# Patient Record
Sex: Female | Born: 1940 | Race: Black or African American | Hispanic: No | Smoking: Former smoker
Health system: Southern US, Community
[De-identification: ages and names within clinical notes are randomized; demographics above are authoritative.]

## PROBLEM LIST (undated history)

## (undated) ENCOUNTER — Emergency Department (HOSPITAL_BASED_OUTPATIENT_CLINIC_OR_DEPARTMENT_OTHER): Admission: EM | Payer: Medicare Other | Source: Home / Self Care

## (undated) DIAGNOSIS — E041 Nontoxic single thyroid nodule: Secondary | ICD-10-CM

## (undated) DIAGNOSIS — M51369 Other intervertebral disc degeneration, lumbar region without mention of lumbar back pain or lower extremity pain: Secondary | ICD-10-CM

## (undated) DIAGNOSIS — E785 Hyperlipidemia, unspecified: Secondary | ICD-10-CM

## (undated) DIAGNOSIS — D169 Benign neoplasm of bone and articular cartilage, unspecified: Secondary | ICD-10-CM

## (undated) DIAGNOSIS — Z780 Asymptomatic menopausal state: Secondary | ICD-10-CM

## (undated) DIAGNOSIS — K2970 Gastritis, unspecified, without bleeding: Secondary | ICD-10-CM

## (undated) DIAGNOSIS — H269 Unspecified cataract: Secondary | ICD-10-CM

## (undated) DIAGNOSIS — K219 Gastro-esophageal reflux disease without esophagitis: Secondary | ICD-10-CM

## (undated) DIAGNOSIS — D329 Benign neoplasm of meninges, unspecified: Secondary | ICD-10-CM

## (undated) DIAGNOSIS — M5136 Other intervertebral disc degeneration, lumbar region: Secondary | ICD-10-CM

## (undated) DIAGNOSIS — T7840XA Allergy, unspecified, initial encounter: Secondary | ICD-10-CM

## (undated) DIAGNOSIS — I10 Essential (primary) hypertension: Secondary | ICD-10-CM

## (undated) HISTORY — PX: BREAST EXCISIONAL BIOPSY: SUR124

## (undated) HISTORY — DX: Benign neoplasm of bone and articular cartilage, unspecified: D16.9

## (undated) HISTORY — DX: Hyperlipidemia, unspecified: E78.5

## (undated) HISTORY — DX: Other intervertebral disc degeneration, lumbar region: M51.36

## (undated) HISTORY — DX: Essential (primary) hypertension: I10

## (undated) HISTORY — DX: Other intervertebral disc degeneration, lumbar region without mention of lumbar back pain or lower extremity pain: M51.369

## (undated) HISTORY — DX: Benign neoplasm of meninges, unspecified: D32.9

## (undated) HISTORY — DX: Asymptomatic menopausal state: Z78.0

## (undated) HISTORY — DX: Nontoxic single thyroid nodule: E04.1

## (undated) HISTORY — DX: Gastro-esophageal reflux disease without esophagitis: K21.9

## (undated) HISTORY — DX: Unspecified cataract: H26.9

## (undated) HISTORY — DX: Allergy, unspecified, initial encounter: T78.40XA

## (undated) HISTORY — PX: TUBAL LIGATION: SHX77

## (undated) HISTORY — PX: BREAST BIOPSY: SHX20

## (undated) HISTORY — DX: Gastritis, unspecified, without bleeding: K29.70

---

## 1967-12-06 HISTORY — PX: BREAST SURGERY: SHX581

## 2000-12-05 DIAGNOSIS — M51369 Other intervertebral disc degeneration, lumbar region without mention of lumbar back pain or lower extremity pain: Secondary | ICD-10-CM

## 2000-12-05 DIAGNOSIS — M5136 Other intervertebral disc degeneration, lumbar region: Secondary | ICD-10-CM

## 2000-12-05 HISTORY — DX: Other intervertebral disc degeneration, lumbar region: M51.36

## 2000-12-05 HISTORY — DX: Other intervertebral disc degeneration, lumbar region without mention of lumbar back pain or lower extremity pain: M51.369

## 2001-11-20 ENCOUNTER — Encounter: Payer: Self-pay | Admitting: Family Medicine

## 2008-02-11 ENCOUNTER — Ambulatory Visit: Payer: Self-pay | Admitting: Family Medicine

## 2008-02-11 DIAGNOSIS — I10 Essential (primary) hypertension: Secondary | ICD-10-CM | POA: Insufficient documentation

## 2008-02-12 ENCOUNTER — Encounter: Payer: Self-pay | Admitting: Family Medicine

## 2008-02-14 ENCOUNTER — Encounter: Payer: Self-pay | Admitting: Family Medicine

## 2008-02-14 LAB — CONVERTED CEMR LAB
AST: 19 units/L (ref 0–37)
CO2: 27 meq/L (ref 19–32)
Chloride: 106 meq/L (ref 96–112)
LDL Cholesterol: 114 mg/dL — ABNORMAL HIGH (ref 0–99)
Potassium: 4.2 meq/L (ref 3.5–5.3)
Sodium: 143 meq/L (ref 135–145)
VLDL: 19 mg/dL (ref 0–40)

## 2008-02-15 ENCOUNTER — Encounter: Payer: Self-pay | Admitting: Family Medicine

## 2008-02-26 ENCOUNTER — Ambulatory Visit: Payer: Self-pay | Admitting: Family Medicine

## 2008-02-26 ENCOUNTER — Encounter: Payer: Self-pay | Admitting: Family Medicine

## 2008-02-26 ENCOUNTER — Other Ambulatory Visit: Admission: RE | Admit: 2008-02-26 | Discharge: 2008-02-26 | Payer: Self-pay | Admitting: Family Medicine

## 2008-02-26 DIAGNOSIS — N951 Menopausal and female climacteric states: Secondary | ICD-10-CM

## 2008-03-07 ENCOUNTER — Encounter: Payer: Self-pay | Admitting: Family Medicine

## 2008-03-07 ENCOUNTER — Encounter: Admission: RE | Admit: 2008-03-07 | Discharge: 2008-03-07 | Payer: Self-pay | Admitting: Family Medicine

## 2008-03-13 ENCOUNTER — Encounter: Payer: Self-pay | Admitting: Family Medicine

## 2008-03-13 DIAGNOSIS — M48061 Spinal stenosis, lumbar region without neurogenic claudication: Secondary | ICD-10-CM | POA: Insufficient documentation

## 2008-03-13 DIAGNOSIS — L2089 Other atopic dermatitis: Secondary | ICD-10-CM

## 2008-03-18 ENCOUNTER — Ambulatory Visit: Payer: Self-pay | Admitting: Family Medicine

## 2008-03-18 DIAGNOSIS — M542 Cervicalgia: Secondary | ICD-10-CM

## 2008-03-19 ENCOUNTER — Telehealth: Payer: Self-pay | Admitting: Family Medicine

## 2008-03-20 DIAGNOSIS — K219 Gastro-esophageal reflux disease without esophagitis: Secondary | ICD-10-CM

## 2008-03-27 ENCOUNTER — Telehealth (INDEPENDENT_AMBULATORY_CARE_PROVIDER_SITE_OTHER): Payer: Self-pay | Admitting: *Deleted

## 2008-03-27 ENCOUNTER — Encounter: Payer: Self-pay | Admitting: Family Medicine

## 2008-03-28 ENCOUNTER — Ambulatory Visit: Payer: Self-pay | Admitting: Family Medicine

## 2008-03-28 DIAGNOSIS — H8110 Benign paroxysmal vertigo, unspecified ear: Secondary | ICD-10-CM | POA: Insufficient documentation

## 2008-04-03 ENCOUNTER — Telehealth (INDEPENDENT_AMBULATORY_CARE_PROVIDER_SITE_OTHER): Payer: Self-pay | Admitting: *Deleted

## 2008-05-16 ENCOUNTER — Ambulatory Visit: Payer: Self-pay | Admitting: Family Medicine

## 2008-06-02 ENCOUNTER — Encounter: Admission: RE | Admit: 2008-06-02 | Discharge: 2008-06-02 | Payer: Self-pay | Admitting: Orthopedic Surgery

## 2008-06-13 ENCOUNTER — Encounter: Payer: Self-pay | Admitting: Family Medicine

## 2008-06-16 LAB — CONVERTED CEMR LAB
BUN: 14 mg/dL (ref 6–23)
Calcium: 9.6 mg/dL (ref 8.4–10.5)
Chloride: 107 meq/L (ref 96–112)
Creatinine, Ser: 0.79 mg/dL (ref 0.40–1.20)
Potassium: 4.3 meq/L (ref 3.5–5.3)
Sodium: 143 meq/L (ref 135–145)

## 2008-09-17 ENCOUNTER — Ambulatory Visit: Payer: Self-pay | Admitting: Family Medicine

## 2008-09-26 ENCOUNTER — Telehealth: Payer: Self-pay | Admitting: Family Medicine

## 2008-10-09 ENCOUNTER — Telehealth: Payer: Self-pay | Admitting: Family Medicine

## 2008-12-05 DIAGNOSIS — D329 Benign neoplasm of meninges, unspecified: Secondary | ICD-10-CM

## 2008-12-05 HISTORY — DX: Benign neoplasm of meninges, unspecified: D32.9

## 2009-02-10 ENCOUNTER — Telehealth: Payer: Self-pay | Admitting: Family Medicine

## 2009-02-26 ENCOUNTER — Ambulatory Visit: Payer: Self-pay | Admitting: Family Medicine

## 2009-03-03 ENCOUNTER — Telehealth: Payer: Self-pay | Admitting: Family Medicine

## 2009-03-03 ENCOUNTER — Encounter: Payer: Self-pay | Admitting: Family Medicine

## 2009-03-03 DIAGNOSIS — D320 Benign neoplasm of cerebral meninges: Secondary | ICD-10-CM

## 2009-03-03 LAB — CONVERTED CEMR LAB
ALT: 13 units/L (ref 0–35)
Creatinine, Ser: 0.86 mg/dL (ref 0.40–1.20)
HCT: 37.4 % (ref 36.0–46.0)
Hemoglobin: 12.8 g/dL (ref 12.0–15.0)
LDL Cholesterol: 125 mg/dL — ABNORMAL HIGH (ref 0–99)
Platelets: 267 10*3/uL (ref 150–400)
Potassium: 4.6 meq/L (ref 3.5–5.3)
RDW: 13.1 % (ref 11.5–15.5)
Sodium: 142 meq/L (ref 135–145)
Total CHOL/HDL Ratio: 4
WBC: 6.6 10*3/uL (ref 4.0–10.5)

## 2009-03-09 ENCOUNTER — Encounter: Payer: Self-pay | Admitting: Family Medicine

## 2009-03-09 ENCOUNTER — Encounter: Admission: RE | Admit: 2009-03-09 | Discharge: 2009-03-09 | Payer: Self-pay | Admitting: Family Medicine

## 2009-03-17 ENCOUNTER — Telehealth: Payer: Self-pay | Admitting: Family Medicine

## 2009-03-21 ENCOUNTER — Encounter: Admission: RE | Admit: 2009-03-21 | Discharge: 2009-03-21 | Payer: Self-pay | Admitting: Family Medicine

## 2009-03-24 ENCOUNTER — Telehealth: Payer: Self-pay | Admitting: Family Medicine

## 2009-03-25 ENCOUNTER — Encounter: Payer: Self-pay | Admitting: Family Medicine

## 2009-04-03 ENCOUNTER — Ambulatory Visit: Payer: Self-pay | Admitting: Diagnostic Radiology

## 2009-04-03 ENCOUNTER — Ambulatory Visit: Payer: Self-pay | Admitting: Family Medicine

## 2009-04-03 ENCOUNTER — Observation Stay (HOSPITAL_COMMUNITY): Admission: EM | Admit: 2009-04-03 | Discharge: 2009-04-04 | Payer: Self-pay | Admitting: Internal Medicine

## 2009-04-03 ENCOUNTER — Encounter: Payer: Self-pay | Admitting: Emergency Medicine

## 2009-04-03 ENCOUNTER — Ambulatory Visit: Payer: Self-pay | Admitting: Internal Medicine

## 2009-05-12 ENCOUNTER — Telehealth (INDEPENDENT_AMBULATORY_CARE_PROVIDER_SITE_OTHER): Payer: Self-pay | Admitting: Radiology

## 2009-05-13 ENCOUNTER — Encounter: Payer: Self-pay | Admitting: Internal Medicine

## 2009-05-13 ENCOUNTER — Ambulatory Visit: Payer: Self-pay

## 2009-05-22 ENCOUNTER — Encounter (INDEPENDENT_AMBULATORY_CARE_PROVIDER_SITE_OTHER): Payer: Self-pay | Admitting: *Deleted

## 2009-07-10 ENCOUNTER — Telehealth (INDEPENDENT_AMBULATORY_CARE_PROVIDER_SITE_OTHER): Payer: Self-pay | Admitting: *Deleted

## 2009-07-15 ENCOUNTER — Encounter: Admission: RE | Admit: 2009-07-15 | Discharge: 2009-07-15 | Payer: Self-pay | Admitting: Family Medicine

## 2009-08-14 ENCOUNTER — Ambulatory Visit: Payer: Self-pay | Admitting: Family Medicine

## 2009-10-16 ENCOUNTER — Ambulatory Visit: Payer: Self-pay | Admitting: Family Medicine

## 2009-11-20 ENCOUNTER — Ambulatory Visit: Payer: Self-pay | Admitting: Family Medicine

## 2009-12-28 ENCOUNTER — Telehealth (INDEPENDENT_AMBULATORY_CARE_PROVIDER_SITE_OTHER): Payer: Self-pay | Admitting: *Deleted

## 2010-02-19 ENCOUNTER — Ambulatory Visit: Payer: Self-pay | Admitting: Family Medicine

## 2010-02-22 ENCOUNTER — Encounter: Payer: Self-pay | Admitting: Family Medicine

## 2010-02-23 LAB — CONVERTED CEMR LAB
ALT: 13 units/L (ref 0–35)
AST: 19 units/L (ref 0–37)
Calcium: 9.4 mg/dL (ref 8.4–10.5)
Chloride: 106 meq/L (ref 96–112)
Cholesterol: 210 mg/dL — ABNORMAL HIGH (ref 0–200)
Creatinine, Ser: 0.87 mg/dL (ref 0.40–1.20)
Glucose, Bld: 87 mg/dL (ref 70–99)
Hemoglobin: 13.2 g/dL (ref 12.0–15.0)
LDL Cholesterol: 143 mg/dL — ABNORMAL HIGH (ref 0–99)
Platelets: 290 10*3/uL (ref 150–400)
RDW: 12.7 % (ref 11.5–15.5)
Sodium: 141 meq/L (ref 135–145)
Total Protein: 6.9 g/dL (ref 6.0–8.3)
WBC: 6.8 10*3/uL (ref 4.0–10.5)

## 2010-03-04 ENCOUNTER — Encounter: Payer: Self-pay | Admitting: Family Medicine

## 2010-03-05 ENCOUNTER — Encounter: Payer: Self-pay | Admitting: Family Medicine

## 2010-03-05 ENCOUNTER — Ambulatory Visit: Payer: Self-pay

## 2010-03-08 ENCOUNTER — Telehealth: Payer: Self-pay | Admitting: Family Medicine

## 2010-03-15 ENCOUNTER — Encounter: Admission: RE | Admit: 2010-03-15 | Discharge: 2010-03-15 | Payer: Self-pay | Admitting: Family Medicine

## 2010-05-10 ENCOUNTER — Ambulatory Visit: Payer: Self-pay | Admitting: Family Medicine

## 2010-05-10 DIAGNOSIS — E785 Hyperlipidemia, unspecified: Secondary | ICD-10-CM

## 2010-07-09 ENCOUNTER — Ambulatory Visit: Payer: Self-pay | Admitting: Family Medicine

## 2010-07-12 LAB — CONVERTED CEMR LAB
HDL: 38 mg/dL — ABNORMAL LOW (ref >39)
LDL Cholesterol: 145 mg/dL — ABNORMAL HIGH (ref 0–99)
Total CHOL/HDL Ratio: 5.3
Triglycerides: 87 mg/dL (ref <150)
VLDL: 17 mg/dL (ref 0–40)

## 2010-07-16 ENCOUNTER — Ambulatory Visit: Payer: Self-pay | Admitting: Family Medicine

## 2010-07-16 DIAGNOSIS — E041 Nontoxic single thyroid nodule: Secondary | ICD-10-CM

## 2010-07-22 ENCOUNTER — Encounter: Admission: RE | Admit: 2010-07-22 | Discharge: 2010-07-22 | Payer: Self-pay | Admitting: Family Medicine

## 2010-07-23 ENCOUNTER — Encounter (INDEPENDENT_AMBULATORY_CARE_PROVIDER_SITE_OTHER): Payer: Self-pay | Admitting: *Deleted

## 2010-07-23 DIAGNOSIS — E042 Nontoxic multinodular goiter: Secondary | ICD-10-CM | POA: Insufficient documentation

## 2010-08-02 ENCOUNTER — Encounter: Payer: Self-pay | Admitting: Family Medicine

## 2010-09-01 ENCOUNTER — Telehealth: Payer: Self-pay | Admitting: Family Medicine

## 2010-09-03 ENCOUNTER — Ambulatory Visit: Payer: Self-pay | Admitting: Family Medicine

## 2010-09-03 DIAGNOSIS — J309 Allergic rhinitis, unspecified: Secondary | ICD-10-CM | POA: Insufficient documentation

## 2010-09-03 DIAGNOSIS — J0100 Acute maxillary sinusitis, unspecified: Secondary | ICD-10-CM

## 2010-09-24 ENCOUNTER — Ambulatory Visit: Payer: Self-pay | Admitting: Family Medicine

## 2010-10-25 ENCOUNTER — Ambulatory Visit: Payer: Self-pay | Admitting: Family Medicine

## 2010-12-27 ENCOUNTER — Encounter: Payer: Self-pay | Admitting: Family Medicine

## 2011-01-04 NOTE — Progress Notes (Signed)
Summary: Simvastatin SEs  Phone Note Call from Patient   Caller: Patient Summary of Call: Pt states she is unable to continue Simvastatin bc it is causing HA, nausea, constipation. Pt would like to know if she can try natural treatment before trying another Rx. Please advise. Initial call taken by: Payton Spark CMA,  March 08, 2010 9:59 AM  Follow-up for Phone Call        She can try 4 grams of Omega 3 Fish Oil daily. Let's recheck LDL in 3 mos.  If not <130, she will need to try a different Rx. Follow-up by: Seymour Bars DO,  March 08, 2010 10:11 AM     Appended Document: Simvastatin SEs pt aware

## 2011-01-04 NOTE — Miscellaneous (Signed)
Summary: Orders Update  Clinical Lists Changes  Orders: Added new Test order of Carotid Duplex (Carotid Duplex) - Signed 

## 2011-01-04 NOTE — Consult Note (Signed)
Summary: [facility]  [facility]   Imported By: Lanelle Bal 08/11/2010 09:43:00  _____________________________________________________________________  External Attachment:    Type:   Image     Comment:   External Document

## 2011-01-04 NOTE — Assessment & Plan Note (Signed)
Summary: f/u HTN   Vital Signs:  Patient profile:   70 year old female Height:      64 inches Weight:      153 pounds BMI:     26.36 O2 Sat:      100 % on Room air Pulse rate:   60 / minute BP sitting:   150 / 77  (left arm) Cuff size:   regular  Vitals Entered By: Payton Spark CMA (February 19, 2010 10:47 AM)  O2 Flow:  Room air CC: F/U HTN   Primary Care Provider:  Seymour Bars DO  CC:  F/U HTN.  History of Present Illness: Tina Medina is a 70 year old woman here for f/u of back pain. She had an achey pain in her right neck for about 1 month that went away last week. It was continuous all day and worse with movement. She has also had a sharp, stabbing pain in her right side that comes and goes over the past year. It happens about once a month for a few hours at a time. Couldn't use the bathroom for a few days about a week ago, but now is having regular bowel movements once a day. She has wrist pain that is worse in the morning and gets better throughout the day. It bothers her with pronation and supination. About a week ago she has sharp pain in her left temple that lasted for just a second. Her eyes started tearing up. She did not have any change in vision. No trouble chewing.  Her back pain has been stable. She does exercises every morning and evening. She takes her diclofenac about once a month when she is very active which does control her pain well. No leg weakness or bowel/bladder incontinence.   Her BP today is 150/77. She is taking atenolol every day and Hyzaar about every other day because it makes her have to go to the bathroom. She did not take it today. No chest pain, but she did have an episode last week when she was going through a stressful situation and had a hard time breathing.   Current Medications (verified): 1)  Hyzaar 100-12.5 Mg Tabs (Losartan Potassium-Hctz) .[name].. 1 Tab By Mouth Daily  Allergic To Ace Inhbitors 2)  Baby Aspirin 81 Mg  Chew (Aspirin) .... Take 1  Tablet By Mouth Once A Day 3)  Multivitamins   Tabs (Multiple Vitamin) .... Take 1 Tablet By Mouth Once A Day 4)  Caltrate 600+d 600-400 Mg-Unit  Tabs (Calcium Carbonate-Vitamin D) .... Take 1 Tablet By Mouth Once A Day 5)  Diclofenac Sodium 50 Mg  Tbec (Diclofenac Sodium) .[name].. 1 Tab By Mouth Two Times A Day With Food As Needed For Back Pain 6)  Atenolol 50 Mg Tabs (Atenolol) .[name].. 1 Tab By Mouth Daily  Allergies (verified): 1)  ! Pcn 2)  Ace Inhibitors  Physical Exam  General:  Pleasant well-appearing female in no acute distress.  Head:  Normocephalic and atraumatic.  Eyes:  Sclera clear with no corneal or conjunctival inflammation noted. Wears glasses.  Mouth:  Oropharynx clear with moist mucus membranes. Neck:  Limited lateral twisting, full flexion and extension. Reproducible pain with R lateral bending.  Lungs:  Normal respiratory effort, chest expands symmetrically. Lungs are clear to auscultation, no crackles or wheezes. Heart:  Regular rate and rhythm with normal S1 and S2. No appreciable murmurs. L carotid bruit. No temporal bruits.  Abdomen:  Soft, nontender, and nondistended. No rebound or guarding. Normoactive bowel  sounds.  Msk:  L wrist: full range of motion and 5/5 strength, no swelling visible. R wrist normal.  Pulses:  2+ radial pulses bilaterally. Skin:  Mild dry skin on back.  Psych:  Interacting appropriately, not anxious or depressed-appearing.    Impression & Recommendations:  Problem # 1:  ESSENTIAL HYPERTENSION, BENIGN (ICD-401.1) BP today 150/77. Will split Hyzaar into losartan and HCTZ to improve compliance. Pt agrees to take losartan every day as long as she does not have to take HCTZ when she does not have easy access to a bathroom. Continue atenolol. Will order fasting labs.   Her updated medication list for this problem includes:    Losartan Potassium 100 Mg Tabs (Losartan potassium) .[name]... 1 tab by mouth once daily    Atenolol 50 Mg Tabs (Atenolol) .[name]...  1 tab by mouth daily    Hydrochlorothiazide 25 Mg Tabs (Hydrochlorothiazide) .[name]... 1 tab by mouth daily  Orders: T-Comprehensive Metabolic Panel ([id number])  Problem # 2:  CAROTID BRUIT (ICD-785.9) L carotid bruit appreciated on exam. Will schedule carotid dopplers.   Orders: T-Vascular Study-Carotids Complete [id number])  Problem # 3:  SPINAL STENOSIS, LUMBAR (ICD-724.02) Stable. Continue diclofenac as needed for pain and begin nortriptyline for nerve pain as well as stress management. Call immediately if experiencing worsening pain, leg weakness, bowel/bladder incontinence, or other worsening symptoms.   Her updated medication list for this problem includes:    Diclofenac Sodium 50 Mg Tbec (Diclofenac sodium) .[name]... 1 tab by mouth two times a day with food as needed for back pain    Nortriptyline Hcl 25 Mg Caps (Nortriptyline hcl) .[name].. 1 capsule by mouth at bedtime    Problem # 4:  ARTHRALGIA (ICD-719.40) Neck pain and wrist pain likely 2/2 arthritis. Neck pain could be related to spinal stenosis extending to cervical spine. Will check rheumatoid factor given morning wrist pain and swelling that improves throughout the day.   Her updated medication list for this problem includes:    Diclofenac Sodium 50 Mg Tbec (Diclofenac sodium) .[name]... 1 tab by mouth two times a day with food as needed for back pain  Complete Medication List: 1)  Losartan Potassium 100 Mg Tabs (Losartan potassium) .[name].. 1 tab by mouth once daily 2)  Baby Aspirin 81 Mg Chew (Aspirin) .... Take 1 tablet by mouth once a day 3)  Multivitamins Tabs (Multiple vitamin) .... Take 1 tablet by mouth once a day 4)  Caltrate 600+d 600-400 Mg-unit Tabs (Calcium carbonate-vitamin d) .... Take 1 tablet by mouth once a day 5)  Diclofenac Sodium 50 Mg Tbec (Diclofenac sodium) .[name].. 1 tab by mouth two times a day with food as needed for back pain 6)  Atenolol 50 Mg Tabs (Atenolol) .[name].. 1 tab by mouth daily 7)  Hydrochlorothiazide 25 Mg  Tabs (Hydrochlorothiazide) .[name].. 1 tab by mouth daily 8)  Nortriptyline Hcl 25 Mg Caps (Nortriptyline hcl) .[name].. 1 capsule by mouth qhs  Other Orders: T-CBC No Diff ([id number]) T-Lipid Profile ([id number]) T-Rheumatoid Factor ([id number])  Patient Instructions: 1)  Split up HYZAAR to Losartan + HCTZ. 2)  Have fasting labs drawn. 3)  Will call you w/ results. 4)  Will get carotid dopplers set up. 5)  Restart Nortriptyline at night. 6)  Return for f/u BP/ mood/ spinal stenosis in 3 mos. Prescriptions: HYDROCHLOROTHIAZIDE 25 MG TABS (HYDROCHLOROTHIAZIDE) 1 tab by mouth daily  #90 x 1   Entered and Authorized by:   Seymour Bars DO   Signed by:   Seymour Bars  DO on 02/19/2010   Method used:   Electronically to        [facility]* (mail-order)             ,          Ph: [phone]       Fax: [phone]   RxID:   [id number] LOSARTAN POTASSIUM 100 MG TABS (LOSARTAN POTASSIUM) 1 tab by mouth once daily  #90 x 1   Entered and Authorized by:   Seymour Bars DO   Signed by:   Seymour Bars DO on 02/19/2010   Method used:   Electronically to        [facility]* (mail-order)             ,          Ph: [phone]       Fax: [phone]   RxID:   [id number] HYDROCHLOROTHIAZIDE 25 MG TABS (HYDROCHLOROTHIAZIDE) 1 tab by mouth daily  #30 x 6   Entered and Authorized by:   Seymour Bars DO   Signed by:   Seymour Bars DO on 02/19/2010   Method used:   Electronically to        [facility] [phone]* (retail)       [address] [place], [place]  [address]       Ph: [phone]       Fax: [phone]   RxID:   [id number] LOSARTAN POTASSIUM 100 MG TABS (LOSARTAN POTASSIUM) 1 tab by mouth once daily  #30 x 5   Entered and Authorized by:   Seymour Bars DO   Signed by:   Seymour Bars DO on 02/19/2010   Method used:   Electronically to        [facility] [phone]* (retail)       [address] [place], [place]  [address]       Ph: [phone]       Fax:  [phone]   RxID:   [id number]

## 2011-01-04 NOTE — Letter (Signed)
Summary: Primary Care Consult Scheduled Letter  [facility]  [address], [address]   [place], [place] [address]   Phone: [phone]  Fax: [phone]      07/23/2010 MRN: [id number]  Tina Medina [address] [name], [place]  [address]    Dear [name],    We have scheduled an appointment for you.  At the recommendation of Dr.Bowen, we have scheduled you a consult with [facility] -Dr.Lambeth on Tuesday September 27th at 9:00AM.  Their address is [address] [place], [address]. The office phone number is [phone].  If this appointment day and time is not convenient for you, please feel free to call the office of the doctor you are being referred to at the number listed above and reschedule the appointment.     It is important for you to keep your scheduled appointments. We are here to make sure you are given good patient care.    Thank you, Michaelle Copas, [phone] Patient Care Coordinator [facility] [place]

## 2011-01-04 NOTE — Progress Notes (Signed)
Summary: Sinuses  Phone Note Call from Patient Call back at Home Phone [phone]   Caller: Patient Call For: Tina Bars DO Summary of Call: Pt calls and states seen her dentist yesterday and when they done xrays said looked like something wrong with her sinuses. She wants to know if you can refer her to ENT for this or do you need to see her first Initial call taken by: Kathlene November LPN,  September 01, 2010 9:00 AM  Follow-up for Phone Call        since i have not recently seen her for sinus issues, she needs to see me first. Follow-up by: Tina Bars DO,  September 01, 2010 9:06 AM

## 2011-01-04 NOTE — Progress Notes (Signed)
Summary: Pt requested call  Phone Note Call from Patient   Caller: Patient Summary of Call: Pt requested a call to find out if she had an OV on 04/10/09. I called Pt back to inform her that she had NOT been seen here on that date but I didn't get an answer and no machine to LM. Will try back later.  Initial call taken by: Payton Spark CMA,  December 28, 2009 11:11 AM     Appended Document: Pt requested call Pt aware

## 2011-01-04 NOTE — Assessment & Plan Note (Signed)
Summary: 4 week f/u on BP-jr  Nurse Visit   Vital Signs:  Patient profile:   70 year old female Pulse rate:   56 / minute BP sitting:   150 / 82  (left arm) Cuff size:   regular  Vitals Entered By: Kathlene November LPN (October 25, 2010 10:49 AM) CC: Follow-up HTN HPI: Taking Meds?yes Side Effects? when takes the Micardis with the Atenolol- pt states has chest pain Chest Pain, SOB, Dizziness? chest pain when takes  2 meds together A/P: HTN(401.1) At Goal? If no, MD will be notified. Follow-up in--  5 minutes was spent with the pt.  Allergies: 1)  ! Pcn 2)  Ace Inhibitors  Orders Added: 1)  Est. Patient Level I [[address]]    Impression & Recommendations:  Problem # 1:  ESSENTIAL HYPERTENSION, BENIGN (ICD-401.1) BP still high.  I'd like her to increase to a full tab of Micardis 80 mg daily -- take this in the morning with HCTZ 25 mg.  Take ATenolol at bedtime.  Return for an office visit in 2 wks.  Her updated medication list for this problem includes:    Hydrochlorothiazide 25 Mg Tabs (Hydrochlorothiazide) .[name]... 1 tab by mouth daily    Atenolol 100 Mg Tabs (Atenolol) .[name]... 1 tab by mouth daily    Micardis 80 Mg Tabs (Telmisartan) .[name]... 1/2 tab by mouth daily  BP today: 150/82 Prior BP: 153/75 (09/24/2010)  Prior 10 Yr Risk Heart Disease: 17 % (05/16/2008)  Labs Reviewed: K+: 4.2 (02/22/2010) Creat: : 0.87 (02/22/2010)   Chol: 200 (07/09/2010)   HDL: 38 (07/09/2010)   LDL: 145 (07/09/2010)   TG: 87 (07/09/2010)  Complete Medication List: 1)  Baby Aspirin 81 Mg Chew (Aspirin) .... Take 1 tablet by mouth once a day 2)  Multivitamins Tabs (Multiple vitamin) .... Take 1 tablet by mouth once a day 3)  Hydrochlorothiazide 25 Mg Tabs (Hydrochlorothiazide) .[name].. 1 tab by mouth daily 4)  Nortriptyline Hcl 25 Mg Caps (Nortriptyline hcl) .[name].. 1 capsule by mouth qhs 5)  Atenolol 100 Mg Tabs (Atenolol) .[name].. 1 tab by mouth daily 6)  Crestor 10 Mg Tabs (Rosuvastatin calcium) ....  1/2 tab by mouth every other night 7)  Zyrtec Allergy 10 Mg Caps (Cetirizine hcl) .[name].. 1 tab by mouth qpm 8)  Micardis 80 Mg Tabs (Telmisartan) .... 1/2 tab by mouth daily

## 2011-01-04 NOTE — Assessment & Plan Note (Signed)
Summary: thyroid nodule   Vital Signs:  Patient profile:   70 year old female Height:      64 inches Weight:      151 pounds BMI:     26.01 O2 Sat:      99 % on Room air Temp:     98.6 degrees F oral Pulse rate:   51 / minute BP sitting:   135 / 73  (left arm) Cuff size:   regular  Vitals Entered By: Payton Spark CMA (July 16, 2010 10:49 AM)  O2 Flow:  Room air CC: Discomfort under chin x 2 days   Primary Care Provider:  Seymour Bars DO  CC:  Discomfort under chin x 2 days.  History of Present Illness: 70 yo AAF presents for a 'lump' on the front of her neck that she noticed about 3 days ago.  She has not a sore throat.  She has not had a fevers or chills.  She denies any pain to this region.  Denies dysphagia or regurgitation of food.  Denies any voice hoarseness.  Quit smoking years ago.  Food and pills are not getting stuck.    Allergies: 1)  ! Pcn 2)  Ace Inhibitors  Past History:  Past Medical History: Reviewed history from 11/20/2009 and no changes required. 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 [id number] (1 daughter died of AIDS) DDD L spine, spinal stenosis hx of migraine recurrent H pylori gastritis ACEi cough stress test 05-2009  Social History: Reviewed history from 03/28/2008 and no changes required. 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 Moved from [facility] 2009. 2 daugthers, 13 grandkids in [place]. Quit smoking 40 yrs ago. Denies ETOH. Little exercise. Good diet. Mom is now living in [place].  Review of Systems      See HPI  Physical Exam  General:  alert, well-developed, well-nourished, and well-hydrated.   Head:  normocephalic and atraumatic.   Eyes:  no proptosis Mouth:  pharynx pink and moist.  able to swallow on command.  no hoarsenss Neck:  supple.  superior pole of the  thyroid gland has a slightly enlarged nodule that is very tender.  No heat, bruit or redness noted. Lungs:  Normal respiratory effort, chest expands symmetrically. Lungs are clear to auscultation, no crackles or wheezes. Heart:  Regular rate and rhythm with normal S1 and S2. No appreciable murmurs. L carotid bruit. No temporal bruits.  Extremities:  no LE edema Skin:  color normal.   Cervical Nodes:  No lymphadenopathy noted Psych:  flat affect.     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  Add TSH, Free T3 and Free T4 in the next wk.   Orders: T-Thyroid imaging w/ flow ([id number])  Problem # 2:  ESSENTIAL HYPERTENSION, BENIGN (ICD-401.1) BP at goal!  Continue current meds. Her updated medication list for this problem includes:    Losartan Potassium 100 Mg Tabs (Losartan potassium) .[name]... 1/2 tab by mouth bid    Hydrochlorothiazide 25 Mg Tabs (Hydrochlorothiazide) .[name]... 1 tab by mouth daily    Atenolol 100 Mg Tabs (Atenolol) .[name]... 1 tab by mouth daily  BP today: 135/73 Prior BP: 137/70 (07/09/2010)  Prior 10 Yr Risk Heart Disease: 17 % (05/16/2008)  Labs Reviewed: K+: 4.2 (02/22/2010) Creat: : 0.87 (02/22/2010)   Chol: 200 (07/09/2010)   HDL: 38 (07/09/2010)   LDL: 145 (07/09/2010)   TG: 87 (07/09/2010)  Complete Medication List: 1)  Losartan Potassium 100 Mg Tabs (Losartan potassium) .... 1/2 tab by mouth bid 2)  Baby Aspirin 81 Mg Chew (Aspirin) .... Take 1 tablet by mouth once a day 3)  Multivitamins Tabs (Multiple vitamin) .... Take 1 tablet by mouth once a day 4)  Caltrate 600+d 600-400 Mg-unit Tabs (Calcium carbonate-vitamin d) .... Take 1 tablet by mouth once a day 5)  Diclofenac Sodium 50 Mg Tbec (Diclofenac sodium) .[name].. 1 tab by mouth two times a day with food as needed for back pain 6)  Hydrochlorothiazide 25 Mg Tabs  (Hydrochlorothiazide) .[name].. 1 tab by mouth daily 7)  Nortriptyline Hcl 25 Mg Caps (Nortriptyline hcl) .[name].. 1 capsule by mouth qhs 8)  Atenolol 100 Mg Tabs (Atenolol) .[name].. 1 tab by mouth daily 9)  Crestor 10 Mg Tabs (Rosuvastatin calcium) .... 1/2 tab by mouth every other night  Patient Instructions: 1)  Will set up thyroid ultrasound for next wk. 2)  Will call you w/ results. 3)  I will see if the labs can add on Thyroid labs to your 8/5 labs. 4)  Will let you know if you need any more blood drawn. 5)  Eat a high fiber diet - lots of fruits/ veggies, water. 6)  Use Miralax once a day for 3 days for constipation.

## 2011-01-04 NOTE — Assessment & Plan Note (Signed)
Summary: lumbar stenosis   Vital Signs:  Patient profile:   70 year old female Height:      64 inches Weight:      154 pounds Pulse rate:   55 / minute BP sitting:   153 / 75  (left arm) Cuff size:   regular  Vitals Entered By: Kathlene November LPN (September 24, 2010 11:05 AM) CC: lower left side back pain x 3 months- gets worse in the evenings   Primary Care Kyo Cocuzza:  Seymour Bars DO  CC:  lower left side back pain x 3 months- gets worse in the evenings.  History of Present Illness: 70 yo AAF with a long hx of lumbar stenosis presents for continued LBP with radiation into the L leg, sometimes with paresthesias but no numbness, weakness or change in bowel or bladder habits.   Her last MRI was in 2002, in [place].  She has declined PT due to cost and has refused to take Nortriptyline every night (instead taking it as needed and says that it does help).  She is not taking the NSAIDs that I have given her nor OTC meds.  She has declined looking into LESIs for pain relief.    She has mild pain most days that gets worse in the late afternoon/ evenings and is worse after she pushes her mom in the wheelchair.  She is not exercising and is gaining wt.    Current Medications (verified): 1)  Baby Aspirin 81 Mg  Chew (Aspirin) .... Take 1 Tablet By Mouth Once A Day 2)  Multivitamins   Tabs (Multiple Vitamin) .... Take 1 Tablet By Mouth Once A Day 3)  Diclofenac Sodium 50 Mg  Tbec (Diclofenac Sodium) .[name].. 1 Tab By Mouth Two Times A Day With Food As Needed For Back Pain 4)  Hydrochlorothiazide 25 Mg Tabs (Hydrochlorothiazide) .[name].. 1 Tab By Mouth Daily 5)  Nortriptyline Hcl 25 Mg Caps (Nortriptyline Hcl) .[name].. 1 Capsule By Mouth Qhs 6)  Atenolol 100 Mg Tabs (Atenolol) .[name].. 1 Tab By Mouth Daily 7)  Crestor 10 Mg Tabs (Rosuvastatin Calcium) .... 1/2 Tab By Mouth Every Other Night 8)  Zyrtec Allergy 10 Mg Caps (Cetirizine Hcl) .[name].. 1 Tab By Mouth Qpm  Allergies (verified): 1)  ! Pcn 2)  Ace  Inhibitors  Comments:  Nurse/Medical Assistant: The patient's medications and allergies were reviewed with the patient and were updated in the Medication and Allergy Lists. Kathlene November LPN (September 24, 2010 11:06 AM)  Past History:  Past Medical History: Reviewed history from 11/20/2009 and no changes required. 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 [id number] (1 daughter died of AIDS) DDD L spine, spinal stenosis hx of migraine recurrent H pylori gastritis ACEi cough stress test 05-2009  Social History: Reviewed history from 03/28/2008 and no changes required. 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 Moved from [facility] 2009. 2 daugthers, 13 grandkids in [place]. Quit smoking 40 yrs ago. Denies ETOH. Little exercise. Good diet. Mom is now living in [place].  Review of Systems      See HPI  Physical Exam  General:  alert, well-developed, well-nourished, and well-hydrated.   Head:  normocephalic and atraumatic.   Msk:  pain with lumbar extension, full flexion.  No sciatic notch tenderness or vertebral body tenderness.  + L sided  seated leg raise. Pulses:  2+ pedal pulses Extremities:  trace LE edema bilat Neurologic:  strength normal in all extremities, sensation intact to light touch, gait normal, and DTRs symmetrical and normal.     Impression & Recommendations:  Problem # 1:  SPINAL STENOSIS, LUMBAR (ICD-724.02) Assessment Unchanged One reason her LBP/ radicular L leg pain is not improving is because she has been non adherent to meds/ therapy/ exercise.  She has no red flags to warrant re- imaging, last MRI in 02.  She has also declined looking into LESIs.   She agrees to taking her Nortriptyline every night for nerve root pain and will use either diclofenac or tylenol arthritis for rescue. Since PT was too  costly, [facility]  She has a free membership thru [employer].  Explained that regular exercise and wt loss would be helpful as part of her managment of chronic pain.  She agrees to this plan.  Problem # 2:  ESSENTIAL HYPERTENSION, BENIGN (ICD-401.1) BP high b/c she was having abd cramps from Losartan.  Will have her stop this and replace with sample of Micardis. RTC for nurse BP check in 4 wks. The following medications were removed from the medication list:    Losartan Potassium 100 Mg Tabs (Losartan potassium) .[name]... 1/2 tab by mouth bid Her updated medication list for this problem includes:    Hydrochlorothiazide 25 Mg Tabs (Hydrochlorothiazide) .[name]... 1 tab by mouth daily    Atenolol 100 Mg Tabs (Atenolol) .[name]... 1 tab by mouth daily    Micardis 80 Mg Tabs (Telmisartan) .[name]... 1/2 tab by mouth daily  BP today: 153/75 Prior BP: 150/77 (09/03/2010)  Prior 10 Yr Risk Heart Disease: 17 % (05/16/2008)  Labs Reviewed: K+: 4.2 (02/22/2010) Creat: : 0.87 (02/22/2010)   Chol: 200 (07/09/2010)   HDL: 38 (07/09/2010)   LDL: 145 (07/09/2010)   TG: 87 (07/09/2010)  Complete Medication List: 1)  Baby Aspirin 81 Mg Chew (Aspirin) .... Take 1 tablet by mouth once a day 2)  Multivitamins Tabs (Multiple vitamin) .... Take 1 tablet by mouth once a day 3)  Hydrochlorothiazide 25 Mg Tabs (Hydrochlorothiazide) .[name].. 1 tab by mouth daily 4)  Nortriptyline Hcl 25 Mg Caps (Nortriptyline hcl) .[name].. 1 capsule by mouth qhs 5)  Atenolol 100 Mg Tabs (Atenolol) .[name].. 1 tab by mouth daily 6)  Crestor 10 Mg Tabs (Rosuvastatin calcium) .... 1/2 tab by mouth every other night 7)  Zyrtec Allergy 10 Mg Caps (Cetirizine hcl) .[name].. 1 tab by mouth qpm 8)  Micardis 80 Mg Tabs (Telmisartan) .... 1/2 tab by mouth daily  Patient Instructions: 1)  For lumbar stenosis: 2)  Take Nortriptyline every evening. 3)  Use Diclofenac or Tylenol arthrits for rescue pain. 4)  Let's try to get you in  with water aerobics 3 x a wk. 5)  Change Losartan to Micardis, 1/2 tab daily. 6)  REturn for a nurse BP check in 4 wks. Prescriptions: NORTRIPTYLINE HCL 25 MG CAPS (NORTRIPTYLINE HCL) 1 capsule by mouth qhs  #30 x 3   Entered and Authorized by:   Seymour Bars DO   Signed by:   Seymour Bars DO on 09/24/2010   Method used:   Electronically to        [facility] [phone]* (retail)       [address] [place], [place]  [address]       Ph: [phone]  Fax: [phone]   RxID:   [id number]    Orders Added: 1)  Est. Patient Level III [56433]

## 2011-01-04 NOTE — Assessment & Plan Note (Signed)
Summary: f/u HTN/ high chol   Vital Signs:  Patient profile:   70 year old female Height:      64 inches Weight:      152 pounds BMI:     26.19 O2 Sat:      100 % on Room air Pulse rate:   68 / minute BP sitting:   153 / 85  (left arm) Cuff size:   regular  Vitals Entered By: Payton Spark CMA (May 10, 2010 11:13 AM)  O2 Flow:  Room air CC: F/U back pain and HTN.    Primary Care Provider:  Seymour Bars DO  CC:  F/U back pain and HTN. [name]  History of Present Illness: 70 yo AAF presents for f/u.  1.  For her HTN, she has been non adherent to her regimen of Losartan + Atenolol + HCTZ.  She c/o frequent urination from her HCTZ, she simply is not tkaing her ATenolol and her Losartan she thinks makes her 'feel bad'.  She has not had CP or DOE.  Luckily, her carotid doppler u/s came back normal.  Denies leg swelling or blurry vision.  2.  For her high chol, she stopped the Simvastatin due to SEs.  She changed to Omega 3 Fish Oil about 6 wks ago.  She is taking about 3 grams/ day.  Admits to eating junk food.  Her LDL has increased over the past 3 yrs 114-->125-->142 with a <130 goal.    3.   She has a hx of L spine stenosis, MRI done 02.  She has declined any further eval or w/u for years b/c she does not want surgery.  She has never had injections done and is quite non adherent with oral meds including the last RX for TCA at night.  She is having more pain but is still functional w/o bowel or bladder changes and no leg weakness.  Current Medications (verified): 1)  Losartan Potassium 100 Mg Tabs (Losartan Potassium) .[name].. 1 Tab By Mouth Once Daily 2)  Baby Aspirin 81 Mg  Chew (Aspirin) .... Take 1 Tablet By Mouth Once A Day 3)  Multivitamins   Tabs (Multiple Vitamin) .... Take 1 Tablet By Mouth Once A Day 4)  Caltrate 600+d 600-400 Mg-Unit  Tabs (Calcium Carbonate-Vitamin D) .... Take 1 Tablet By Mouth Once A Day 5)  Diclofenac Sodium 50 Mg  Tbec (Diclofenac Sodium) .[name].. 1 Tab By Mouth  Two Times A Day With Food As Needed For Back Pain 6)  Hydrochlorothiazide 25 Mg Tabs (Hydrochlorothiazide) .[name].. 1 Tab By Mouth Daily 7)  Nortriptyline Hcl 25 Mg Caps (Nortriptyline Hcl) .[name].. 1 Capsule By Mouth Qhs  Allergies (verified): 1)  ! Pcn 2)  Ace Inhibitors  Past History:  Past Medical History: Reviewed history from 11/20/2009 and no changes required. 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 [id number] (1 daughter died of AIDS) DDD L spine, spinal stenosis hx of migraine recurrent H pylori gastritis ACEi cough stress test 05-2009  Social History: Reviewed history from 03/28/2008 and no changes required. 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 2 daugthers, 13 grandkids in [place]. Quit smoking 40 yrs ago. Denies ETOH. Little exercise. Good diet. Mom is now living in [place].  Review of Systems  See HPI  Physical Exam  General:  alert, well-developed, well-nourished, and well-hydrated.   Head:  normocephalic and atraumatic.   Eyes:  pupils equal, pupils round, and pupils reactive to light.   Mouth:  pharynx pink and moist.   Neck:  no masses.   Lungs:  Normal respiratory effort, chest expands symmetrically. Lungs are clear to auscultation, no crackles or wheezes. Heart:  Regular rate and rhythm with normal S1 and S2. No appreciable murmurs. L carotid bruit. No temporal bruits.  Extremities:  no LE edema Neurologic:  sensation intact to light touch, gait normal, and DTRs symmetrical and normal.   Skin:  color normal.   Psych:  good eye contact, not anxious appearing, and not depressed appearing.     Impression & Recommendations:  Problem # 1:  ESSENTIAL HYPERTENSION, BENIGN (ICD-401.1) Assessment Deteriorated Poorly controlled due to med non adherence.  Pt understands the  consuquences of not taking her meds.  I adjusted her schedule today to see if we can improve her symptoms.  RTC for a nurse BP check in 8 wks. The following medications were removed from the medication list:    Atenolol 50 Mg Tabs (Atenolol) .[name]... 1 tab by mouth daily Her updated medication list for this problem includes:    Losartan Potassium 100 Mg Tabs (Losartan potassium) .[name]... 1/2 tab by mouth bid    Hydrochlorothiazide 25 Mg Tabs (Hydrochlorothiazide) .[name]... 1 tab by mouth daily    Atenolol 100 Mg Tabs (Atenolol) .[name]... 1 tab by mouth daily  BP today: 153/85 Prior BP: 150/77 (02/19/2010)  Prior 10 Yr Risk Heart Disease: 17 % (05/16/2008)  Labs Reviewed: K+: 4.2 (02/22/2010) Creat: : 0.87 (02/22/2010)   Chol: 210 (02/22/2010)   HDL: 45 (02/22/2010)   LDL: 143 (02/22/2010)   TG: 112 (02/22/2010)  Problem # 2:  HYPERLIPIDEMIA (ICD-272.4) She stopped her statin b/c she didn't tolerate it well.  Her LDL has crept up the past 3 yrs due to poor diet.  She wants to try to improve her diet + fish oil and will recheck her in 2 more mos. The following medications were removed from the medication list:    Simvastatin 20 Mg Tabs (Simvastatin) .[name]... 1 tab by mouth qhs  Labs Reviewed: SGOT: 19 (02/22/2010)   SGPT: 13 (02/22/2010)  Prior 10 Yr Risk Heart Disease: 17 % (05/16/2008)   HDL:45 (02/22/2010), 48 (02/26/2009)  LDL:143 (02/22/2010), 125 (02/26/2009)  Chol:210 (02/22/2010), 191 (02/26/2009)  Trig:112 (02/22/2010), 89 (02/26/2009)  Problem # 3:  SPINAL STENOSIS, LUMBAR (ICD-724.02) Gradually worsening over the years.  I have tried to get her to take TCA at night, RX NSAIDS and PT but she is non adherent.  Since she has a normal neuro exam w/o red flags, will hold off on repeating her L spine MRI or adding more meds.  Complete Medication List: 1)  Losartan Potassium 100 Mg Tabs (Losartan potassium) .... 1/2 tab by mouth bid 2)  Baby Aspirin 81 Mg Chew (Aspirin) .... Take 1 tablet by mouth  once a day 3)  Multivitamins Tabs (Multiple vitamin) .... Take 1 tablet by mouth once a day 4)  Caltrate 600+d 600-400 Mg-unit Tabs (Calcium carbonate-vitamin d) .... Take 1 tablet by mouth once a day 5)  Diclofenac Sodium 50 Mg Tbec (Diclofenac sodium) .[name].. 1 tab by mouth two times a day with food as needed for back pain 6)  Hydrochlorothiazide 25 Mg Tabs (Hydrochlorothiazide) .[name].. 1 tab by mouth daily 7)  Nortriptyline Hcl 25 Mg Caps (  Nortriptyline hcl) .[name].. 1 capsule by mouth qhs 8)  Atenolol 100 Mg Tabs (Atenolol) .[name].. 1 tab by mouth daily  Patient Instructions: 1)  Take meds as listed below. 2)  Call if back pain worsens. 3)  Return for a nurse visit BP check with repeat cholesterol testing in 2 mos.

## 2011-01-04 NOTE — Assessment & Plan Note (Signed)
Summary: BP CHECK//VGJ  Nurse Visit   Vital Signs:  Patient profile:   70 year old female O2 Sat:      98 % on Room air Pulse rate:   56 / minute BP sitting:   137 / 70  (right arm) Cuff size:   large  Vitals Entered By: Payton Spark CMA (July 09, 2010 8:18 AM)  O2 Flow:  Room air CC: BP check and lipid profile   Current Medications (verified): 1)  Losartan Potassium 100 Mg Tabs (Losartan Potassium) .... 1/2 Tab By Mouth Bid 2)  Baby Aspirin 81 Mg  Chew (Aspirin) .... Take 1 Tablet By Mouth Once A Day 3)  Multivitamins   Tabs (Multiple Vitamin) .... Take 1 Tablet By Mouth Once A Day 4)  Caltrate 600+d 600-400 Mg-Unit  Tabs (Calcium Carbonate-Vitamin D) .... Take 1 Tablet By Mouth Once A Day 5)  Diclofenac Sodium 50 Mg  Tbec (Diclofenac Sodium) .[name].. 1 Tab By Mouth Two Times A Day With Food As Needed For Back Pain 6)  Hydrochlorothiazide 25 Mg Tabs (Hydrochlorothiazide) .[name].. 1 Tab By Mouth Daily 7)  Nortriptyline Hcl 25 Mg Caps (Nortriptyline Hcl) .[name].. 1 Capsule By Mouth Qhs 8)  Atenolol 100 Mg Tabs (Atenolol) .[name].. 1 Tab By Mouth Daily  Allergies (verified): 1)  ! Pcn 2)  Ace Inhibitors  Orders Added: 1)  T-Lipid Profile [80061-22930] 2)  Est. Patient Level I [[address]]    Impression & Recommendations:  Problem # 1:  ESSENTIAL HYPERTENSION, BENIGN (ICD-401.1) Assessment Improved  Her updated medication list for this problem includes:    Losartan Potassium 100 Mg Tabs (Losartan potassium) .[name]... 1/2 tab by mouth bid    Hydrochlorothiazide 25 Mg Tabs (Hydrochlorothiazide) .[name]... 1 tab by mouth daily    Atenolol 100 Mg Tabs (Atenolol) .[name]... 1 tab by mouth daily  BP today: 137/70 Prior BP: 153/85 (05/10/2010)  Prior 10 Yr Risk Heart Disease: 17 % (05/16/2008)  Labs Reviewed: K+: 4.2 (02/22/2010) Creat: : 0.87 (02/22/2010)   Chol: 210 (02/22/2010)   HDL: 45 (02/22/2010)   LDL: 143 (02/22/2010)   TG: 112 (02/22/2010)  Problem # 2:  HYPERLIPIDEMIA  (ICD-272.4)  Orders: T-Lipid Profile ([id number])  Complete Medication List: 1)  Losartan Potassium 100 Mg Tabs (Losartan potassium) .... 1/2 tab by mouth bid 2)  Baby Aspirin 81 Mg Chew (Aspirin) .... Take 1 tablet by mouth once a day 3)  Multivitamins Tabs (Multiple vitamin) .... Take 1 tablet by mouth once a day 4)  Caltrate 600+d 600-400 Mg-unit Tabs (Calcium carbonate-vitamin d) .... Take 1 tablet by mouth once a day 5)  Diclofenac Sodium 50 Mg Tbec (Diclofenac sodium) .[name].. 1 tab by mouth two times a day with food as needed for back pain 6)  Hydrochlorothiazide 25 Mg Tabs (Hydrochlorothiazide) .[name].. 1 tab by mouth daily 7)  Nortriptyline Hcl 25 Mg Caps (Nortriptyline hcl) .[name].. 1 capsule by mouth qhs 8)  Atenolol 100 Mg Tabs (Atenolol) .[name].. 1 tab by mouth daily

## 2011-01-04 NOTE — Assessment & Plan Note (Signed)
Summary: allergies/ sinuses   Vital Signs:  Patient profile:   70 year old female Height:      64 inches Weight:      152 pounds BMI:     26.19 O2 Sat:      99 % on Room air Temp:     98.7 degrees F oral Pulse rate:   53 / minute BP sitting:   150 / 77  (left arm) Cuff size:   regular  Vitals Entered By: Payton Spark CMA (September 03, 2010 10:14 AM)  O2 Flow:  Room air CC: Sinus issues. Xray from dentist was questioned.    Primary Care Provider:  Seymour Bars DO  CC:  Sinus issues. Xray from dentist was questioned. [name]  History of Present Illness: 70 yo AAF presents for an abnormal dental xray in the L maxillary sinus.   She has had a runny nose x 6-8 wks.  Moved here from up [place] 2-3 yrs ago and never had allergies.  She is having itchy eyes with L eye pain and clear rhinorrhea.  She is getting postnasal.  She is not having F/C or HAs.  She has L upper dental pain but is having more dental work done.    She has not taking any antihistamines, decongestants or nasal sprays.    Allergies: 1)  ! Pcn 2)  Ace Inhibitors  Past History:  Past Medical History: Reviewed history from 11/20/2009 and no changes required. 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 [id number] (1 daughter died of AIDS) DDD L spine, spinal stenosis hx of migraine recurrent H pylori gastritis ACEi cough stress test 05-2009  Social History: Reviewed history from 03/28/2008 and no changes required. 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 Moved from [facility] [place]. 2 daugthers, 13 grandkids in [place]. Quit smoking 40 yrs ago. Denies ETOH. Little exercise. Good diet. Mom is now living in [place].  Review of Systems      See HPI  Physical Exam  General:  alert, well-developed, well-nourished, and well-hydrated.   Head:  normocephalic and  atraumatic.  sinuses NTTP Eyes:  bilat clouding of the anterior chambers without injection.  bilat allergic shiners Ears:  wax covering the L TM, normal on the R Nose:  boggy, almost touching bilat nasal turbinates Mouth:  o/p pink and moist with mild injection and post nasal drainage. Neck:  no masses.   Lungs:  Normal respiratory effort, chest expands symmetrically. Lungs are clear to auscultation, no crackles or wheezes. Heart:  Regular rate and rhythm with normal S1 and S2. No appreciable murmurs. L carotid bruit. No temporal bruits.  Skin:  color normal and no rashes.   Cervical Nodes:  No lymphadenopathy noted   Impression & Recommendations:  Problem # 1:  ACUTE MAXILLARY SINUSITIS (ICD-461.0) She had some opacification of the L maxillary sinus on a dental xray which she brought in today which is secondary to the allergic rhinitis symptoms that she has had for the past 2 mos and has not treated.    She is already on Clindamycin for a tooth abscess.  This should be adequate coverage for sinusitis as long as she has 10 days worth.   Her updated medication list for this problem includes:    Fluticasone Propionate 50 Mcg/act Susp (Fluticasone propionate) .[name]... 2 sprays/  nostril daily  Problem # 2:  ALLERGIC RHINITIS (ICD-477.9) Untreated allergies.  Will treat with Flonase + Zyrtec thru Fall for allergies.    Her updated medication list for this problem includes:    Fluticasone Propionate 50 Mcg/act Susp (Fluticasone propionate) .[name]... 2 sprays/ nostril daily    Zyrtec Allergy 10 Mg Caps (Cetirizine hcl) .[name]... 1 tab by mouth qpm  Problem # 3:  ESSENTIAL HYPERTENSION, BENIGN (ICD-401.1) BP high today but previously at goal.  Reports fighting w/ husband just prior to her appt this AM. Her updated medication list for this problem includes:    Losartan Potassium 100 Mg Tabs (Losartan potassium) .[name]... 1/2 tab by mouth bid    Hydrochlorothiazide 25 Mg Tabs (Hydrochlorothiazide) .[name]... 1 tab  by mouth daily    Atenolol 100 Mg Tabs (Atenolol) .[name]... 1 tab by mouth daily  BP today: 150/77 Prior BP: 135/73 (07/16/2010)  Prior 10 Yr Risk Heart Disease: 17 % (05/16/2008)  Labs Reviewed: K+: 4.2 (02/22/2010) Creat: : 0.87 (02/22/2010)   Chol: 200 (07/09/2010)   HDL: 38 (07/09/2010)   LDL: 145 (07/09/2010)   TG: 87 (07/09/2010)  Complete Medication List: 1)  Losartan Potassium 100 Mg Tabs (Losartan potassium) .... 1/2 tab by mouth bid 2)  Baby Aspirin 81 Mg Chew (Aspirin) .... Take 1 tablet by mouth once a day 3)  Multivitamins Tabs (Multiple vitamin) .... Take 1 tablet by mouth once a day 4)  Caltrate 600+d 600-400 Mg-unit Tabs (Calcium carbonate-vitamin d) .... Take 1 tablet by mouth once a day 5)  Diclofenac Sodium 50 Mg Tbec (Diclofenac sodium) .[name].. 1 tab by mouth two times a day with food as needed for back pain 6)  Hydrochlorothiazide 25 Mg Tabs (Hydrochlorothiazide) .[name].. 1 tab by mouth daily 7)  Nortriptyline Hcl 25 Mg Caps (Nortriptyline hcl) .[name].. 1 capsule by mouth qhs 8)  Atenolol 100 Mg Tabs (Atenolol) .[name].. 1 tab by mouth daily 9)  Crestor 10 Mg Tabs (Rosuvastatin calcium) .... 1/2 tab by mouth every other night 10)  Fluticasone Propionate 50 Mcg/act Susp (Fluticasone propionate) .... 2 sprays/ nostril daily 11)  Zyrtec Allergy 10 Mg Caps (Cetirizine hcl) .[name].. 1 tab by mouth qpm  Patient Instructions: 1)  For sinusitis: make sure you have 10 days of antibiotics at home (given by dentist).  If not, please call me back. 2)  Add Flonase - 2 sprays/ nostril daily + Zyrtec (OTC) in the evenings for allergic rhinitis. 3)  Stay on Allergy meds for the next 6 wks. 4)  Return for follow up if allergy symptoms have not improved in the next 3 wks. Prescriptions: FLUTICASONE PROPIONATE 50 MCG/ACT SUSP (FLUTICASONE PROPIONATE) 2 sprays/ nostril daily  #1 bottle x 3   Entered and Authorized by:   Seymour Bars DO   Signed by:   Seymour Bars DO on 09/03/2010   Method used:    Electronically to        [facility] [phone]* (retail)       [address] [place], [place]  [address]       Ph: [phone]       Fax: [phone]   RxID:   [id number]

## 2011-01-17 ENCOUNTER — Encounter: Payer: Self-pay | Admitting: Family Medicine

## 2011-01-17 ENCOUNTER — Encounter (INDEPENDENT_AMBULATORY_CARE_PROVIDER_SITE_OTHER): Payer: BC Managed Care – PPO | Admitting: Family Medicine

## 2011-01-17 ENCOUNTER — Encounter (INDEPENDENT_AMBULATORY_CARE_PROVIDER_SITE_OTHER): Payer: Self-pay | Admitting: *Deleted

## 2011-01-17 DIAGNOSIS — R0989 Other specified symptoms and signs involving the circulatory and respiratory systems: Secondary | ICD-10-CM | POA: Insufficient documentation

## 2011-01-17 DIAGNOSIS — Z860100 Personal history of colon polyps, unspecified: Secondary | ICD-10-CM | POA: Insufficient documentation

## 2011-01-17 DIAGNOSIS — R61 Generalized hyperhidrosis: Secondary | ICD-10-CM | POA: Insufficient documentation

## 2011-01-17 DIAGNOSIS — Z0000 Encounter for general adult medical examination without abnormal findings: Secondary | ICD-10-CM

## 2011-01-18 ENCOUNTER — Other Ambulatory Visit: Payer: Self-pay | Admitting: Family Medicine

## 2011-01-18 ENCOUNTER — Encounter: Payer: Self-pay | Admitting: Family Medicine

## 2011-01-18 DIAGNOSIS — Z780 Asymptomatic menopausal state: Secondary | ICD-10-CM

## 2011-01-19 LAB — CONVERTED CEMR LAB
ALT: 13 units/L (ref 0–35)
AST: 19 units/L (ref 0–37)
Albumin: 4.3 g/dL (ref 3.5–5.2)
Alkaline Phosphatase: 72 units/L (ref 39–117)
Bilirubin, Direct: 0.1 mg/dL (ref 0.0–0.3)
Cholesterol: 146 mg/dL (ref 0–200)
Eosinophils Absolute: 0.2 10*3/uL (ref 0.0–0.7)
LDL Cholesterol: 88 mg/dL (ref 0–99)
Monocytes Absolute: 0.6 10*3/uL (ref 0.1–1.0)
Neutro Abs: 4.7 10*3/uL (ref 1.7–7.7)
Neutrophils Relative %: 60 % (ref 43–77)
RDW: 12.7 % (ref 11.5–15.5)
Total CHOL/HDL Ratio: 3.7
VLDL: 19 mg/dL (ref 0–40)
WBC: 7.8 10*3/uL (ref 4.0–10.5)

## 2011-01-24 ENCOUNTER — Telehealth: Payer: Self-pay | Admitting: Family Medicine

## 2011-01-25 ENCOUNTER — Telehealth: Payer: Self-pay | Admitting: Family Medicine

## 2011-01-25 ENCOUNTER — Ambulatory Visit
Admission: RE | Admit: 2011-01-25 | Discharge: 2011-01-25 | Disposition: A | Discharge status: Home / Self Care | Payer: BC Managed Care – PPO | Source: Ambulatory Visit | Attending: Family Medicine | Admitting: Family Medicine

## 2011-01-25 ENCOUNTER — Telehealth (INDEPENDENT_AMBULATORY_CARE_PROVIDER_SITE_OTHER): Payer: Self-pay | Admitting: *Deleted

## 2011-01-25 DIAGNOSIS — Z780 Asymptomatic menopausal state: Secondary | ICD-10-CM

## 2011-01-26 NOTE — Letter (Signed)
Summary: Primary Care Consult Scheduled Letter  [facility]  [address]   [place], [place] [address]   Phone: [phone]  Fax: [phone]      01/17/2011 MRN: [id number]  TIAN DAVISON [address] [name], [place]  [address]  [place]    Dear Ms. Borchard,     We have scheduled an appointment for you.  At the recommendation of Dr.Bowen, we have scheduled you a consult with [facility] pre-visit with a nurse on 02-01-11 at 1:30 and procedure with the doctor for 02-08-11 at 9:30 ( Dr. Rhetta Mura ) at 9:30.  Their address is [address] [place] [address]. The office phone number is [phone].  If this appointment day and time is not convenient for you, please feel free to call the office of the doctor you are being referred to at the number listed above and reschedule the appointment.     It is important for you to keep your scheduled appointments. We are here to make sure you are given good patient care.    Thank you, Michaelle Copas  Patient Care Coordinator [facility] [name]

## 2011-01-26 NOTE — Assessment & Plan Note (Signed)
Summary: CPE   Vital Signs:  Patient profile:   70 year old female Height:      64 inches Weight:      153 pounds BMI:     26.36 O2 Sat:      97 % on Room air Pulse rate:   62 / minute BP sitting:   137 / 77  (left arm) Cuff size:   regular  Vitals Entered By: Payton Spark CMA (January 17, 2011 10:39 AM)  O2 Flow:  Room air CC: CPE   Primary Care Provider:  Seymour Bars DO  CC:  CPE.  History of Present Illness: 70 yo postmenpausal G2P2 AAF presents for CPE.  She is doing well though c/w on and off neck pain and HAs.  She has a known dx of lumbar DDD with stenosis.  Doing well on current meds for HTN and high cholesterol though stopped Micardis b/c it made her feel funny.  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  She is due for her DEXA and updated colonoscopy. Immunizations are UTD. Fasting labs are due.   Her mammogram is UTD. She had a normal nuclear stress test 2 yrs ago and denies CP or DOE.     Allergies: 1)  ! Pcn 2)  Ace Inhibitors  Past History:  Past Medical History: Reviewed history from 11/20/2009 and no changes required. 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 [id number] (1 daughter died of AIDS) DDD L spine, spinal stenosis hx of migraine recurrent H pylori gastritis ACEi cough stress test 05-2009  Past Surgical History: Reviewed history from 02/11/2008 and no changes required. L breast cyst removed 1969  Social History: Reviewed history from 03/28/2008 and no changes required. 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 Moved from [facility] 2009. 2 daugthers, 13 grandkids in [place]. Quit smoking 40 yrs ago. Denies ETOH. Little exercise. Good diet. Mom is now living in  [place].  Review of Systems  The patient denies anorexia, fever, weight loss, weight gain, vision loss, decreased hearing, hoarseness, chest pain, syncope, dyspnea on exertion, peripheral edema, prolonged cough, headaches, hemoptysis, abdominal pain, melena, hematochezia, severe indigestion/heartburn, hematuria, incontinence, genital sores, muscle weakness, suspicious skin lesions, transient blindness, difficulty walking, depression, unusual weight change, abnormal bleeding, enlarged lymph nodes, angioedema, breast masses, and testicular masses.    Physical Exam  General:  alert, well-developed, well-nourished, and well-hydrated.   Head:  normocephalic and atraumatic.   Eyes:  pupils equal, pupils round, and pupils reactive to light.  wears glasses Ears:  EACs patent; TMs translucent and gray with good cone of light and bony landmarks.  Nose:  no nasal discharge.   very boggy turbinates with acute bleed from the L nostril, easily cauterized with 1 stick of silver nitrate. Mouth:  good dentition and pharynx pink and moist.   Neck:  no masses.  L carotid bruit present Lungs:  Normal respiratory effort, chest expands symmetrically. Lungs are clear to auscultation, no crackles or wheezes. Heart:  Normal rate and regular rhythm. S1 and S2 normal without gallop, murmur, click, rub or other extra sounds. Abdomen:  soft, non-tender, normal bowel sounds, no distention, no masses, no guarding,  no hepatomegaly, and no splenomegaly.  no AA bruits Pulses:  2+ radial and pedal pulses Extremities:  no LE edema Skin:  color normal.   Cervical Nodes:  No lymphadenopathy noted Psych:  good eye contact, not anxious appearing, and not depressed appearing.     Impression & Recommendations:  Problem # 1:  HEALTH MAINTENANCE EXAM (ICD-V70.0) Keeping healthy checklist for women reviewed. BP at goal on current meds, continue. BMI 26= overwt.  working on healthy diet and needs to add regular exercise. Will obtain  carotid dopplers for L sided bruit. Update fasting labs since Crestor was added in Aug. Mammo due in April Update DEXA and colonoscopy. 2010 normal nuclear stress test. RTC in 3 mos for f/u.  Complete Medication List: 1)  Baby Aspirin 81 Mg Chew (Aspirin) .... Take 1 tablet by mouth once a day 2)  Multivitamins Tabs (Multiple vitamin) .... Take 1 tablet by mouth once a day 3)  Hydrochlorothiazide 25 Mg Tabs (Hydrochlorothiazide) .[name].. 1 tab by mouth daily 4)  Nortriptyline Hcl 25 Mg Caps (Nortriptyline hcl) .[name].. 1 capsule by mouth qhs 5)  Atenolol 100 Mg Tabs (Atenolol) .[name].. 1 tab by mouth daily 6)  Crestor 10 Mg Tabs (Rosuvastatin calcium) .... 1/2 tab by mouth every other night 7)  Zyrtec Allergy 10 Mg Caps (Cetirizine hcl) .[name].. 1 tab by mouth qpm  Other Orders: T-Lipid Profile [phone]) T-Liver Profile [phone]) T-TSH [phone]) T-CBC w/Diff [id number]) T-DXA Bone Density/ Appendicular (28413) T-Dual DXA Bone Density/ Axial (24401) Gastroenterology Referral (GI) T-Vascular Study-Carotids Complete (02725)  Patient Instructions: 1)  Stay on current meds. 2)  Update fasitng labs one morning. 3)  Will set up : 4)  colonoscopy 5)  carotid doppler study. 6)  Update your bone density downstairs. 7)  Use Afrin 2 x a day x 4 days for nasal bleeding/ swelling. 8)  REturn for f/u in 3 mos.   Orders Added: 1)  T-Lipid Profile [[id number]] 2)  T-Liver Profile [[id number]] 3)  T-TSH [[id number]] 4)  T-CBC w/Diff [[id number]] 5)  T-DXA Bone Density/ Appendicular [77081] 6)  T-Dual DXA Bone Density/ Axial [77080] 7)  Gastroenterology Referral [GI] 8)  T-Vascular Study-Carotids Complete [93880] 9)  Est. Patient age 83&> [43329]

## 2011-01-27 DIAGNOSIS — I10 Essential (primary) hypertension: Secondary | ICD-10-CM | POA: Insufficient documentation

## 2011-01-28 ENCOUNTER — Encounter: Payer: Self-pay | Admitting: Family Medicine

## 2011-01-28 ENCOUNTER — Encounter (INDEPENDENT_AMBULATORY_CARE_PROVIDER_SITE_OTHER): Payer: Medicare Other

## 2011-01-28 ENCOUNTER — Encounter: Payer: Self-pay | Admitting: Internal Medicine

## 2011-01-28 DIAGNOSIS — R0989 Other specified symptoms and signs involving the circulatory and respiratory systems: Secondary | ICD-10-CM

## 2011-01-28 DIAGNOSIS — I6529 Occlusion and stenosis of unspecified carotid artery: Secondary | ICD-10-CM

## 2011-02-01 NOTE — Progress Notes (Signed)
Summary: pt req a call back  Phone Note Call from Patient   Caller: Patient Summary of Call: pt states she continues to get nose bleeds and she is not sure if dr. Cathey Endow wants her to come back in to see her. Pt req to have a call back today on her cell [phone]. Initial call taken by: Janeal Holmes,  January 25, 2011 11:23 AM  Follow-up for Phone Call        see previous note sent to stephanie.  call stephanie and see if she will call pt back Follow-up by: Seymour Bars DO,  January 25, 2011 11:55 AM  Additional Follow-up for Phone Call Additional follow up Details #1::        LM w/ husband for Pt to CB Additional Follow-up by: Payton Spark CMA,  January 25, 2011 12:02 PM    Additional Follow-up for Phone Call Additional follow up Details #2::    Judeth Cornfield addressed w/ Pt

## 2011-02-01 NOTE — Miscellaneous (Signed)
Summary: Orders Update  Clinical Lists Changes  Orders: Added new Test order of Carotid Duplex (Carotid Duplex) - Signed 

## 2011-02-01 NOTE — Progress Notes (Signed)
Summary: [facility]-Nose Bleed  Phone Note Call from Patient Call back at Home Phone [phone]   Caller: Patient Call For: Seymour Bars DO Summary of Call: Pt calls stating that she had another nose bleed this morning and was told to call our office if/when she had another.  Should pt use Afrin again for four days or is there another plan? Please advise.  Initial call taken by: Francee Piccolo CMA Duncan Dull),  January 25, 2011 9:46 AM  Follow-up for Phone Call        how bad was the bleeding?  was she able to stop it after a few minutes?    Have pt use Afrin OTC 1 spray per nostril every 12 hrs x 3 days. If bleeding is heavy or this does not improve, I will refer her to ENT.   Follow-up by: Seymour Bars DO,  January 25, 2011 10:03 AM     Appended Document: [facility]-Nose Bleed left message with female to have pt return call. Francee Piccolo CMA (AAMA)  January 25, 2011 11:07 AM   RC from pt.  She states nose bleed was small and did not take long to stop.  She only used two tissues to stop the bleeding.  She will use Afrin.  She is agreeable to call back if she continues to have nosebleeds for ENT referral. Francee Piccolo CMA Duncan Dull)  January 25, 2011 3:29 PM

## 2011-02-01 NOTE — Progress Notes (Signed)
Summary: [facility]-Reschedule colonoscopy  Phone Note Call from Patient Call back at Home Phone [phone]   Caller: Patient Call For: Seymour Bars DO Reason for Call: Referral Summary of Call: pt would like reschedule colonoscopy.Marland KitchenMarland KitchenI returned call to the home number above, which is also the number that came up on caller ID on the voicemail she left, and was told that I had the wrong number.  Ending follow up. Initial call taken by: Francee Piccolo CMA Duncan Dull),  January 24, 2011 11:20 AM

## 2011-02-08 ENCOUNTER — Other Ambulatory Visit: Payer: Self-pay | Admitting: Family Medicine

## 2011-02-08 DIAGNOSIS — Z1231 Encounter for screening mammogram for malignant neoplasm of breast: Secondary | ICD-10-CM

## 2011-03-15 LAB — COMPREHENSIVE METABOLIC PANEL
Alkaline Phosphatase: 62 U/L (ref 39–117)
BUN: 11 mg/dL (ref 6–23)
CO2: 27 mEq/L (ref 19–32)
Chloride: 109 mEq/L (ref 96–112)
GFR calc Af Amer: >60 mL/min (ref >60)
GFR calc non Af Amer: >60 mL/min (ref >60)
Total Bilirubin: 0.6 mg/dL (ref 0.3–1.2)

## 2011-03-15 LAB — CBC
HCT: 36.6 % (ref 36.0–46.0)
Hemoglobin: 12.7 g/dL (ref 12.0–15.0)
MCV: 95 fL (ref 78.0–100.0)
RBC: 3.85 MIL/uL — ABNORMAL LOW (ref 3.87–5.11)
RDW: 12.8 % (ref 11.5–15.5)
WBC: 6.9 10*3/uL (ref 4.0–10.5)

## 2011-03-15 LAB — CARDIAC PANEL(CRET KIN+CKTOT+MB+TROPI): Total CK: 93 U/L (ref 7–177)

## 2011-03-15 LAB — LIPID PANEL
Cholesterol: 212 mg/dL — ABNORMAL HIGH (ref 0–200)
VLDL: 8 mg/dL (ref 0–40)

## 2011-03-16 LAB — COMPREHENSIVE METABOLIC PANEL
ALT: 13 U/L (ref 0–35)
AST: 29 U/L (ref 0–37)
Albumin: 4.2 g/dL (ref 3.5–5.2)
Calcium: 9.2 mg/dL (ref 8.4–10.5)
Chloride: 106 mEq/L (ref 96–112)
Creatinine, Ser: 0.8 mg/dL (ref 0.4–1.2)
GFR calc Af Amer: >60 mL/min (ref >60)
Sodium: 144 mEq/L (ref 135–145)

## 2011-03-16 LAB — URINALYSIS, ROUTINE W REFLEX MICROSCOPIC
Bilirubin Urine: NEGATIVE
Glucose, UA: NEGATIVE mg/dL
Ketones, ur: NEGATIVE mg/dL
Leukocytes, UA: NEGATIVE
pH: 7 (ref 5.0–8.0)

## 2011-03-16 LAB — POCT CARDIAC MARKERS
CKMB, poc: 1.9 ng/mL (ref 1.0–8.0)
Myoglobin, poc: 66.4 ng/mL (ref 12–200)
Troponin i, poc: <0.05 ng/mL (ref 0.00–0.09)

## 2011-03-16 LAB — DIFFERENTIAL
Basophils Absolute: 0 10*3/uL (ref 0.0–0.1)
Basophils Relative: 0 % (ref 0–1)
Eosinophils Absolute: 0.1 10*3/uL (ref 0.0–0.7)
Eosinophils Absolute: 0.1 10*3/uL (ref 0.0–0.7)
Eosinophils Relative: 2 % (ref 0–5)
Eosinophils Relative: 2 % (ref 0–5)
Lymphocytes Relative: 29 % (ref 12–46)
Lymphocytes Relative: 30 % (ref 12–46)
Lymphs Abs: 2 10*3/uL (ref 0.7–4.0)
Lymphs Abs: 2.1 10*3/uL (ref 0.7–4.0)
Monocytes Absolute: 0.5 10*3/uL (ref 0.1–1.0)
Monocytes Absolute: 0.6 10*3/uL (ref 0.1–1.0)
Monocytes Relative: 8 % (ref 3–12)
Neutro Abs: 4.4 10*3/uL (ref 1.7–7.7)
Neutrophils Relative %: 60 % (ref 43–77)

## 2011-03-16 LAB — CBC
HCT: 37.4 % (ref 36.0–46.0)
Hemoglobin: 13 g/dL (ref 12.0–15.0)
MCHC: 34 g/dL (ref 30.0–36.0)
MCHC: 34.8 g/dL (ref 30.0–36.0)
MCV: 94.4 fL (ref 78.0–100.0)
MCV: 94.9 fL (ref 78.0–100.0)
Platelets: 228 10*3/uL (ref 150–400)
Platelets: 254 10*3/uL (ref 150–400)
RBC: 3.96 MIL/uL (ref 3.87–5.11)
RBC: 4.21 MIL/uL (ref 3.87–5.11)
RDW: 12.8 % (ref 11.5–15.5)
WBC: 6.5 10*3/uL (ref 4.0–10.5)
WBC: 7.3 10*3/uL (ref 4.0–10.5)

## 2011-03-16 LAB — URINE MICROSCOPIC-ADD ON

## 2011-03-16 LAB — TSH: TSH: 1.314 u[IU]/mL (ref 0.350–4.500)

## 2011-03-16 LAB — CARDIAC PANEL(CRET KIN+CKTOT+MB+TROPI)
Relative Index: INVALID (ref 0.0–2.5)
Total CK: 92 U/L (ref 7–177)
Troponin I: 0.02 ng/mL (ref 0.00–0.06)

## 2011-03-22 ENCOUNTER — Ambulatory Visit
Admission: RE | Admit: 2011-03-22 | Discharge: 2011-03-22 | Disposition: A | Discharge status: Home / Self Care | Payer: Medicare Other | Source: Ambulatory Visit | Attending: Family Medicine | Admitting: Family Medicine

## 2011-03-22 DIAGNOSIS — Z1231 Encounter for screening mammogram for malignant neoplasm of breast: Secondary | ICD-10-CM

## 2011-04-16 ENCOUNTER — Encounter: Payer: Self-pay | Admitting: Family Medicine

## 2011-04-18 ENCOUNTER — Ambulatory Visit (INDEPENDENT_AMBULATORY_CARE_PROVIDER_SITE_OTHER): Payer: Medicare Other | Admitting: Family Medicine

## 2011-04-18 ENCOUNTER — Telehealth: Payer: Self-pay | Admitting: Family Medicine

## 2011-04-18 ENCOUNTER — Encounter: Payer: Self-pay | Admitting: Family Medicine

## 2011-04-18 ENCOUNTER — Ambulatory Visit
Admission: RE | Admit: 2011-04-18 | Discharge: 2011-04-18 | Disposition: A | Discharge status: Home / Self Care | Payer: Medicare Other | Source: Ambulatory Visit | Attending: Family Medicine | Admitting: Family Medicine

## 2011-04-18 VITALS — BP 149/83 | HR 58 | Ht 64.0 in | Wt 156.0 lb

## 2011-04-18 DIAGNOSIS — I10 Essential (primary) hypertension: Secondary | ICD-10-CM

## 2011-04-18 DIAGNOSIS — E785 Hyperlipidemia, unspecified: Secondary | ICD-10-CM

## 2011-04-18 DIAGNOSIS — M542 Cervicalgia: Secondary | ICD-10-CM | POA: Insufficient documentation

## 2011-04-18 DIAGNOSIS — G8929 Other chronic pain: Secondary | ICD-10-CM | POA: Insufficient documentation

## 2011-04-18 MED ORDER — MELOXICAM 7.5 MG PO TABS: ORAL_TABLET | ORAL | Status: DC

## 2011-04-18 MED ORDER — ROSUVASTATIN CALCIUM 10 MG PO TABS: 10.0000 mg | ORAL_TABLET | Freq: Every day | ORAL | Status: DC

## 2011-04-18 NOTE — Telephone Encounter (Signed)
Pt aware of the above  

## 2011-04-18 NOTE — Telephone Encounter (Signed)
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

## 2011-04-18 NOTE — Assessment & Plan Note (Signed)
BP elevated today but she has yet to take her AM Atenolol.  She reports that she was 120/72 when she recently saw Dr Lalla Brothers back for her thyroid.

## 2011-04-18 NOTE — Progress Notes (Signed)
  Subjective:    Patient ID: Tina Medina, female    DOB: Jun 24, 1941, 70 y.o.   MRN: [id number]  HPI 70 yo AAF presents for f/u visit.  She had carotid dopplers done for a bruit in Feb and it came back normal.  She had her mammogram done and it was normal.  Her DEXA showed osteopenia and she is now talking calcium with D daily.  Doing well on Crestor.  F/u labs in Feb looked great on Crestor and she is tolerating it well.  She is taking Ibuprofen prn for chronic neck pain with limited ROM.  Denies radiation of pain or numbness down the arms.    BP 149/83  Pulse 58  Ht 5\' 4"  (1.626 m)  Wt 156 lb (70.761 kg)  BMI 26.78 kg/m2  SpO2 100%  Patient Active Problem List  Diagnoses  . MENINGIOMA  . THYROID NODULE  . GOITER, MULTINODULAR  . HYPERLIPIDEMIA  . BENIGN POSITIONAL VERTIGO  . ESSENTIAL HYPERTENSION, BENIGN  . ACUTE MAXILLARY SINUSITIS  . ALLERGIC RHINITIS  . GERD  . POSTMENOPAUSAL STATUS  . DERMATITIS, ATOPIC  . NECK PAIN  . SPINAL STENOSIS, LUMBAR  . NIGHT SWEATS  . CAROTID BRUIT, LEFT  . PERSONAL HISTORY OF COLONIC POLYPS      Review of Systems  Constitutional: Negative for unexpected weight change.  HENT: Positive for neck pain and neck stiffness.   Eyes: Negative for visual disturbance.  Respiratory: Negative for chest tightness and shortness of breath.   Cardiovascular: Negative for chest pain, palpitations and leg swelling.  Musculoskeletal: Positive for arthralgias.       Neck pain with decreased ROM  Neurological: Positive for headaches.  Psychiatric/Behavioral: Negative for sleep disturbance and dysphoric mood.       Objective:   Physical Exam  Constitutional: She appears well-developed and well-nourished.  Eyes: Pupils are equal, round, and reactive to light.  Neck: Neck supple. No thyromegaly present.  Cardiovascular: Normal rate, regular rhythm and normal heart sounds.   No murmur heard. Pulmonary/Chest: Effort normal and breath sounds normal. No  respiratory distress. She has no wheezes.  Musculoskeletal: She exhibits no edema.       Globally reduced active C spine ROM with tight trapezious muscles  Lymphadenopathy:    She has no cervical adenopathy.  Neurological:       Grip +5/5 bilat  Skin: Skin is warm and dry.  Psychiatric: She has a normal mood and affect.          Assessment & Plan:

## 2011-04-18 NOTE — Patient Instructions (Signed)
Stay on current meds.  Xray neck downstairs today. Will call you w/ results tomorrow.  Work on home neck stretches and add Meloxicam for pain/ inflammation.  Return for f/u in 6 mos.

## 2011-04-18 NOTE — Assessment & Plan Note (Signed)
Cholesterol looked much improved in Feb on Crestor, just doing 1/2 tab every other day and tolerating it well.  RFd.  Liver enzymes nreomal.

## 2011-04-18 NOTE — Assessment & Plan Note (Signed)
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  Will Xray today.  [name] options for treatment and due to cost, she declined PT or chiropractor referral.  Home neck stretches h/o given to pt.  Use heating pad and RX meloxicam in place of ibuprofen for pain/ inflammation.  Explained that this can certainly be the cause for her increased HAs.

## 2011-04-19 NOTE — H&P (Signed)
NAMECHENELL, LOZON NO.:  [id number]   MEDICAL RECORD NO.:  [id number]          PATIENT TYPE:  INP   LOCATION:  2004                         FACILITY:  [facility]   PHYSICIAN:  Pricilla Riffle, MD, FACCDATE OF BIRTH:  1941-03-15   DATE OF ADMISSION:  04/03/2009  DATE OF DISCHARGE:                              HISTORY & PHYSICAL   IDENTIFICATION:  Tina Medina is a 70 year old woman who presents for  evaluation of chest pain.   HISTORY OF PRESENT ILLNESS:  The patient has a long-standing history of  chest pain, it has gone intermittently times years.  In [place] she had  a GXT question type and this was negative in 2005.   In the past couple of weeks, she said she has felt dragging. On  Wednesday her blood pressure was low at 90/53, she felt tired.  She  stayed at home in bed.  She notes no recent illness.  Medicine doses  have not been changed.  Thursday, she felt okay.  She actually was doing  some digging in garden.  Went to bed last night feeling okay.  This  morning when she woke up she had a dull discomfort in left chest, not  pleuritic, not positional, no shortness of breath, no diaphoresis.  Pain  similar to what she has had in the past again, even in severity.  Went  to Dr. Cammie Sickle office sent to the emergency room.  In the ER pain  eased, came back, NTG x1 currently no discomfort.   She has been diagnosed with reflux in the past.  Denies symptoms and no  brackish bitter taste in the past.   ALLERGIES:  PENICILLIN.   PAST MEDICAL HISTORY:  1. Hypertension.  2. Reported reflux.   PAST SURGICAL HISTORY:  Breast biopsy.   MEDICATIONS:  Hydrochlorothiazide question dose, atenolol question dose  and aspirin.   SOCIAL HISTORY:  The patient does not smoke, drinks occasionally.   FAMILY HISTORY:  Mother had dementia.  Recently diabetes, is still  alive.  Father died of prostate cancer.  Family history significant for  hypertension.  No CAD.   REVIEW OF  SYSTEMS:  No dysphagia, bowels okay.  Reports that neck study  may have showed mild blockage in [place] on the left.  No recent  illness. Otherwise, negative for the above problem except as noted.   PHYSICAL EXAMINATION:  GENERAL:  The patient in no acute distress.  Denies chest pain at present.  VITAL SIGNS:  Blood pressure 168/70, pulse 16 and regular, respiratory  rate 14, O2 sat 100%.  HEENT:  Normocephalic, atraumatic, EOMI, PERRL.  NECK:  No bruits.  JVP is normal.  No thyromegaly.  LUNGS:  Clear to auscultation.  CARDIAC:  Regular rate and rhythm.  S1-S2.  No S3, no murmurs.  CHEST:  Slightly tender different from pain.  ABDOMEN:  Supple, nontender.  Normal bowel sounds.  No hepatomegaly.  No  masses.  EXTREMITIES:  No edema.  2+ pulses.   A 12-lead EKG sinus bradycardia 58 beats per minute, T-wave inversion in  aVL, V1 and  V2.   IMPRESSION:  A 70 year old woman with a history of chest pain.  She has  had on and off times years.  No change in quality or severity just  thought she should get it checked out today.  No associated shortness of  breath.  Of note, though blood pressure was low on Wednesday and has  been feeling punky or draggy for a couple of weeks.  This is the only  concerning thing.  Blood pressure initially  over 200 systolic.  Chest  is tender but different.  Otherwise exam is unremarkable.  EKG is  negative for significant ischemic findings, no old to compare.   PLAN:  Admit.  Continue aspirin, heparin, check lipids.  Continue beta  blocker.  Hold hydrochlorothiazide since blood pressure was low.  Rule  out MI.  If positive cath and negative could get outpatient Myoview  since symptoms chronic really.  Hypertension, follow blood pressure,  lipids.  Will need followup check in a.m.      Pricilla Riffle, MD, [facility]  Electronically Signed     PVR/MEDQ  D:  04/03/2009  T:  04/04/2009  Job:  [id number]

## 2011-04-19 NOTE — Discharge Summary (Signed)
NAMECECELY, RENGEL NO.:  [id number]   MEDICAL RECORD NO.:  [id number]          PATIENT TYPE:  INP   LOCATION:  2004                         FACILITY:  [facility]   PHYSICIAN:  Rollene Rotunda, MD, FACCDATE OF BIRTH:  1941-02-16   DATE OF ADMISSION:  04/03/2009  DATE OF DISCHARGE:  04/04/2009                               DISCHARGE SUMMARY   PRIMARY CARDIOLOGIST:  Pricilla Riffle, MD, [facility]   PRIMARY CARE PHYSICIAN:  Seymour Bars, DO   DISCHARGE DIAGNOSES:  1. Noncardiac chest pain.  2. Hyperlipidemia.  3. Bradycardia (without pauses, asymptomatic).   SECONDARY DIAGNOSES:  1. Hypertension.  2. Gastroesophageal reflux disease.  3. Status post breast biopsy.   ALLERGIES:  NKDA.   PROCEDURES PERFORMED DURING THIS HOSPITALIZATION:  The patient had EKG  on April 03, 2009, showing sinus bradycardia at a rate of 54 bpm.  No  acute ST-T wave changes.  No significant Q waves.  Normal axis.  No  evidence of hypertrophy.  PR 156, QRS 94, and QTc 413.  The patient had  EKG performed on Apr 04, 2009, no significant change from prior tracing.  The patient had a chest x-ray on April 03, 2009, showing no evidence of  acute cardiopulmonary disease.   HISTORY OF PRESENT ILLNESS:  Tina Medina is a 70 year old female with a  long history of chest pain, but no known history of coronary artery  disease.  The patient's last stress test was in 2005 and was negative,  question if there was Myoview or just a treadmill.  In the last couple  of weeks, she has felt dragging.  Last Wednesday, her blood pressure  was low 90/53, which is significant for the patient who generally has  hypertension in the 150s.  She also reports feeling tired at that time.  She stayed at home in bed.  She notes no recent illness.  Medications  have been stable.  Thursday, she felt fine.  She was working in her  garden.  However this morning (April 03, 2009), she woke up and had a  dull discomfort in her left  chest, nonpleuritic, not positional, no  shortness of breath, no diaphoresis.  Pain is similar to prior chest  pain in character and severity.  She saw her PCP, Dr. Cathey Endow who sent her  to the ER.  Upon eval in the ER, pain had eased and then returned, with  1 nitroglycerin pain resolved.  Note, she has been diagnosed with reflux  in the past.  Denies symptoms recently, no brackish or bitter taste in  the past.   HOSPITAL COURSE:  The patient admitted and underwent procedures as  described above.  She had 1 set of negative point-of-care markers and 2  sets of full negative cardiac enzymes.  Upon eval the morning of Apr 04, 2009, the patient's symptoms have totally resolved.  There was nothing  significant on telemetry other than sinus bradycardia without pauses  that was stable in the high 40s and low 50s in the early a.m.  The  patient's vital signs, otherwise remained stable  with mild-to-moderate  hypertension.  Due to no obstructive evidence of ischemia, the patient  will be discharged in good condition and scheduled for outpatient stress  Myoview.  She has also been instructed to follow up with her primary  care physician in the next 1-2 weeks.  The patient was found to have  high LDL cholesterol and will be given prescription for the low-dose  simvastatin as well as PPI.  At the time of discharge, the patient will  be given her new medication list and prescriptions as well as her  followup instructions and should have no questions or concerns that are  not addressed.   DISCHARGE LABORATORY DATA:  WBC is 6.9, HGB 12.7, HCT 36.6, and platelet  count 201.  Sodium 139, potassium 3.8, chloride 109, CO2 27, BUN 11,  creatinine 0.87, glucose 101, total bilirubin 0.6, alkaline phosphatase  62, AST 22, ALT 14, total protein 6.2, albumin 3.4, calcium 8.8, total  cholesterol 212, triglycerides 38, HDL 44, LDL 160, VLDL 8, and TSH  3.235.  The patient had urinalysis did show trace blood  otherwise, all  values within normal limits.   FOLLOWUP APPOINTMENTS:  1. Dr. Cathey Endow.  The patient is scheduled within 1-2 weeks.  2. Outpatient treadmill stress Myoview and followup with Dr. Tenny Craw.      Our office will call the patient.   DISCHARGE MEDICATIONS:  1. Hydrochlorothiazide 25 mg p.o. daily.  2. Atenolol 50 mg p.o. daily.  3. Simvastatin 20 mg p.o. at bedtime (new).  4. Protonix 40 mg p.o. daily (new).   DURATION OF THIS DISCHARGE ENCOUNTER:  Including physician time was 35  minutes.      Jarrett Ables, [facility]      Rollene Rotunda, MD, [facility]  Electronically Signed    MS/MEDQ  D:  04/04/2009  T:  04/05/2009  Job:  [id number]   cc:   Seymour Bars, D.O.  Pricilla Riffle, MD, [facility]

## 2011-04-28 ENCOUNTER — Telehealth: Payer: Self-pay | Admitting: Family Medicine

## 2011-04-28 NOTE — Telephone Encounter (Signed)
Pt called and concerned because [facility] sent her crestor 90 day supply and it cost $96.32 and she does not have the monies to pay this.  She thought we were not going to have meds come from [facility] at this point until she was able to do.  Wants to know what she can do about this. Plan:  Pt notified to discuss the problem.  Pt still has samples she is taking that our office gave her.  Also given a coupon card and she was anticipating using so it would help her with the cost.   Please advise because pt doesn't need the medication that she was sent.  Thought we were going to hold off on sending to the mail order pharmacy. Routed to Dr. Arlice Colt, LPN [facility]

## 2011-04-29 NOTE — Telephone Encounter (Signed)
She is only taking 1/2 tab every other than, so a 90 day RX will last her over 6 months, between this and samples, I think it is worth the cost.

## 2011-04-29 NOTE — Telephone Encounter (Signed)
Pt informed of the reply that Dr. Cathey Endow responded back with, and pt stated she was going to make an appt to come in and talk with Dr. Cathey Endow about the situation. Jarvis Newcomer, LPN [facility]

## 2011-05-24 ENCOUNTER — Ambulatory Visit (INDEPENDENT_AMBULATORY_CARE_PROVIDER_SITE_OTHER): Payer: Medicare Other | Admitting: Family Medicine

## 2011-05-24 ENCOUNTER — Encounter: Payer: Self-pay | Admitting: Family Medicine

## 2011-05-24 VITALS — BP 155/88 | HR 70 | Temp 98.9°F | Ht 63.0 in | Wt 153.0 lb

## 2011-05-24 DIAGNOSIS — J00 Acute nasopharyngitis [common cold]: Secondary | ICD-10-CM | POA: Insufficient documentation

## 2011-05-24 MED ORDER — ACETAMINOPHEN-CODEINE 300-30 MG PO TABS: 1.0000 | ORAL_TABLET | Freq: Four times a day (QID) | ORAL | Status: DC | PRN

## 2011-05-24 MED ORDER — AZITHROMYCIN 250 MG PO TABS: ORAL_TABLET | ORAL | Status: AC

## 2011-05-24 NOTE — Patient Instructions (Signed)
For early pneumonia, take Zithromax x 5 days (antibiotic). Use Tylenol #3 up to every 6 hrs for cough/ fevers.  Take with a little food.  This can be constipating and cause sedation.  Use nasal saline 3 x a day for the next wk with Afrin for nasal congestion up to 2 x a day.  Call if not improved in 10 days or if getting worse.

## 2011-05-24 NOTE — Assessment & Plan Note (Signed)
Likely viral but cough , nausea and fatigue have worsened in the last couple days, concerning for an early pneumonia.  Will go ahead and treat her with Zithromax x 5 days + Nasal saline with Afrin 2 x a day for the next 4 days.  Avoid systemic decongestants with HTN.  tyelnol #3 given for cough since she cannot take liquids.  Use caution about constipation and sedation on this.  Sip on water thru the day to thin out mucous.  Call if any new symptoms or if not improved in 10 days.

## 2011-05-24 NOTE — Progress Notes (Signed)
  Subjective:    Patient ID: Tina Medina, female    DOB: July 06, 1941, 70 y.o.   MRN: [id number]  HPI 70 yo AAF presents for feeling sick x 7 days.  Started with sore throat then developed congestion and cough.  She denies HAs, sinus pain.  No fevers or chills.  Has nausea.  She has a dry cough.  She used Psychiatrist which worked in the beginning.  She cannot take liquid medicines.  The cough is keeping her up at night. Denies chest tightness or SOB.  No diarrhea or vomitting.  BP 155/88  Pulse 70  Temp(Src) 98.9 F (37.2 C) (Oral)  Ht 5\' 3"  (1.6 m)  Wt 153 lb (69.4 kg)  BMI 27.10 kg/m2  SpO2 100%   Review of Systems  Constitutional: Positive for chills, appetite change and fatigue. Negative for fever.  HENT: Positive for congestion, sore throat, rhinorrhea and postnasal drip. Negative for sinus pressure.   Eyes: Negative for itching.  Respiratory: Negative for cough, chest tightness and shortness of breath.   Cardiovascular: Negative for chest pain.  Gastrointestinal: Positive for nausea. Negative for diarrhea.  Skin: Negative for rash.  Neurological: Negative for light-headedness and headaches.       Objective:   Physical Exam  Constitutional: She appears well-developed and well-nourished. No distress.  HENT:  Head: Normocephalic and atraumatic.  Right Ear: Tympanic membrane and external ear normal.  Left Ear: Tympanic membrane normal.  Nose: Mucosal edema and rhinorrhea present. Right sinus exhibits no maxillary sinus tenderness and no frontal sinus tenderness. Left sinus exhibits no maxillary sinus tenderness and no frontal sinus tenderness.  Mouth/Throat: Oropharynx is clear and moist.  Eyes: Conjunctivae are normal. Left eye exhibits no discharge.  Neck: Neck supple. No thyromegaly present.  Cardiovascular: Normal rate, regular rhythm and normal heart sounds.   Pulmonary/Chest: Effort normal and breath sounds normal. No respiratory distress. She has no wheezes. She has no  rales.       Dry cough  Musculoskeletal: She exhibits no edema.  Lymphadenopathy:    She has no cervical adenopathy.  Skin: Skin is warm and dry. No rash noted.          Assessment & Plan:

## 2011-08-12 ENCOUNTER — Ambulatory Visit (INDEPENDENT_AMBULATORY_CARE_PROVIDER_SITE_OTHER): Payer: Medicare Other | Admitting: Family Medicine

## 2011-08-12 ENCOUNTER — Encounter: Payer: Self-pay | Admitting: Family Medicine

## 2011-08-12 VITALS — BP 173/79 | HR 80 | Ht 63.0 in | Wt 154.0 lb

## 2011-08-12 DIAGNOSIS — R001 Bradycardia, unspecified: Secondary | ICD-10-CM

## 2011-08-12 DIAGNOSIS — R61 Generalized hyperhidrosis: Secondary | ICD-10-CM

## 2011-08-12 DIAGNOSIS — I498 Other specified cardiac arrhythmias: Secondary | ICD-10-CM

## 2011-08-12 DIAGNOSIS — R5383 Other fatigue: Secondary | ICD-10-CM

## 2011-08-12 DIAGNOSIS — Z23 Encounter for immunization: Secondary | ICD-10-CM

## 2011-08-12 DIAGNOSIS — I10 Essential (primary) hypertension: Secondary | ICD-10-CM

## 2011-08-12 MED ORDER — LOSARTAN POTASSIUM 100 MG PO TABS: 100.0000 mg | ORAL_TABLET | Freq: Every day | ORAL | Status: DC

## 2011-08-12 MED ORDER — ATENOLOL 100 MG PO TABS: 50.0000 mg | ORAL_TABLET | Freq: Every day | ORAL | Status: DC

## 2011-08-12 NOTE — Progress Notes (Signed)
  Subjective:    Patient ID: Tina Medina, female    DOB: 07/05/1941, 70 y.o.   MRN: [id number]  HPI Has been sweating very easily with any activity. Sweat starts pouring.  Started 2 months ago.  She didn't take her atenolol this AM.  Home machine is off, Occ feels heart racing at night when down at night, that started about 3 weeks ago. Lasts about 10 minutes before resolves.  No CP.  Does have occ discomfrot in her left breast but that has been there for years.  Drinks 1 caffeine drink in the AM. Wokeup with a left sided HA this aM that radiates into her jaw.    Review of Systems     Objective:   Physical Exam  Constitutional: She is oriented to person, place, and time. She appears well-developed and well-nourished.  HENT:  Head: Normocephalic and atraumatic.  Neck: Neck supple. No thyromegaly present.  Cardiovascular: Normal rate, regular rhythm and normal heart sounds.        NO carotid bruits.   Pulmonary/Chest: Effort normal and breath sounds normal.  Abdominal: Soft. Bowel sounds are normal. She exhibits no distension and no mass. There is no tenderness. There is no rebound and no guarding.  Lymphadenopathy:    She has no cervical adenopathy.  Neurological: She is alert and oriented to person, place, and time.  Skin: Skin is warm and dry.  Psychiatric: She has a normal mood and affect. Her behavior is normal.          Assessment & Plan:  Sweats and fatigue- could be secondary to HTN or bradycardia. Infection unlikley but will check a CBC. Also consider thyroid. Exam is nl but she does have a hx of a thyroid nodule.   Bradycardia - HR down to 47 bpm on the EKG and she didn't take her atenolol this AM. Discussed will have her cut her atenolol in half and add losartan.

## 2011-08-12 NOTE — Patient Instructions (Signed)
Cut your atenolol in half.   Follow up in 3 weeks to recheck you BP.  Will start losartan We will call you with your lab results.

## 2011-08-13 LAB — COMPLETE METABOLIC PANEL WITH GFR
ALT: 11 U/L (ref 0–35)
CO2: 29 mEq/L (ref 19–32)
Chloride: 106 mEq/L (ref 96–112)
Glucose, Bld: 84 mg/dL (ref 70–99)
Sodium: 142 mEq/L (ref 135–145)
Total Bilirubin: 0.4 mg/dL (ref 0.3–1.2)
Total Protein: 7.3 g/dL (ref 6.0–8.3)

## 2011-08-13 LAB — CBC WITH DIFFERENTIAL/PLATELET
Basophils Absolute: 0 10*3/uL (ref 0.0–0.1)
HCT: 38.9 % (ref 36.0–46.0)
Hemoglobin: 12.9 g/dL (ref 12.0–15.0)
Lymphocytes Relative: 31 % (ref 12–46)
Monocytes Absolute: 0.7 10*3/uL (ref 0.1–1.0)
Monocytes Relative: 10 % (ref 3–12)
Neutro Abs: 3.8 10*3/uL (ref 1.7–7.7)
RBC: 4.09 MIL/uL (ref 3.87–5.11)
WBC: 6.9 10*3/uL (ref 4.0–10.5)

## 2011-08-13 LAB — TSH: TSH: 1.133 u[IU]/mL (ref 0.350–4.500)

## 2011-08-14 ENCOUNTER — Encounter: Payer: Self-pay | Admitting: Family Medicine

## 2011-08-14 NOTE — Assessment & Plan Note (Addendum)
Elevated BP not well controlled She didn't take her antenolol this AM. Cut in half secondary to bradycardia. Will add losartan. F.U in 3 weeks to recheck pressure.

## 2011-08-15 ENCOUNTER — Telehealth: Payer: Self-pay | Admitting: Family Medicine

## 2011-08-15 ENCOUNTER — Other Ambulatory Visit: Payer: Self-pay | Admitting: Family Medicine

## 2011-08-15 NOTE — Telephone Encounter (Signed)
Called patient: Complete metabolic panel and complete blood count are normal. Thyroid is normal. Please ask her what her heart rate was over the weekend had wanted her to check and see if she was still running under 60.

## 2011-08-15 NOTE — Telephone Encounter (Signed)
Pt called and stated she was in last Friday and was our office was suppose to have sent a cozarr refill on her medication.  [facility] does not have. Plan:  Will call the pharm at 09:00 am to check on the prescription RF.  Pt. Chart file says it was sent. Jarvis Newcomer, LPN [facility]

## 2011-08-15 NOTE — Telephone Encounter (Signed)
Notified the pt that the pharm [facility] was called and given refills that were sent last Friday that the pharm didn't have (atenolol and cozarr). Jarvis Newcomer, LPN [facility]

## 2011-08-18 ENCOUNTER — Encounter: Payer: Self-pay | Admitting: Family Medicine

## 2011-08-18 ENCOUNTER — Telehealth: Payer: Self-pay | Admitting: Family Medicine

## 2011-08-18 NOTE — Telephone Encounter (Signed)
Pt called to get her recent lab results.   Plan:  Pt informed of lab results, and asked the pt what her HR had been running.  Pt's HR still in the 50"s. Routed this statistic to Dr. Linford Arnold. Jarvis Newcomer, LPN [facility]

## 2011-08-18 NOTE — Telephone Encounter (Signed)
Stop the atenolol completely and then call next week with HR.

## 2011-08-19 NOTE — Telephone Encounter (Signed)
Pt notified to stop the atenolol completely and to check her HR daily up to BID and call next week with update.  Pt voiced understanding. Jarvis Newcomer, LPN [facility]

## 2011-08-25 ENCOUNTER — Telehealth: Payer: Self-pay | Admitting: Family Medicine

## 2011-08-25 MED ORDER — OLMESARTAN MEDOXOMIL 40 MG PO TABS: 40.0000 mg | ORAL_TABLET | Freq: Every day | ORAL | Status: DC

## 2011-08-25 NOTE — Telephone Encounter (Signed)
Pt called and left message for the triage nurse.  Pt C/O bad headaches, even tried cutting her BP pill in half. Tried to reach pt and received her answer machine.  I am presuming pt is referring to her losartan 100 mg, however pt is on HCTZ as well.  Pt is requesting a different medication.  The losartan was just sent on 08-12-11. Plan:  Routed to Dr. Marlyne Beards, LPN [facility]

## 2011-08-25 NOTE — Telephone Encounter (Signed)
Will change her losartan to benicar 40. Can pick up samples if she wants to try it for a week before filling the rx.

## 2011-08-25 NOTE — Telephone Encounter (Signed)
Pt informed BP med changed to Benicar 40 mg and will pup samples today.  #14 given. Jarvis Newcomer, LPN [facility]

## 2011-08-29 ENCOUNTER — Encounter: Payer: Self-pay | Admitting: Family Medicine

## 2011-09-02 ENCOUNTER — Ambulatory Visit (INDEPENDENT_AMBULATORY_CARE_PROVIDER_SITE_OTHER): Payer: Medicare Other | Admitting: Family Medicine

## 2011-09-02 ENCOUNTER — Encounter: Payer: Self-pay | Admitting: Family Medicine

## 2011-09-02 VITALS — BP 167/78 | HR 60 | Wt 154.0 lb

## 2011-09-02 DIAGNOSIS — I10 Essential (primary) hypertension: Secondary | ICD-10-CM

## 2011-09-02 DIAGNOSIS — T887XXA Unspecified adverse effect of drug or medicament, initial encounter: Secondary | ICD-10-CM

## 2011-09-02 MED ORDER — METOPROLOL SUCCINATE ER 50 MG PO TB24: 50.0000 mg | ORAL_TABLET | Freq: Every day | ORAL | Status: DC

## 2011-09-02 NOTE — Progress Notes (Signed)
  Subjective:    Patient ID: Tina Medina, female    DOB: [birth year], 70 y.o.   MRN: [id number]  HPI Benicar was causing CP after about 3 pain. Pain started to get worse so stopped the med and now feels better. She would like to change to another medication. No other side effects with the medication. No lightheadedness or dizziness. Her chest pain has completely resolved.   Review of Systems     Objective:   Physical Exam  Constitutional: She is oriented to person, place, and time. She appears well-developed and well-nourished.  HENT:  Head: Normocephalic and atraumatic.  Cardiovascular: Normal rate, regular rhythm and normal heart sounds.   Pulmonary/Chest: Effort normal and breath sounds normal.  Neurological: She is alert and oriented to person, place, and time.  Skin: Skin is warm and dry.  Psychiatric: She has a normal mood and affect. Her behavior is normal.          Assessment & Plan:  Hypertension-I will add Benicar to her tolerance limits. She did well with atenolol in the past we'll try metoprolol extended release 50 mg. I want to recheck her blood pressure in about 3-4 weeks weeks on the medication. Then we can adjust her dose as needed. Would also consider adding a diuretic. Continue the healthy. She is already a healthy weight. Continue to monitor the salt in her diet and abide by a low-salt diet.

## 2011-09-06 ENCOUNTER — Telehealth: Payer: Self-pay | Admitting: Family Medicine

## 2011-09-06 MED ORDER — AMLODIPINE BESYLATE 2.5 MG PO TABS: 2.5000 mg | ORAL_TABLET | Freq: Every day | ORAL | Status: DC

## 2011-09-06 MED ORDER — ATENOLOL 100 MG PO TABS: 50.0000 mg | ORAL_TABLET | Freq: Two times a day (BID) | ORAL | Status: DC

## 2011-09-06 NOTE — Telephone Encounter (Signed)
Pt informed about new BP medication regimen.   Jarvis Newcomer, LPN [facility]

## 2011-09-06 NOTE — Telephone Encounter (Signed)
Tried to call the pt back, and N/A after many rings.  Tried a couple of times.  Will have to continue to try to reach. Jarvis Newcomer, LPN [facility]

## 2011-09-06 NOTE — Telephone Encounter (Signed)
Pt called this am and is complaining about the medication metoprolol she was put on last Friday.  Said she is having diarrhea and abdominal cramping everyday.  Pt stopped the med and put herself back on atenolol.  Please advise as the atenolol was not enough before. Plan:  Routed call to Dr. Marlyne Beards, LPN [facility]

## 2011-09-06 NOTE — Telephone Encounter (Signed)
Diarrhea is a potential side effect that it is strange that she can tolerate the atenolol and near both beta blockers. They're in the same family of medications. Okay to restart atenolol 100 mg. I want her to take half a tab twice a day. I will call in amlodipine for her to take with it once a day.

## 2011-10-04 ENCOUNTER — Telehealth: Payer: Self-pay | Admitting: Family Medicine

## 2011-10-04 NOTE — Telephone Encounter (Signed)
Pt called and is complaining of ? Boil type area on her gum.  She wants to know if she should come to our office or go to the dentist to be seen.  Pt notified and she says the area resembles a pimple that is red, not painful, and it has been there X 1 week.   Plan:  Pt did state she had some recent bridge work, and Personal assistant suggested for her to call the dentist since he knew would not be rubbing where pt was describing the area. Pt informed that in agreement she should first call her dentist. Jarvis Newcomer, LPN [facility]

## 2011-10-18 ENCOUNTER — Ambulatory Visit: Payer: Medicare Other | Admitting: Family Medicine

## 2011-12-14 ENCOUNTER — Emergency Department
Admission: EM | Admit: 2011-12-14 | Discharge: 2011-12-14 | Disposition: A | Discharge status: Home / Self Care | Payer: Medicare Other | Source: Home / Self Care | Attending: Family Medicine | Admitting: Family Medicine

## 2011-12-14 ENCOUNTER — Encounter: Payer: Self-pay | Admitting: *Deleted

## 2011-12-14 DIAGNOSIS — R0789 Other chest pain: Secondary | ICD-10-CM

## 2011-12-14 NOTE — ED Notes (Addendum)
Patient c/o a dull pain in her chest and neck x 1 1/2 weeks. The pain is intermittent. No other symptoms presently. She is prescribed atenolol and HCTZ which she has not been taking. She has an appointment with Dr. Linford Arnold next month. EKG done

## 2011-12-14 NOTE — ED Provider Notes (Signed)
History     CSN: [id number]  Arrival date & time 12/14/11  1137   None     Chief Complaint  Patient presents with  . Chest Pain     HPI Comments: Patient complains of 1.5 week history of intermittent dull ache in her left upper anterior chest, generally constant when present, sometimes lasting over an hour, and radiating to left shoulder.  No shortness of breath, no nausea/vomiting.  Pain is generally worse with movement.  She recalls no trauma to her chest.  Patient is a 71 y.o. female presenting with chest pain. The history is provided by the patient.  Chest Pain Episode onset: 1.5 weeks ago. Chest pain occurs intermittently. The chest pain is unchanged. The pain is associated with lifting (chest movement). At its most intense, the pain is at 3/10. The pain is currently at 1/10. The severity of the pain is mild. The quality of the pain is described as dull. The pain radiates to the  left shoulder. Chest pain is worsened by certain positions and deep breathing. Pertinent negatives for primary symptoms include no fever, no fatigue, no syncope, no shortness of breath, no cough, no wheezing, no palpitations, no abdominal pain, no nausea, no vomiting and no dizziness.  Pertinent negatives for associated symptoms include no claudication, no diaphoresis, no lower extremity edema, no near-syncope and no weakness. She tried nothing for the symptoms.  Her past medical history is significant for hyperlipidemia and hypertension.     Past Medical History  Diagnosis Date  . 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 Osteoma     2 mm of the L lid ethmoid air cells  . Hypertension   . Menopause     since age 18  . DDD (degenerative disc disease), lumbar     L spine, spinal stenosis  . Migraine   . Gastritis     H Pylori    Past Surgical History  Procedure Date  . Breast surgery 1969    Left    Family History  Problem Relation Age of Onset  . Hyperlipidemia Mother   . Hypertension Mother   . Diabetes Mother   . Cancer Father     prostate   . Hypertension Brother     History  Substance Use Topics  . Smoking status: Former Games developer  . Smokeless tobacco: Not on file  . Alcohol Use: No    OB History    Grav Para Term Preterm Abortions TAB SAB Ect Mult Living                  Review of Systems  Constitutional: Negative for fever, diaphoresis and fatigue.  HENT: Negative.   Eyes: Negative.   Respiratory: Negative for cough, chest tightness, shortness of breath and wheezing.   Cardiovascular: Positive for chest pain. Negative for palpitations, claudication, syncope and near-syncope.  Gastrointestinal: Negative for nausea, vomiting and abdominal pain.  Genitourinary: Negative.   Musculoskeletal: Negative.   Skin: Negative.   Neurological: Negative for dizziness and weakness.     Allergies  Ace inhibitors; Benicar; Losartan; Metoprolol; and Penicillins  Home Medications   Current Outpatient Rx  Name Route Sig Dispense Refill  . ACETAMINOPHEN-CODEINE 300-30 MG PO TABS Oral Take 1-2 tablets by mouth every 6 (six) hours as needed for pain. 30 tablet 0  . AMLODIPINE BESYLATE 2.5 MG PO TABS Oral Take 1 tablet (2.5 mg total) by mouth daily. 30 tablet 1  . ASPIRIN 81 MG PO CHEW Oral Chew 81 mg by mouth daily.      . ATENOLOL 100 MG PO TABS Oral Take 0.5 tablets (50 mg total) by mouth 2 (two) times daily. 30 tablet 1  . CETIRIZINE HCL 10 MG PO TABS Oral Take 10 mg by mouth daily.      [name] HYDROCHLOROTHIAZIDE 25 MG PO TABS Oral Take 25 mg by mouth daily.      . MELOXICAM  7.5 MG PO TABS  1-2 tabs po once daily with food for arthritis pain 60 tablet 2  . MULTIVITAMINS PO CAPS Oral Take 1 capsule by mouth daily.      [name] ROSUVASTATIN CALCIUM 10 MG PO TABS Oral Take 1 tablet (10 mg total) by mouth daily. 1/2 tab by mouth every other night 90 tablet 0    BP 194/97  Pulse 57  Temp(Src) 97.9 F (36.6 C) (Oral)  Resp 14  Ht 5\' 4"  (1.626 m)  Wt 156 lb (70.761 kg)  BMI 26.78 kg/m2  SpO2 100%  Physical Exam  Nursing notes and Vital Signs reviewed. Appearance:  Patient appears healthy, stated age, and in no acute distress Eyes:  Pupils are equal, round, and reactive to light and accomodation.  Extraocular movement is intact.  Conjunctivae are not inflamed   Pharynx:  Normal Neck:  Supple.  No adenopathy.  Left carotid reveals 3/6 bruit, right carotid reveals 2/6 bruit. Lungs:  Clear to auscultation.  Breath sounds are equal.  Chest:  Distinct tenderness over the left pectoralis muscle;  Palpation there during resisted flexion of the left pectoralis recreates her chest discomfort.  No tenderness over sternum. Heart:  Regular rate and rhythm without murmurs, rubs, or gallops.  Abdomen:  Nontender without masses or hepatosplenomegaly.  Bowel sounds are present.  No CVA or  flank tenderness.  Extremities:  No edema.  No calf tenderness Skin:  No rash present.   ED Course  Procedures  none  EKG:  No acute changes    1. Musculoskeletal chest pain       MDM  EKG shows no acute changes. Apply heating pad to chest several times daily.  May take Tylenol for pain Note decreased control blood pressure. For blood pressure, change Atenolol to 100mg , one-half tab twice daily.  Resume hydrochlorothiazide each morning. Followup with PCP as soon as possible.        Donna Christen, MD 12/14/11 [phone]

## 2011-12-16 ENCOUNTER — Telehealth: Payer: Self-pay | Admitting: Family Medicine

## 2011-12-26 ENCOUNTER — Encounter: Payer: Medicare Other | Admitting: Family Medicine

## 2012-01-12 ENCOUNTER — Encounter: Payer: Medicare Other | Admitting: Family Medicine

## 2012-01-12 ENCOUNTER — Encounter: Payer: Self-pay | Admitting: Family Medicine

## 2012-01-12 ENCOUNTER — Ambulatory Visit (INDEPENDENT_AMBULATORY_CARE_PROVIDER_SITE_OTHER): Payer: Medicare Other | Admitting: Family Medicine

## 2012-01-12 VITALS — BP 149/78 | HR 55 | Ht 64.0 in | Wt 155.0 lb

## 2012-01-12 DIAGNOSIS — F329 Major depressive disorder, single episode, unspecified: Secondary | ICD-10-CM

## 2012-01-12 DIAGNOSIS — F32A Depression, unspecified: Secondary | ICD-10-CM | POA: Insufficient documentation

## 2012-01-12 DIAGNOSIS — Z0000 Encounter for general adult medical examination without abnormal findings: Secondary | ICD-10-CM

## 2012-01-12 DIAGNOSIS — D320 Benign neoplasm of cerebral meninges: Secondary | ICD-10-CM

## 2012-01-12 DIAGNOSIS — I10 Essential (primary) hypertension: Secondary | ICD-10-CM

## 2012-01-12 MED ORDER — TELMISARTAN 20 MG PO TABS: 20.0000 mg | ORAL_TABLET | Freq: Every day | ORAL | Status: DC

## 2012-01-12 MED ORDER — AMBULATORY NON FORMULARY MEDICATION: Status: DC

## 2012-01-12 NOTE — Patient Instructions (Signed)
Start a regular exercise program and make sure you are eating a healthy diet Try to eat 4 servings of dairy a day or take a calcium supplement (500mg  twice a day). Your vaccines are up to date.  Try to get your shingles vaccine.

## 2012-01-12 NOTE — Progress Notes (Signed)
Subjective:    Tina Medina is a 71 y.o. female who presents for Medicare Annual/Subsequent preventive examination.  Preventive Screening-Counseling & Management  Tobacco History  Smoking status  . Former Smoker  Smokeless tobacco  . Not on file     Problems Prior to Visit 1.   Current Problems (verified) Patient Active Problem List  Diagnoses  . MENINGIOMA  . THYROID NODULE  . GOITER, MULTINODULAR  . HYPERLIPIDEMIA  . BENIGN POSITIONAL VERTIGO  . ESSENTIAL HYPERTENSION, BENIGN  . ALLERGIC RHINITIS  . GERD  . POSTMENOPAUSAL STATUS  . DERMATITIS, ATOPIC  . NECK PAIN  . SPINAL STENOSIS, LUMBAR  . NIGHT SWEATS  . CAROTID BRUIT, LEFT  . PERSONAL HISTORY OF COLONIC POLYPS  . Neck pain, chronic  . Nasopharyngitis acute    Medications Prior to Visit Current Outpatient Prescriptions on File Prior to Visit  Medication Sig Dispense Refill  . aspirin 81 MG chewable tablet Chew 81 mg by mouth daily.        [name] atenolol (TENORMIN) 100 MG tablet Take 0.5 tablets (50 mg total) by mouth 2 (two) times daily.  30 tablet  1  . hydrochlorothiazide 25 MG tablet Take 25 mg by mouth daily.        . rosuvastatin (CRESTOR) 10 MG tablet Take 1 tablet (10 mg total) by mouth daily. 1/2 tab by mouth every other night  90 tablet  0  . meloxicam (MOBIC) 7.5 MG tablet 1-2 tabs po once daily with food for arthritis pain  60 tablet  2    Current Medications (verified) Current Outpatient Prescriptions  Medication Sig Dispense Refill  . aspirin 81 MG chewable tablet Chew 81 mg by mouth daily.        [name] atenolol (TENORMIN) 100 MG tablet Take 0.5 tablets (50 mg total) by mouth 2 (two) times daily.  30 tablet  1  . hydrochlorothiazide 25 MG tablet Take 25 mg by mouth daily.        . rosuvastatin (CRESTOR) 10 MG tablet Take 1 tablet (10 mg total) by mouth daily. 1/2 tab by mouth every other night  90 tablet  0  . VITAMIN D, CHOLECALCIFEROL, PO Take by mouth.      . meloxicam (MOBIC) 7.5 MG tablet  1-2 tabs po once daily with food for arthritis pain  60 tablet  2     Allergies (verified) Ace inhibitors; Amlodipine; Benicar; Losartan; Metoprolol; and Penicillins   PAST HISTORY  Family History Family History  Problem Relation Age of Onset  . Hyperlipidemia Mother   . Hypertension Mother   . Diabetes Mother   . Cancer Father     prostate   . Hypertension Brother     Social History History  Substance Use Topics  . Smoking status: Former Games developer  . Smokeless tobacco: Not on file  . Alcohol Use: No     Are there smokers in your home (other than you)? No  Risk Factors Current exercise habits: 30 min once a weeks (correction) Dietary issues discussed: healthy diet.    Cardiac risk factors: advanced age (older than 23 for men, 39 for women) and hypertension.  Depression Screen (Note: if answer to either of the following is "Yes", a more complete depression screening is indicated)   Over the past two weeks, have you felt down, depressed or hopeless? Yes  Over the past two weeks, have you felt little interest or pleasure in doing things? Yes  Have you lost interest or pleasure  in daily life? Yes  Do you often feel hopeless? Yes  Do you cry easily over simple problems? Yes  Activities of Daily Living In your present state of health, do you have any difficulty performing the following activities?:  Driving? No Managing money?  No Feeding yourself? No Getting from bed to chair? No Climbing a flight of stairs? No Preparing food and eating?: No Bathing or showering? No Getting dressed: No Getting to the toilet? No Using the toilet:No Moving around from place to place: No In the past year have you fallen or had a near fall?:No   Are you sexually active?  No  Do you have more than one partner?  No  Hearing Difficulties: No Do you often ask people to speak up or repeat themselves? No Do you experience ringing or noises in your ears? No Do you have difficulty  understanding soft or whispered voices? No   Do you feel that you have a problem with memory? No  Do you often misplace items? No  Do you feel safe at home?  Yes  Cognitive Testing  Alert? Yes  Normal Appearance?Yes  Oriented to person? Yes  Place? Yes   Time? Yes  Recall of three objects?  Yes  Can perform simple calculations? Yes  Displays appropriate judgment?Yes  Can read the correct time from a watch face?Yes   Advanced Directives have been discussed with the patient? Yes  List the Names of Other Physician/Practitioners you currently use: 1.    Indicate any recent Medical Services you may have received from other than [facility] providers in the past year (date may be approximate).  Immunization History  Administered Date(s) Administered  . Influenza Whole 10/16/2009, 08/12/2011  . Pneumococcal Polysaccharide 02/26/2008  . Td 12/05/2004    Screening Tests Health Maintenance  Topic Date Due  . Zostavax  01/10/1999  . Influenza Vaccine  09/04/2012  . Tetanus/tdap  12/05/2014  . Colonoscopy  12/06/2014  . Pneumococcal Polysaccharide Vaccine Age 65 And Over  Completed    All answers were reviewed with the patient and necessary referrals were made:  METHENEY,CATHERINE, MD   01/12/2012   History reviewed: allergies, current medications, past family history, past medical history, past social history, past surgical history and problem list  Review of Systems A comprehensive review of systems was negative.    Objective:     Vision by Snellen chart: right eye:20/30, left eye:20/25 w/ glasses   Body mass index is 26.61 kg/(m^2). BP 149/78  Pulse 55  Ht 5\' 4"  (1.626 m)  Wt 155 lb (70.308 kg)  BMI 26.61 kg/m2  BP 149/78  Pulse 55  Ht 5\' 4"  (1.626 m)  Wt 155 lb (70.308 kg)  BMI 26.61 kg/m2  General Appearance:    Alert, cooperative, no distress, appears stated age  Head:    Normocephalic, without obvious abnormality, atraumatic  Eyes:    PERRL, conjunctiva/corneas clear,  EOM's intact,both eyes  Ears:    Normal TM's and external ear canals, both ears  Nose:   Nares normal, septum midline, mucosa normal, no drainage    or sinus tenderness  Throat:   Lips, mucosa, and tongue normal; teeth and gums normal  Neck:   Supple, symmetrical, trachea midline, no adenopathy;    thyroid:  no enlargement/tenderness/nodules; no carotid   bruit or JVD  Back:     Symmetric, no curvature, ROM normal, no CVA tenderness  Lungs:     Clear to auscultation bilaterally, respirations unlabored  Chest Wall:  No tenderness or deformity   Heart:    Regular rate and rhythm, S1 and S2 normal, no murmur, rub   or gallop  Breast Exam:    Not performed.   Abdomen:     Soft, non-tender, bowel sounds active all four quadrants,    no masses, no organomegaly  Genitalia:    Not performed  Rectal:    Not performed.   Extremities:   Extremities normal, atraumatic, no cyanosis or edema  Pulses:   2+ and symmetric all extremities  Skin:   Skin color, texture, turgor normal, no rashes or lesions  Lymph nodes:   Cervical, supraclavicularnodes normal  Neurologic:   CNII-XII intact, normal strength, reflexes    throughout       Assessment:     Annual wellness exam      Plan:     During the course of the visit the patient was educated and counseled about appropriate screening and preventive services including:    shingles vaccine  Encourage regular exercise.   She declines to be treated for depression. She declined counseling or meds.   Diet review for nutrition referral? Yes ____  Not Indicated __x__   Patient Instructions (the written plan) was given to the patient.  Medicare Attestation I have personally reviewed: The patient's medical and social history Their use of alcohol, tobacco or illicit drugs Their current medications and supplements The patient's functional ability including ADLs,fall risks, home safety risks, cognitive, and hearing and visual impairment Diet and  physical activities Evidence for depression or mood disorders  HTN - Will try adding 20mg  of micardis daily. F/U in 1 month for BP.   The patient's weight, height, BMI, and visual acuity have been recorded in the chart.  I have made referrals, counseling, and provided education to the patient based on review of the above and I have provided the patient with a written personalized care plan for preventive services.     METHENEY,CATHERINE, MD   01/12/2012

## 2012-01-16 ENCOUNTER — Telehealth: Payer: Self-pay | Admitting: *Deleted

## 2012-01-16 DIAGNOSIS — I10 Essential (primary) hypertension: Secondary | ICD-10-CM

## 2012-01-16 NOTE — Telephone Encounter (Signed)
OK. Will refer to cardiology to further evaluation. I am really not sure what else to put her on but we have to control her BP.

## 2012-01-16 NOTE — Telephone Encounter (Signed)
Pt informed

## 2012-01-16 NOTE — Telephone Encounter (Signed)
Pt states that since she has been taking the Micardis that she has been having back ache,HA and chest pain. Please advise.

## 2012-01-17 LAB — LIPID PANEL
Cholesterol: 138 mg/dL (ref 0–200)
VLDL: 22 mg/dL (ref 0–40)

## 2012-01-17 LAB — COMPLETE METABOLIC PANEL WITH GFR
AST: 22 U/L (ref 0–37)
Albumin: 3.9 g/dL (ref 3.5–5.2)
BUN: 13 mg/dL (ref 6–23)
Calcium: 9.7 mg/dL (ref 8.4–10.5)
Chloride: 108 mEq/L (ref 96–112)
Glucose, Bld: 89 mg/dL (ref 70–99)
Potassium: 4.5 mEq/L (ref 3.5–5.3)

## 2012-01-17 NOTE — Progress Notes (Signed)
Quick Note:  All labs are normal. ______ 

## 2012-01-25 ENCOUNTER — Other Ambulatory Visit: Payer: Self-pay | Admitting: *Deleted

## 2012-01-25 MED ORDER — ATENOLOL 100 MG PO TABS: 50.0000 mg | ORAL_TABLET | Freq: Two times a day (BID) | ORAL | Status: DC

## 2012-02-22 ENCOUNTER — Encounter: Payer: Self-pay | Admitting: Cardiology

## 2012-02-22 ENCOUNTER — Ambulatory Visit (INDEPENDENT_AMBULATORY_CARE_PROVIDER_SITE_OTHER): Payer: Medicare Other | Admitting: Cardiology

## 2012-02-22 VITALS — BP 142/90 | HR 56 | Ht 64.0 in | Wt 159.0 lb

## 2012-02-22 DIAGNOSIS — I10 Essential (primary) hypertension: Secondary | ICD-10-CM

## 2012-02-22 DIAGNOSIS — E785 Hyperlipidemia, unspecified: Secondary | ICD-10-CM

## 2012-02-22 NOTE — Patient Instructions (Signed)
Your physician recommends that you schedule a follow-up appointment in: 6 WEEKS  INCREASE HCTZ TO EVERYDAY  Your physician recommends that you return for lab work in: ONE WEEK

## 2012-02-22 NOTE — Assessment & Plan Note (Signed)
Management per primary care. 

## 2012-02-22 NOTE — Assessment & Plan Note (Signed)
Blood pressure is mildly elevated today. Her heart rate is in the 50s. Continue present dose of atenolol. She is only taking HCTZ every other day. I will change to every day. Note her renal function is normal on recent laboratories. Check potassium and renal function in one week after increasing diuretic. She will monitor her blood pressure at home and we will adjust as needed. If it remains elevated we could consider hydralazine or changing HCTZ to spironolactone. We could also reattempt Norvasc. She has had intolerances to ACE inhibitors and ARB. [name] lifestyle modification. She will follow a low-sodium diet and continue exercise. She does not smoke and does not consume excessive amounts of alcohol.

## 2012-02-22 NOTE — Progress Notes (Signed)
HPI: Pleasant 71 year old female for evaluation of hypertension. Patient states she has had hypertension since age 61. She denies dyspnea on exertion, orthopnea, PND, pedal edema, palpitations, syncope or chest pain. Patient has had medication intolerances including cough with ACE inhibitors, intolerance to Norvasc and ARBs. Her blood pressure is mildly elevated and we were asked to evaluate.  Current Outpatient Prescriptions  Medication Sig Dispense Refill  . AMBULATORY NON FORMULARY MEDICATION Medication Name: Zostavax IM x 1  1 vial  0  . aspirin 81 MG chewable tablet Chew 81 mg by mouth daily.        [name] atenolol (TENORMIN) 100 MG tablet Take 0.5 tablets (50 mg total) by mouth 2 (two) times daily.  90 tablet  0  . hydrochlorothiazide 25 MG tablet Take 25 mg by mouth daily.        . meloxicam (MOBIC) 7.5 MG tablet Take 7.5 mg by mouth as needed. 1-2 tabs po once daily with food for arthritis pain      . rosuvastatin (CRESTOR) 10 MG tablet Take 1 tablet (10 mg total) by mouth daily. 1/2 tab by mouth every other night  90 tablet  0  . VITAMIN D, CHOLECALCIFEROL, PO Take by mouth.        Allergies  Allergen Reactions  . Ace Inhibitors     REACTION: cough  . Amlodipine   . Benicar (Olmesartan Medoxomil) Other (See Comments)    Chest pain  . Losartan Other (See Comments)    headache  . Metoprolol Diarrhea    Abdominal cramping.   . Micardis (Telmisartan) Other (See Comments)    Chest Pain, Back Pain.   [name] Penicillins     REACTION: rash    Past Medical History  Diagnosis Date  . 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 Osteoma     2 mm of the L lid ethmoid air cells  . Hypertension   . Menopause     since age 34  . DDD (degenerative disc disease), lumbar     L spine, spinal stenosis  . Migraine   . Gastritis     H Pylori  . Hyperlipidemia     Past Surgical History  Procedure Date  . Breast surgery  1969    Left    History   Social History  . Marital Status: Married    Spouse Name: N/A    Number of Children: 3  . Years of Education: N/A   Occupational History  .      Retired   Social History Main Topics  . Smoking status: Former Games developer  . Smokeless tobacco: Not on file  . Alcohol Use: Yes     Occasional glass of wine  . Drug Use: No  . Sexually Active: Not on file   Other Topics Concern  . Not on file   Social History Narrative  . No narrative on file    Family History  Problem Relation Age of Onset  . Hyperlipidemia Mother   . Hypertension Mother   . Diabetes Mother   . Cancer Father     prostate   . Hypertension Brother     ROS:  no fevers or chills, productive cough, hemoptysis, dysphasia, odynophagia, melena, hematochezia, dysuria, hematuria, rash, seizure activity, orthopnea, PND, pedal edema, claudication. Remaining systems are negative.  Physical Exam:   Blood pressure 142/90, pulse 56, height 5'  4" (1.626 m), weight 159 lb (72.122 kg).  General:  Well developed/well nourished in NAD Skin warm/dry Patient not depressed No peripheral clubbing Back-normal HEENT-normal/normal eyelids Neck supple/normal carotid upstroke bilaterally; no bruits; no JVD; no thyromegaly chest - CTA/ normal expansion CV - RRR/normal S1 and S2; no murmurs, rubs or gallops;  PMI nondisplaced Abdomen -NT/ND, no HSM, no mass, + bowel sounds, no bruit 2+ femoral pulses, no bruits Ext-no edema, chords, 2+ DP Neuro-grossly nonfocal  ECG 12/14/2011-sinus bradycardia at a rate of 52. RV conduction delay. No ST changes.

## 2012-02-23 ENCOUNTER — Telehealth: Payer: Self-pay | Admitting: *Deleted

## 2012-02-23 ENCOUNTER — Other Ambulatory Visit: Payer: Self-pay | Admitting: *Deleted

## 2012-02-23 MED ORDER — HYDROCHLOROTHIAZIDE 25 MG PO TABS: 25.0000 mg | ORAL_TABLET | Freq: Every day | ORAL | Status: DC

## 2012-02-23 NOTE — Telephone Encounter (Signed)
OK we can order all the labs in 6 weeks and have copy sent to him to review.

## 2012-02-23 NOTE — Telephone Encounter (Signed)
Pt states she went to the cardiologist and he basically told her what you have already and states that she is concerned about paying the cardiologist $35 everytime. States that he wants lab work in 6 weeks but says that you are checking the labs he is ordering. States she finds it hard to pay some other provider to say and do what you have done.

## 2012-02-23 NOTE — Telephone Encounter (Signed)
Pt informed on VM

## 2012-02-28 ENCOUNTER — Telehealth: Payer: Self-pay | Admitting: *Deleted

## 2012-02-28 NOTE — Telephone Encounter (Signed)
Pt calls and states that she is not gonna get the labs as she feels it is not necessay but would like to pick up the Crestor here at the office this afternoon. KJ LPN

## 2012-03-21 ENCOUNTER — Other Ambulatory Visit: Payer: Self-pay | Admitting: Family Medicine

## 2012-03-21 DIAGNOSIS — Z1231 Encounter for screening mammogram for malignant neoplasm of breast: Secondary | ICD-10-CM

## 2012-03-27 ENCOUNTER — Ambulatory Visit
Admission: RE | Admit: 2012-03-27 | Discharge: 2012-03-27 | Disposition: A | Discharge status: Home / Self Care | Payer: Medicare Other | Source: Ambulatory Visit | Attending: Family Medicine | Admitting: Family Medicine

## 2012-03-27 DIAGNOSIS — Z1231 Encounter for screening mammogram for malignant neoplasm of breast: Secondary | ICD-10-CM

## 2012-04-04 ENCOUNTER — Ambulatory Visit: Payer: Medicare Other | Admitting: Cardiology

## 2012-07-16 ENCOUNTER — Ambulatory Visit (INDEPENDENT_AMBULATORY_CARE_PROVIDER_SITE_OTHER): Payer: Medicare Other

## 2012-07-16 ENCOUNTER — Encounter: Payer: Self-pay | Admitting: Family Medicine

## 2012-07-16 ENCOUNTER — Ambulatory Visit (INDEPENDENT_AMBULATORY_CARE_PROVIDER_SITE_OTHER): Payer: Medicare Other | Admitting: Family Medicine

## 2012-07-16 VITALS — BP 172/80 | HR 53 | Wt 153.0 lb

## 2012-07-16 DIAGNOSIS — M25561 Pain in right knee: Secondary | ICD-10-CM

## 2012-07-16 DIAGNOSIS — M546 Pain in thoracic spine: Secondary | ICD-10-CM

## 2012-07-16 DIAGNOSIS — M25531 Pain in right wrist: Secondary | ICD-10-CM

## 2012-07-16 DIAGNOSIS — M5126 Other intervertebral disc displacement, lumbar region: Secondary | ICD-10-CM

## 2012-07-16 DIAGNOSIS — I10 Essential (primary) hypertension: Secondary | ICD-10-CM

## 2012-07-16 DIAGNOSIS — M25569 Pain in unspecified knee: Secondary | ICD-10-CM

## 2012-07-16 DIAGNOSIS — D320 Benign neoplasm of cerebral meninges: Secondary | ICD-10-CM

## 2012-07-16 DIAGNOSIS — D329 Benign neoplasm of meninges, unspecified: Secondary | ICD-10-CM

## 2012-07-16 DIAGNOSIS — M898X9 Other specified disorders of bone, unspecified site: Secondary | ICD-10-CM

## 2012-07-16 DIAGNOSIS — M5137 Other intervertebral disc degeneration, lumbosacral region: Secondary | ICD-10-CM

## 2012-07-16 DIAGNOSIS — M25539 Pain in unspecified wrist: Secondary | ICD-10-CM

## 2012-07-16 DIAGNOSIS — S96911A Strain of unspecified muscle and tendon at ankle and foot level, right foot, initial encounter: Secondary | ICD-10-CM

## 2012-07-16 NOTE — Progress Notes (Signed)
Subjective:    Patient ID: Tina Medina, female    DOB: 08-06-1941, 71 y.o.   MRN: [id number]  HPI HTN - no CP or SOb. Taking meds. No NSAIDs.  On ABX for her tooth. Some pain in her teeth currently. No headaches or palpitations.  Hit her big toe about a months ago and since then notices a dialted vein  In her right lower leg and feels her ankle is getting weak.  No swelling.  No old injuries or bad sprains.  She says she has one episode where she fell at her ankle most gave out but it has not happened since.  Right wrist occ feels weak. She also had one episode where she went to grab something and felt like her wrist was so weak that she may stop that. She says that has not happened again since then. Says persistant weakness.  Wrist will feel numb.  No worsening sxs.  No swelling.    Right knee pain for about a month as well  . Says feels like it is grinding at time. Worse with walking an better with rest. Did take IBU one day - did help.  No swelling. No old or recent injuries.   She also notes that her lower spine seems to be curving more inward than it used to. [name] noticed in the near and thinks it is very strange. She denies any actual back pain.  Review of Systems     Objective:   Physical Exam  Constitutional: She is oriented to person, place, and time. She appears well-developed and well-nourished.  HENT:  Head: Normocephalic and atraumatic.  Cardiovascular: Normal rate, regular rhythm and normal heart sounds.   Pulmonary/Chest: Effort normal and breath sounds normal.  Musculoskeletal:       Right ankle with normal range of motion. Strength is 5 out of 5. No laxity. Nontender over the joint itself. Dorsal pedal pulse 2+. She does have some slight hyperpigmentation going up the ankle. No bulging veins. No erythema or streaking. Right knee with normal range of motion. No increased laxity. No crepitus with flexion and extension. Strength is 5 out of 5. Right wrist with normal range  of motion. Nontender over the wrist joint. Negative Tinel's and Phalen's sign. Radial pulse 2+.  Neurological: She is alert and oriented to person, place, and time.  Skin: Skin is warm and dry.  Psychiatric: She has a normal mood and affect. Her behavior is normal.          Assessment & Plan:  HTN - uncontrolled today. She says her home blood pressures have been well controlled. I encouraged her to followup in one week and she can also bring in her home blood pressure cuff if she would like. If she's not at goal we will adjust her medication at that time.  Right ankle pan- her exam is normal at this point. [name] continue to monitoring it over the next 3-4 weeks. Continue with some range of motion and stretches. [name] not having any pain.  Right knee pain - Will  Get xray today. She does have grinding with walking and standing. Likely has some osteoarthritis. It feels better with rest. She can use an anti-inflammatory as needed once we get her blood pressure down since it has helped in the past.  Right wrist numbness-unclear etiology at this point in time. No evidence of carpal tunnel syndrome. It only bothers her it infrequently. There are no actual triggers. There is no actual  swelling on exam. We will continue to monitor this as well and the next few weeks.  thoracic spine is curving in.  Admittedly she does seem to have a spine occurs very inward. She has no prior history per her report of scoliosis. We will get x-rays today. She has no significant pain. Last bone density test was this year.   Meningioma-she is due to for followup MRI in August of this year. We'll go ahead and place the order.

## 2012-07-17 ENCOUNTER — Telehealth: Payer: Self-pay | Admitting: *Deleted

## 2012-07-17 DIAGNOSIS — I10 Essential (primary) hypertension: Secondary | ICD-10-CM

## 2012-07-17 DIAGNOSIS — D329 Benign neoplasm of meninges, unspecified: Secondary | ICD-10-CM

## 2012-07-17 NOTE — Telephone Encounter (Signed)
MRI Brain with contrast has been approved with prior auth. Prior auth # [id number] good from 07/17/2012 to 08/15/2012.

## 2012-07-18 LAB — BASIC METABOLIC PANEL
CO2: 30 mEq/L (ref 19–32)
Calcium: 9.5 mg/dL (ref 8.4–10.5)
Sodium: 144 mEq/L (ref 135–145)

## 2012-07-18 NOTE — Telephone Encounter (Signed)
Quick Note:  All labs are normal. ______ 

## 2012-07-23 ENCOUNTER — Ambulatory Visit (INDEPENDENT_AMBULATORY_CARE_PROVIDER_SITE_OTHER): Payer: Medicare Other | Admitting: Family Medicine

## 2012-07-23 VITALS — BP 146/80 | HR 52 | Wt 153.0 lb

## 2012-07-23 DIAGNOSIS — I10 Essential (primary) hypertension: Secondary | ICD-10-CM

## 2012-07-23 MED ORDER — DILTIAZEM HCL ER 60 MG PO CP12: 60.0000 mg | ORAL_CAPSULE | Freq: Two times a day (BID) | ORAL | Status: DC

## 2012-07-23 NOTE — Patient Instructions (Addendum)
Will add diltiazem. Follow up with me in 3 weeks.

## 2012-07-23 NOTE — Progress Notes (Signed)
Patient ID: Tina Medina, female   DOB: March 09, 1941, 71 y.o.   MRN: [id number]  Pt denies chest pain, SOB, dizziness, or heart palpitations.  Taking meds as directed w/o problems. Pt did not bring cuff from home today but reports morning reading as 118/78.   Denies medication side effects.  5 min spent with pt.   HTN- not well controlled. Add diltiazema. Recheck BP in one month with me.  Nani Gasser, MD

## 2012-07-28 ENCOUNTER — Inpatient Hospital Stay (HOSPITAL_BASED_OUTPATIENT_CLINIC_OR_DEPARTMENT_OTHER): Admission: RE | Admit: 2012-07-28 | Payer: Medicare Other | Source: Ambulatory Visit

## 2012-07-31 ENCOUNTER — Ambulatory Visit (HOSPITAL_BASED_OUTPATIENT_CLINIC_OR_DEPARTMENT_OTHER)
Admission: RE | Admit: 2012-07-31 | Discharge: 2012-07-31 | Disposition: A | Discharge status: Home / Self Care | Payer: Medicare Other | Source: Ambulatory Visit | Attending: Family Medicine | Admitting: Family Medicine

## 2012-07-31 DIAGNOSIS — D329 Benign neoplasm of meninges, unspecified: Secondary | ICD-10-CM

## 2012-08-01 ENCOUNTER — Telehealth: Payer: Self-pay | Admitting: Family Medicine

## 2012-08-01 MED ORDER — ALPRAZOLAM 0.5 MG PO TABS: 0.5000 mg | ORAL_TABLET | Freq: Every day | ORAL | Status: AC | PRN

## 2012-08-01 NOTE — Telephone Encounter (Signed)
Patient called advised that she had an Mri but was not able to complete it because her heart started racing and she couldn't do it. She needed to know what you would suggest to calm her down. Thanks

## 2012-08-01 NOTE — Telephone Encounter (Signed)
I will send her a prescription for Xanax. She can take 1 about an hour before the procedure and then repeat again in one hour if needed. This will help calm her nerves.

## 2012-08-01 NOTE — Telephone Encounter (Signed)
Patient advise on Xanax for MRI

## 2012-08-09 ENCOUNTER — Telehealth: Payer: Self-pay | Admitting: Family Medicine

## 2012-08-09 NOTE — Telephone Encounter (Signed)
Patient aware to pick up Xanax before doing MRI.

## 2012-08-11 ENCOUNTER — Ambulatory Visit (HOSPITAL_BASED_OUTPATIENT_CLINIC_OR_DEPARTMENT_OTHER)
Admission: RE | Admit: 2012-08-11 | Discharge: 2012-08-11 | Disposition: A | Discharge status: Home / Self Care | Payer: Medicare Other | Source: Ambulatory Visit | Attending: Family Medicine | Admitting: Family Medicine

## 2012-08-11 DIAGNOSIS — R51 Headache: Secondary | ICD-10-CM | POA: Insufficient documentation

## 2012-08-11 DIAGNOSIS — D320 Benign neoplasm of cerebral meninges: Secondary | ICD-10-CM | POA: Insufficient documentation

## 2012-08-11 MED ORDER — GADOBENATE DIMEGLUMINE 529 MG/ML IV SOLN
15.0000 mL | Freq: Once | INTRAVENOUS | Status: AC | PRN
  Administered 2012-08-11: 15 mL via INTRAVENOUS

## 2012-08-13 ENCOUNTER — Encounter: Payer: Self-pay | Admitting: Family Medicine

## 2012-08-13 ENCOUNTER — Ambulatory Visit (INDEPENDENT_AMBULATORY_CARE_PROVIDER_SITE_OTHER): Payer: Medicare Other | Admitting: Family Medicine

## 2012-08-13 VITALS — BP 157/85 | HR 49 | Wt 155.0 lb

## 2012-08-13 DIAGNOSIS — D329 Benign neoplasm of meninges, unspecified: Secondary | ICD-10-CM | POA: Insufficient documentation

## 2012-08-13 DIAGNOSIS — L299 Pruritus, unspecified: Secondary | ICD-10-CM

## 2012-08-13 DIAGNOSIS — F39 Unspecified mood [affective] disorder: Secondary | ICD-10-CM

## 2012-08-13 DIAGNOSIS — I10 Essential (primary) hypertension: Secondary | ICD-10-CM

## 2012-08-13 DIAGNOSIS — D320 Benign neoplasm of cerebral meninges: Secondary | ICD-10-CM

## 2012-08-13 MED ORDER — BUPROPION HCL ER (XL) 150 MG PO TB24: 150.0000 mg | ORAL_TABLET | Freq: Every day | ORAL | Status: DC

## 2012-08-13 NOTE — Progress Notes (Addendum)
  Subjective:    Patient ID: Tina Medina, female    DOB: [birth year], 71 y.o.   MRN: [id number]  HPI  Hypertension followup-we started diltiazem her last office visit. She said it gave her sharp shooting pain into her head. She finally discontinued after couple weeks. She brought in her old pills today. She says the headaches have now stopped since she stopped the medication. Unfortunately she's been intolerant to multiple blood pressure pills. She strongly feels that her high blood pressure is related to her stress. 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 She does find this very frustrating at times and thinks it could be contributing to her high numbers. She is taking her atenolol and Mevacor Dyazide regularly.  Meningioma-she just had her repeat MRI of the weekend to make sure that her meningioma that was seen on a head scan in 2010 with still normal. The results confirm that there's been no change in the meningioma which is very reassuring.  She complains of itching in her lower legs. Going on for months. It tends to be worse in the evenings. She denies any actual rash. She has tried several over-the-counter creams including hydrocortisone without any significant relief. She says rubbing them does seem to help a little bit.  Review of Systems     Objective:   Physical Exam  Constitutional: She is oriented to person, place, and time. She appears well-developed and well-nourished.  HENT:  Head: Normocephalic and atraumatic.  Cardiovascular: Normal rate, regular rhythm and normal heart sounds.   Pulmonary/Chest: Effort normal and breath sounds normal.  Neurological: She is alert and oriented to person, place, and time.  Skin: Skin is warm and dry.  Psychiatric: She has a normal mood and affect. Her behavior is normal.          Assessment & Plan:  Hypertension-uncontrolled today. I did discuss options with her that included adjusting her blood pressure medication. She  is very concerned her blood pressure is high because of her mood and her stress. [name] very resistant to adding a new medication because she has had so many problems with other blood pressure pills. Certainly we could consider trying to treat her mood the next 4-6 weeks and see if this makes improvement her blood pressure though I suspect it will not. I think she has 2 distinct issues going on. For now though we will start Wellbutrin 150 mg extended release once a day. [name] potential side effects. She'll call me if she has any problems. Otherwise followup in 6 weeks. If blood pressures not well controlled at that point time we will consider either increasing her HCTZ to 50 mg or consider an alpha-blocker.   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 We will start Wellbutrin. Discussed potential side effects. Followup in 6 weeks.  Meningioma-repeat MRI is reassuring. No changes since 2010 which is fantastic.  Itching of lower extremities-unclear etiology. Consider liver disease though this is usually systemic but we will recheck her liver enzymes today. Also check thyroid. We'll also check a CBC. I see no actual rash on her lower extremities today. She does have a history spinal stenosis.

## 2012-08-13 NOTE — Addendum Note (Signed)
Addended by: Nani Gasser D on: 08/13/2012 12:44 PM   Modules accepted: Orders

## 2012-08-14 LAB — CBC WITH DIFFERENTIAL/PLATELET
HCT: 37.8 % (ref 36.0–46.0)
Hemoglobin: 13.1 g/dL (ref 12.0–15.0)
Lymphocytes Relative: 33 % (ref 12–46)
Lymphs Abs: 1.9 10*3/uL (ref 0.7–4.0)
Monocytes Absolute: 0.5 10*3/uL (ref 0.1–1.0)
Monocytes Relative: 10 % (ref 3–12)
Neutro Abs: 3.1 10*3/uL (ref 1.7–7.7)
Neutrophils Relative %: 54 % (ref 43–77)
RBC: 4.13 MIL/uL (ref 3.87–5.11)
WBC: 5.7 10*3/uL (ref 4.0–10.5)

## 2012-08-14 LAB — SEDIMENTATION RATE: Sed Rate: 3 mm/h (ref 0–22)

## 2012-08-14 LAB — HEPATIC FUNCTION PANEL
ALT: 14 U/L (ref 0–35)
AST: 21 U/L (ref 0–37)
Albumin: 4.2 g/dL (ref 3.5–5.2)
Alkaline Phosphatase: 68 U/L (ref 39–117)
Bilirubin, Direct: 0.1 mg/dL (ref 0.0–0.3)
Indirect Bilirubin: 0.4 mg/dL (ref 0.0–0.9)
Total Bilirubin: 0.5 mg/dL (ref 0.3–1.2)
Total Protein: 6.7 g/dL (ref 6.0–8.3)

## 2012-08-14 LAB — TSH: TSH: 1.185 u[IU]/mL (ref 0.350–4.500)

## 2012-08-20 ENCOUNTER — Ambulatory Visit (INDEPENDENT_AMBULATORY_CARE_PROVIDER_SITE_OTHER): Payer: Medicare Other | Admitting: Family Medicine

## 2012-08-20 ENCOUNTER — Other Ambulatory Visit: Payer: Self-pay | Admitting: Family Medicine

## 2012-08-20 DIAGNOSIS — Z23 Encounter for immunization: Secondary | ICD-10-CM

## 2012-08-20 MED ORDER — ATENOLOL 100 MG PO TABS: 50.0000 mg | ORAL_TABLET | Freq: Two times a day (BID) | ORAL | Status: DC

## 2012-08-20 MED ORDER — ROSUVASTATIN CALCIUM 10 MG PO TABS: 10.0000 mg | ORAL_TABLET | Freq: Every day | ORAL | Status: DC

## 2012-08-20 NOTE — Progress Notes (Signed)
  Subjective:    Patient ID: Tina Medina, female    DOB: 1941/03/02, 70 y.o.   MRN: [id number]  HPI Here for flu shot.   Review of Systems     Objective:   Physical Exam        Assessment & Plan:

## 2012-08-29 ENCOUNTER — Telehealth: Payer: Self-pay | Admitting: *Deleted

## 2012-08-29 NOTE — Telephone Encounter (Signed)
Question for patient-has she taken Lipitor Med before per Insurance review

## 2012-08-30 ENCOUNTER — Telehealth: Payer: Self-pay | Admitting: Family Medicine

## 2012-08-30 ENCOUNTER — Telehealth: Payer: Self-pay | Admitting: *Deleted

## 2012-08-30 NOTE — Telephone Encounter (Signed)
Patient states Crestor has been approved per pharm

## 2012-08-30 NOTE — Telephone Encounter (Signed)
Pt. Called Wed 08-30-12 at 4:59 and left a message stating she was returning Jo's Phone call and for Alvino Chapel to call her back please. Thanks, [employer]

## 2012-08-30 NOTE — Telephone Encounter (Signed)
Left message on if she had tried Lipitor in the past

## 2012-08-30 NOTE — Telephone Encounter (Signed)
Pt called back and wants to speak to you about the crestor.  Can you please return her call?  Thanks

## 2012-09-24 ENCOUNTER — Encounter: Payer: Self-pay | Admitting: Family Medicine

## 2012-09-24 ENCOUNTER — Ambulatory Visit (INDEPENDENT_AMBULATORY_CARE_PROVIDER_SITE_OTHER): Payer: Medicare Other | Admitting: Family Medicine

## 2012-09-24 ENCOUNTER — Other Ambulatory Visit: Payer: Self-pay | Admitting: Family Medicine

## 2012-09-24 VITALS — BP 156/77 | HR 50 | Ht 64.0 in | Wt 154.0 lb

## 2012-09-24 DIAGNOSIS — R1031 Right lower quadrant pain: Secondary | ICD-10-CM

## 2012-09-24 DIAGNOSIS — I10 Essential (primary) hypertension: Secondary | ICD-10-CM

## 2012-09-24 DIAGNOSIS — R109 Unspecified abdominal pain: Secondary | ICD-10-CM

## 2012-09-24 DIAGNOSIS — Z733 Stress, not elsewhere classified: Secondary | ICD-10-CM

## 2012-09-24 DIAGNOSIS — F439 Reaction to severe stress, unspecified: Secondary | ICD-10-CM

## 2012-09-24 NOTE — Progress Notes (Signed)
  Subjective:    Patient ID: Tina Medina, female    DOB: December 03, 1941, 71 y.o.   MRN: [id number]  HPI HTN - still tries to take her atenolol but says she doesn't feel well when she takes it. She gets sharp shooting headaches and feels more fatigued. She also gets a rash sometimes. She says she does feel better when she skips it but she does try to get some. She does take her HCTZ consistently.no SP or SOB.   Stress/Anxiety - Stopped wellbutrin bc of diarrhea.  Only took it for a couple of days.   Was having sharp stabbling pain in there RLQ x 1 year, then there other days noticed it on her left side.  Lasted for seconds. No change in bowerls.  No urinary sxs. No fever.  No blood in the stool.  Still has her ovaries. 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    Review of Systems     Objective:   Physical Exam  Constitutional: She is oriented to person, place, and time. She appears well-developed and well-nourished.  HENT:  Head: Normocephalic and atraumatic.  Cardiovascular: Normal rate, regular rhythm and normal heart sounds.   Pulmonary/Chest: Effort normal and breath sounds normal.  Abdominal: Soft. Bowel sounds are normal. She exhibits no distension and no mass. There is tenderness. There is no rebound and no guarding.       Hyperactive BS. Tender in the lateral RLQ.   Neurological: She is alert and oriented to person, place, and time.  Skin: Skin is warm and dry.  Psychiatric: She has a normal mood and affect. Her behavior is normal.          Assessment & Plan:  HTN - uncontrolled. Will try holding the atenolol and gave her samples of that 5 mg to take daily for 2 weeks. If she feels better on it compared to the atenolol but I would like to continue this medication might even be able to increase her dose which would get better control of her blood pressure. Followup in one month. If she's doing well in 2 weeks and call for 2 weeks more of samples. Please call sooner if she needs to stop the medication  for any reason. Additionally she has tried multiple blood pressure pills and has not tolerated them well.  Stress/anxiety - Wellbutrin caused diarrhea. She declines taking her medications for me. Did offer counseling but she declined. She is getting her husband to the senior center for a few hours twice a week and getting him out of the home for a while has been helpful as a stress reliever for her.  Right lower quadrant pain-unclear etiology suspect bowel since she also has pain on the left side as well. On exam she was only tender along the right lower quadrant. We'll schedule for ultrasound to evaluate pelvic organs. If normal consider CT for further evaluation if needed.

## 2012-10-01 ENCOUNTER — Telehealth: Payer: Self-pay | Admitting: *Deleted

## 2012-10-01 ENCOUNTER — Ambulatory Visit (HOSPITAL_BASED_OUTPATIENT_CLINIC_OR_DEPARTMENT_OTHER)
Admission: RE | Admit: 2012-10-01 | Discharge: 2012-10-01 | Disposition: A | Discharge status: Home / Self Care | Payer: Medicare Other | Source: Ambulatory Visit | Attending: Family Medicine | Admitting: Family Medicine

## 2012-10-01 DIAGNOSIS — R109 Unspecified abdominal pain: Secondary | ICD-10-CM | POA: Insufficient documentation

## 2012-10-01 NOTE — Telephone Encounter (Signed)
Patient aware of [place] normal of Abd.  Pelvic US had not been done yet

## 2012-10-01 NOTE — Telephone Encounter (Signed)
Message copied by Florestine Avers on Mon Oct 01, 2012  4:21 PM ------      Message from: Nani Gasser D      Created: Mon Oct 01, 2012  3:41 PM       Call pt: [place] of ab is normal but [facility]' have results on pelvic US yet.

## 2012-10-03 ENCOUNTER — Telehealth: Payer: Self-pay | Admitting: *Deleted

## 2012-10-03 NOTE — Telephone Encounter (Signed)
It is okay to call imaging and canceled the order. If her pain persists then certainly we can reschedule at another time.

## 2012-10-03 NOTE — Telephone Encounter (Signed)
Patient called and requested Dr. Linford Arnold to cancel vaginal [place]- she called and they told her the MD had to cancel the order She states" she has other bills she is paying on and can not do this right now.

## 2012-10-04 ENCOUNTER — Encounter: Payer: Self-pay | Admitting: Sports Medicine

## 2012-10-04 ENCOUNTER — Ambulatory Visit (INDEPENDENT_AMBULATORY_CARE_PROVIDER_SITE_OTHER): Payer: Medicare Other | Admitting: Sports Medicine

## 2012-10-04 ENCOUNTER — Ambulatory Visit (INDEPENDENT_AMBULATORY_CARE_PROVIDER_SITE_OTHER): Payer: Medicare Other

## 2012-10-04 VITALS — BP 210/87 | HR 57 | Wt 153.0 lb

## 2012-10-04 DIAGNOSIS — R05 Cough: Secondary | ICD-10-CM

## 2012-10-04 DIAGNOSIS — J209 Acute bronchitis, unspecified: Secondary | ICD-10-CM | POA: Insufficient documentation

## 2012-10-04 DIAGNOSIS — R0989 Other specified symptoms and signs involving the circulatory and respiratory systems: Secondary | ICD-10-CM

## 2012-10-04 MED ORDER — AZITHROMYCIN 250 MG PO TABS: ORAL_TABLET | ORAL | Status: DC

## 2012-10-04 MED ORDER — HYDROCOD POLST-CHLORPHEN POLST 10-8 MG/5ML PO LQCR: 5.0000 mL | Freq: Two times a day (BID) | ORAL | Status: DC | PRN

## 2012-10-04 NOTE — Progress Notes (Signed)
Subjective:    CC: Cough  HPI: Tina Medina is a very pleasant 71 year old female who comes in with a five-day history of cough, productive of yellowish sputum that keeps her up at night. She has had multiple sick contacts including her dentist. She notes significant rhinorrhea, no sinus pressure, no sore throat, no nausea, vomiting, or diarrhea. She denies any rashes. Cough is her worst symptom, but she has no shortness of breath. She has tried some over-the-counter medications without much improvement.  Past medical history, Surgical history, Family history, Social history, Allergies, and medications have been entered into the medical record, reviewed, and no changes needed.   Review of Systems: No fevers, chills, night sweats, weight loss, chest pain, or shortness of breath.   Objective:    General: Well Developed, well nourished, and in no acute distress.  Neuro: Alert and oriented x3, extra-ocular muscles intact.  HEENT: Normocephalic, atraumatic, pupils equal round reactive to light, neck supple, no masses, no lymphadenopathy, thyroid nonpalpable.  external and internal ear exam shows cerumen impaction on the left side. There is no oropharyngeal erythema, nasopharynx is unremarkable. Skin: Warm and dry, no rashes. Cardiac: Regular rate and rhythm, no murmurs rubs or gallops.  Respiratory: Clear to auscultation bilaterally. Not using accessory muscles, speaking in full sentences. There are slightly decreased breath sounds in the left upper lung field.  Chest x-ray shows no infiltrate, and increased lung markings, but may be related to bronchitic-type changes.  Impression and Recommendations:

## 2012-10-04 NOTE — Assessment & Plan Note (Signed)
Azithromycin, Tussionex. Chest x-ray. Return to clinic as needed

## 2012-10-05 ENCOUNTER — Ambulatory Visit (HOSPITAL_BASED_OUTPATIENT_CLINIC_OR_DEPARTMENT_OTHER): Payer: Medicare Other

## 2012-10-05 ENCOUNTER — Other Ambulatory Visit (HOSPITAL_BASED_OUTPATIENT_CLINIC_OR_DEPARTMENT_OTHER): Payer: Medicare Other

## 2012-10-22 ENCOUNTER — Ambulatory Visit (INDEPENDENT_AMBULATORY_CARE_PROVIDER_SITE_OTHER): Payer: Medicare Other | Admitting: Family Medicine

## 2012-10-22 ENCOUNTER — Telehealth: Payer: Self-pay | Admitting: *Deleted

## 2012-10-22 ENCOUNTER — Encounter: Payer: Self-pay | Admitting: Family Medicine

## 2012-10-22 VITALS — BP 171/89 | HR 70 | Ht 64.0 in | Wt 154.0 lb

## 2012-10-22 DIAGNOSIS — I10 Essential (primary) hypertension: Secondary | ICD-10-CM

## 2012-10-22 DIAGNOSIS — M79606 Pain in leg, unspecified: Secondary | ICD-10-CM

## 2012-10-22 DIAGNOSIS — L309 Dermatitis, unspecified: Secondary | ICD-10-CM

## 2012-10-22 DIAGNOSIS — M79609 Pain in unspecified limb: Secondary | ICD-10-CM

## 2012-10-22 DIAGNOSIS — L259 Unspecified contact dermatitis, unspecified cause: Secondary | ICD-10-CM

## 2012-10-22 MED ORDER — ERYTHROMYCIN 5 MG/GM OP OINT: TOPICAL_OINTMENT | Freq: Four times a day (QID) | OPHTHALMIC | Status: DC

## 2012-10-22 MED ORDER — TRIAMCINOLONE ACETONIDE 0.5 % EX OINT: TOPICAL_OINTMENT | Freq: Two times a day (BID) | CUTANEOUS | Status: DC

## 2012-10-22 MED ORDER — NEBIVOLOL HCL 10 MG PO TABS: 10.0000 mg | ORAL_TABLET | Freq: Every day | ORAL | Status: DC

## 2012-10-22 NOTE — Telephone Encounter (Signed)
Patient states she can not afford Bystolic and will continue with Atenolol for now

## 2012-10-22 NOTE — Patient Instructions (Signed)
Bacterial Conjunctivitis  Bacterial conjunctivitis (pink eye) is caused by germs. These germs are spread from person to person (contagious). The white part of the eye may look red or pink. The eye may be irritated, watery, or have a thick discharge.   HOME CARE    To ease pain, apply a cool, clean washcloth over closed eyelids. Do this for 10 to 20 minutes, 3 to 4 times a day.   Gently wipe away any fluid coming from the eye with a warm, wet washcloth or cotton ball.   Wash your hands often with soap and water. Use paper towels to dry your hands.   Do not share towels or washcloths.   Change or wash your pillowcase every day.   Do not use eye makeup until the infection is gone.   Do not use machines or drive if your vision is blurry.   Stop using contact lenses. Do not use them again until your doctor says it is okay.   Do not touch the tip of the eye drop bottle or medicine tube with your fingers when you put medicine on the eye.   Use eye drops or medicated cream as told by your doctor.  GET HELP RIGHT AWAY IF:    Your eye is not better after 3 days of starting your medicine.   You have a yellowish fluid coming out of the eye.   You have more pain in the eye.   Your eye redness is spreading.   Your vision becomes blurry.   You have a temperature by mouth above 102 F (38.9 C), or as told by your doctor.   You have pain in the face, redness, or puffiness (swelling) around the eye.   You have problems with medicines you were given.  MAKE SURE YOU:    Understand these instructions.   Will watch this condition.   Will get help right away if you are not doing well or get worse.  Document Released: 08/30/2008 Document Revised: 05/22/2012 Document Reviewed: 08/30/2008  [employer] Patient Information 2013 [employer].

## 2012-10-22 NOTE — Telephone Encounter (Signed)
Then I want her to see cardiology for their recommendation on blood pressure control. Does she want me to try to see ifwe can get her insurance co to do a tier exemption?

## 2012-10-22 NOTE — Progress Notes (Signed)
  Subjective:    Patient ID: Tina Medina, female    DOB: [birth year], 71 y.o.   MRN: [id number]  HPI Tina Medina shooting pain in her lower legs along the shin that causes her legs to jump. Had to take 3 different pain medication.  She says this happens about 3 times per year. In the past until she has sciatica. But the distribution of her pain is mostly over the anterior tibia. It does not go up her foot.  Right eye feels swollen.  Mild itching. No vision changes.  She says has been crusting in the AM. Present x 3 days. No fever or actual drainage. No blurry vision. [name] not recontact or wear makeup.  HTN - says tolerated the bystolic well at 5mg .  No CP or OSB.  We were trying this instead of the atenolol.   Review of Systems     Objective:   Physical Exam  Constitutional: She is oriented to person, place, and time. She appears well-developed and well-nourished.  HENT:  Head: Normocephalic and atraumatic.  Eyes: Conjunctivae normal are normal. Pupils are equal, round, and reactive to light.       Mild edema over the lower lid of right eye.  Riesa Pope is injected. No active drainage or pus or crusting.  Cardiovascular: Normal rate, regular rhythm and normal heart sounds.   Pulmonary/Chest: Effort normal and breath sounds normal.  Musculoskeletal:       Knee and ankle with normal range of motion left leg. Nontender over the shin today.  Neurological: She is alert and oriented to person, place, and time.  Skin: Skin is warm and dry.  Psychiatric: She has a normal mood and affect. Her behavior is normal.          Assessment & Plan:  HTN - uncontrolled today. Though some of this could be from the Aleve she took last night. We will increase [name] to 10 mg. Sent to her pharmacy. This will likely trigger prior authorization. Depending on the final cost we might be able to send a letter to request a tier extension.  Leg pain - unclear etiology. Her pain is not in the distribution of the  sciatic nerve. She also denies having any recent back pain. Him a sounds like a mononeuropathy. She denies any prior history of shingles in this location. She says that what happens is so tender she can barely touch it. He usually goes away after a day or 2. She does get some relief with NSAIDs happens. I did offer her a stronger pain medication to use as needed but she declined today.  Conjunctivitis-unclear PERRLA bacterial at this point. [name] had symptoms for 3 days. Will go ahead and treat with erythromycin ophthalmic ointment. Call if not significantly better in 4-5 days.

## 2012-10-23 ENCOUNTER — Encounter: Payer: Self-pay | Admitting: Family Medicine

## 2012-10-23 ENCOUNTER — Telehealth: Payer: Self-pay | Admitting: *Deleted

## 2012-10-23 NOTE — Telephone Encounter (Signed)
Patient prefers to call insurance for tier change

## 2012-10-24 ENCOUNTER — Telehealth: Payer: Self-pay | Admitting: *Deleted

## 2012-10-24 NOTE — Telephone Encounter (Signed)
Faxed letter to [facility] for teir change

## 2012-11-19 ENCOUNTER — Ambulatory Visit (INDEPENDENT_AMBULATORY_CARE_PROVIDER_SITE_OTHER): Payer: Medicare Other | Admitting: Physician Assistant

## 2012-11-19 ENCOUNTER — Encounter: Payer: Self-pay | Admitting: Physician Assistant

## 2012-11-19 VITALS — BP 151/75 | HR 58 | Resp 17 | Wt 153.0 lb

## 2012-11-19 DIAGNOSIS — I10 Essential (primary) hypertension: Secondary | ICD-10-CM

## 2012-11-19 NOTE — Patient Instructions (Addendum)
Try taking Byostolic twice a day 5mg  each time. Call in 2 weeks and let Dr. Linford Arnold know if can tolerate. Follow up in 1 month.

## 2012-11-19 NOTE — Progress Notes (Signed)
  Subjective:    Patient ID: Tina Medina, female    DOB: 24-Sep-1941, 71 y.o.   MRN: [id number]  HPI  Patient present to the clinic to follow up on blood pressure.  She has a long history of intolerances to BP medications. Pt has worked closely with Dr. Linford Arnold to try to control BP and not have symptoms. She was able to tolerate Bystolic 5mg  daily but her BP was still uncontrolled. Dr. Judie Petit increased to 10mg  of Bystolic. She then began to have chest pressure and right away got a headache. She has since decreased back to 5mg  tab.  After going down to 5mg  denies any CP, palpations, or headaches.    Review of Systems     Objective:   Physical Exam  Constitutional: She is oriented to person, place, and time. She appears well-developed and well-nourished.  HENT:  Head: Normocephalic and atraumatic.  Cardiovascular: Normal rate, regular rhythm and normal heart sounds.   Pulmonary/Chest: Effort normal and breath sounds normal.  Neurological: She is alert and oriented to person, place, and time.  Skin: Skin is warm and dry.  Psychiatric: She has a normal mood and affect. Her behavior is normal.          Assessment & Plan:  HTN- discussed with the patient that maybe taking twice a day will not cause adverse effects and get BP down. BP still elevated today. Pt will try and monitor if BP not below 140/90 will call Dr. Linford Arnold. If no symptoms and BP controlled come in for follow up and BP recheck in 1 month. I discussed with patient about hydralizine. She does not remember trying. If not able to tolerate Dr. Linford Arnold might try something like that in combination with Bystolic 5mg .

## 2012-12-03 ENCOUNTER — Telehealth: Payer: Self-pay | Admitting: *Deleted

## 2012-12-03 NOTE — Telephone Encounter (Signed)
BP sounds much better which is great!  It shouldn't be causing memory issues. I don't think this is related. I am happy with the  Pressures though.  The gas would be unusual as well.  Has she been eating a little differently than usual over the HOlidays?

## 2012-12-03 NOTE — Telephone Encounter (Signed)
Pt called to let Dr. Linford Arnold know that her BP is 143/71. (this message was left at 2:53) she also states the medication seems to be giving her gas and causing forgetfullness

## 2012-12-04 NOTE — Telephone Encounter (Signed)
The medication takes 30- 60 min to start absorbing so it is definitely not causing her memory loss.

## 2012-12-04 NOTE — Telephone Encounter (Signed)
Pt notified. Pt states she has not changed anything any differently. Pt states about 5 min after she takes the med she cant remember anything. Pt states she will give it a few more weeks and call us back if it keeps happening

## 2013-01-16 ENCOUNTER — Encounter: Payer: Medicare Other | Admitting: Physician Assistant

## 2013-01-23 ENCOUNTER — Ambulatory Visit (INDEPENDENT_AMBULATORY_CARE_PROVIDER_SITE_OTHER): Payer: Medicare Other | Admitting: Family Medicine

## 2013-01-23 ENCOUNTER — Encounter: Payer: Self-pay | Admitting: Family Medicine

## 2013-01-23 VITALS — BP 164/78 | HR 64 | Resp 18 | Ht 63.5 in | Wt 155.0 lb

## 2013-01-23 DIAGNOSIS — E785 Hyperlipidemia, unspecified: Secondary | ICD-10-CM

## 2013-01-23 LAB — COMPLETE METABOLIC PANEL WITH GFR
ALT: 15 U/L (ref 0–35)
Alkaline Phosphatase: 77 U/L (ref 39–117)
CO2: 28 mEq/L (ref 19–32)
Creat: 0.82 mg/dL (ref 0.50–1.10)
GFR, Est African American: 82 mL/min
GFR, Est Non African American: 71 mL/min
Glucose, Bld: 89 mg/dL (ref 70–99)
Sodium: 141 mEq/L (ref 135–145)
Total Bilirubin: 0.6 mg/dL (ref 0.3–1.2)
Total Protein: 6.9 g/dL (ref 6.0–8.3)

## 2013-01-23 LAB — LIPID PANEL
HDL: 49 mg/dL (ref >39)
LDL Cholesterol: 126 mg/dL — ABNORMAL HIGH (ref 0–99)
Total CHOL/HDL Ratio: 3.9 Ratio
Triglycerides: 75 mg/dL (ref <150)

## 2013-01-23 MED ORDER — NEBIVOLOL HCL 10 MG PO TABS: 10.0000 mg | ORAL_TABLET | Freq: Every day | ORAL | Status: DC

## 2013-01-23 MED ORDER — AMBULATORY NON FORMULARY MEDICATION: Status: DC

## 2013-01-23 NOTE — Progress Notes (Signed)
Subjective:    Tina Medina is a 72 y.o. female who presents for Medicare Annual/Subsequent preventive examination.  Preventive Screening-Counseling & Management  Tobacco History  Smoking status  . Former Smoker  . Types: Cigarettes  Smokeless tobacco  . Former Neurosurgeon  . Quit date: 12/05/1962     Problems Prior to Visit 1. she has had about 3 episodes of chest pain. It starts in the left chest and radiates up the left shoulder. Only last seconds. She denies any diaphoresis or shortness of breath with this. She think she was washing doses or being active when it occurred. She says her last stress test was a couple of years ago. She thinks it's because of the Bystolic but other day she takes Bystolic and has no symptoms whatsoever.  Current Problems (verified) Patient Active Problem List  Diagnosis  . MENINGIOMA  . GOITER, MULTINODULAR  . HYPERLIPIDEMIA  . BENIGN POSITIONAL VERTIGO  . ESSENTIAL HYPERTENSION, BENIGN  . ALLERGIC RHINITIS  . GERD  . POSTMENOPAUSAL STATUS  . DERMATITIS, ATOPIC  . SPINAL STENOSIS, LUMBAR  . NIGHT SWEATS  . CAROTID BRUIT, LEFT  . PERSONAL HISTORY OF COLONIC POLYPS  . Neck pain, chronic  . Depression  . Meningioma  . Acute bronchitis    Medications Prior to Visit Current Outpatient Prescriptions on File Prior to Visit  Medication Sig Dispense Refill  . aspirin 81 MG chewable tablet Chew 81 mg by mouth daily.        . hydrochlorothiazide (HYDRODIURIL) 25 MG tablet Take 1 tablet (25 mg total) by mouth daily.  90 tablet  1  . nebivolol (BYSTOLIC) 10 MG tablet Take 1 tablet (10 mg total) by mouth daily.  30 tablet  11  . rosuvastatin (CRESTOR) 10 MG tablet Take 1 tablet (10 mg total) by mouth daily. 1/2 tab by mouth every other night  90 tablet  1  . triamcinolone ointment (KENALOG) 0.5 % Apply topically 2 (two) times daily.  30 g  0  . [DISCONTINUED] telmisartan (MICARDIS) 20 MG tablet Take 1 tablet (20 mg total) by mouth daily.  14 tablet  0    No current facility-administered medications on file prior to visit.    Current Medications (verified) Current Outpatient Prescriptions  Medication Sig Dispense Refill  . aspirin 81 MG chewable tablet Chew 81 mg by mouth daily.        . hydrochlorothiazide (HYDRODIURIL) 25 MG tablet Take 1 tablet (25 mg total) by mouth daily.  90 tablet  1  . nebivolol (BYSTOLIC) 10 MG tablet Take 1 tablet (10 mg total) by mouth daily.  30 tablet  11  . rosuvastatin (CRESTOR) 10 MG tablet Take 1 tablet (10 mg total) by mouth daily. 1/2 tab by mouth every other night  90 tablet  1  . triamcinolone ointment (KENALOG) 0.5 % Apply topically 2 (two) times daily.  30 g  0  . AMBULATORY NON FORMULARY MEDICATION Medication Name: Zostavax IM x 1  1 vial  0  . [DISCONTINUED] telmisartan (MICARDIS) 20 MG tablet Take 1 tablet (20 mg total) by mouth daily.  14 tablet  0   No current facility-administered medications for this visit.     Allergies (verified) Ace inhibitors; Amlodipine; Benicar; Diltiazem; Losartan; Metoprolol; Micardis; and Penicillins   PAST HISTORY  Family History Family History  Problem Relation Age of Onset  . Hyperlipidemia Mother   . Hypertension Mother   . Diabetes Mother   . Prostate cancer Father   .  Hypertension Brother     Social History History  Substance Use Topics  . Smoking status: Former Smoker    Types: Cigarettes  . Smokeless tobacco: Former Neurosurgeon    Quit date: 12/05/1962  . Alcohol Use: Yes     Comment: Occasional glass of wine     Are there smokers in your home (other than you)? No  Risk Factors Current exercise habits: walks occassionally  Dietary issues discussed: none   Cardiac risk factors: advanced age (older than 69 for men, 69 for women) and hypertension.  Depression Screen (Note: if answer to either of the following is "Yes", a more complete depression screening is indicated)   Over the past two weeks, have you felt down, depressed or hopeless?  No  Over the past two weeks, have you felt little interest or pleasure in doing things? No  Have you lost interest or pleasure in daily life? No  Do you often feel hopeless? No  Do you cry easily over simple problems? No  Activities of Daily Living In your present state of health, do you have any difficulty performing the following activities?:  Driving? No Managing money?  No Feeding yourself? No Getting from bed to chair? No Climbing a flight of stairs? No Preparing food and eating?: No Bathing or showering? No Getting dressed: No Getting to the toilet? No Using the toilet:No Moving around from place to place: No In the past year have you fallen or had a near fall?:No   Are you sexually active?  Yes  Do you have more than one partner?  No  Hearing Difficulties: No Do you often ask people to speak up or repeat themselves? No Do you experience ringing or noises in your ears? No Do you have difficulty understanding soft or whispered voices? No   Do you feel that you have a problem with memory? No  Do you often misplace items? No  Do you feel safe at home?  Yes  Cognitive Testing  Alert? Yes  Normal Appearance?Yes  Oriented to person? Yes  Place? Yes   Time? Yes  Recall of three objects?  Yes  Can perform simple calculations? Yes  Displays appropriate judgment?Yes  Can read the correct time from a watch face?Yes   Advanced Directives have been discussed with the patient? Yes  List the Names of Other Physician/Practitioners you currently use: 1.    Indicate any recent Medical Services you may have received from other than [facility] providers in the past year (date may be approximate).  Immunization History  Administered Date(s) Administered  . Influenza Split 08/20/2012  . Influenza Whole 10/16/2009, 08/12/2011  . Pneumococcal Polysaccharide 02/26/2008  . Td 12/05/2004    Screening Tests Health Maintenance  Topic Date Due  . Zostavax  01/10/1999  . Influenza  Vaccine  08/05/2013  . Tetanus/tdap  12/05/2014  . Colonoscopy  12/06/2014  . Pneumococcal Polysaccharide Vaccine Age 41 And Over  Completed    All answers were reviewed with the patient and necessary referrals were made:  METHENEY,CATHERINE, MD   01/23/2013   History reviewed: allergies, current medications, past family history, past medical history, past social history, past surgical history and problem list  Review of Systems A comprehensive review of systems was negative.    Objective:     Vision by Snellen chart: right eye:20/25, left eye:20/30  Body mass index is 27.02 kg/(m^2). BP 156/82  Pulse 64  Resp 18  Ht 5' 3.5" (1.613 m)  Wt 155 lb (70.308  kg)  BMI 27.02 kg/m2  SpO2 97%  BP 156/82  Pulse 64  Resp 18  Ht 5' 3.5" (1.613 m)  Wt 155 lb (70.308 kg)  BMI 27.02 kg/m2  SpO2 97%  General Appearance:    Alert, cooperative, no distress, appears stated age  Head:    Normocephalic, without obvious abnormality, atraumatic  Eyes:    PERRL, conjunctiva/corneas clear, EOM's intact, both eyes  Ears:    Normal TM's and external ear canals, both ears  Nose:   Nares normal, septum midline, mucosa normal, no drainage    or sinus tenderness  Throat:   Lips, mucosa, and tongue normal; teeth and gums normal  Neck:   Supple, symmetrical, trachea midline, no adenopathy;    thyroid:  no enlargement/tenderness/nodules; no carotid   bruit or JVD  Back:     Symmetric, no curvature, ROM normal, no CVA tenderness  Lungs:     Clear to auscultation bilaterally, respirations unlabored  Chest Wall:    No tenderness or deformity   Heart:    Regular rate and rhythm, S1 and S2 normal, no murmur, rub   or gallop  Breast Exam:    No tenderness, masses, or nipple abnormality  Abdomen:     Soft, non-tender, bowel sounds active all four quadrants,    no masses, no organomegaly  Genitalia:    Not peformed.   Rectal:    Not performed.   Extremities:   Extremities normal, atraumatic, no cyanosis  or edema  Pulses:   2+ and symmetric all extremities  Skin:   Skin color, texture, turgor normal, no rashes or lesions  Lymph nodes:   Cervical, supraclavicular, and axillary nodes normal  Neurologic:   CNII-XII intact, normal strength, sensation and reflexes    throughout       Assessment:     Annual Wellness Exam      Plan:     During the course of the visit the patient was educated and counseled about appropriate screening and preventive services including:    shingles vaccine  Discussed getting advanced directives.  Atypical chest pain-will refer for treadmill stress test, since she does have a history of hyperlipidemia, hypertension and blood pressure is uncontrolled which certainly puts her at risk.  I really don't think this is a side effect of the Bystolic. Refill her medication.  Hypertension-not completely at goal but she has tried semi-medications that I feel like if we can continue at least when she's on that she is getting some benefit. I will refill [name] today. Followup in 4-6 weeks.  Diet review for nutrition referral? Yes ____  Not Indicated x   Patient Instructions (the written plan) was given to the patient.  Medicare Attestation I have personally reviewed: The patient's medical and social history Their use of alcohol, tobacco or illicit drugs Their current medications and supplements The patient's functional ability including ADLs,fall risks, home safety risks, cognitive, and hearing and visual impairment Diet and physical activities Evidence for depression or mood disorders  The patient's weight, height, BMI, and visual acuity have been recorded in the chart.  I have made referrals, counseling, and provided education to the patient based on review of the above and I have provided the patient with a written personalized care plan for preventive services.     METHENEY,CATHERINE, MD   01/23/2013

## 2013-01-23 NOTE — Patient Instructions (Addendum)
Keep up a regular exercise program and make sure you are eating a healthy diet Try to eat 4 servings of dairy a day, or if you are lactose intolerant take a calcium with vitamin D daily.  Your vaccines are up to date.  Try to get  Your shingles vaccine.

## 2013-01-24 ENCOUNTER — Encounter: Payer: Self-pay | Admitting: *Deleted

## 2013-02-05 ENCOUNTER — Ambulatory Visit: Payer: Medicare Other | Admitting: Family Medicine

## 2013-02-20 ENCOUNTER — Telehealth: Payer: Self-pay | Admitting: Family Medicine

## 2013-02-20 ENCOUNTER — Encounter: Payer: Self-pay | Admitting: Family Medicine

## 2013-02-20 ENCOUNTER — Telehealth: Payer: Self-pay | Admitting: *Deleted

## 2013-02-20 ENCOUNTER — Ambulatory Visit (INDEPENDENT_AMBULATORY_CARE_PROVIDER_SITE_OTHER): Payer: Medicare Other | Admitting: Family Medicine

## 2013-02-20 VITALS — BP 140/70 | HR 56 | Ht 63.0 in | Wt 154.0 lb

## 2013-02-20 DIAGNOSIS — I10 Essential (primary) hypertension: Secondary | ICD-10-CM

## 2013-02-20 NOTE — Telephone Encounter (Signed)
Called pt did not get an answer lmovm informing her that an appt has been  made for her to have hr ABI's done for peripheral vascular disease, @ [id number] [place].  Monday, 02/25/2013 @ 2pm pt to arrive at 145 pm. Pt will need to go to the [employer] ([facility]). They can valet park her car for her free of charge. Should she NOT be able to KEEP this appt SHE will need to call their office @ [phone] to either cancel/reschedule.   Cindy in [place] lab asked that I checked to see if this would need to be pre cert and the Cpt code is:93922. I called [employer] [phone] and spoke with Vincenza Hews T. And he informed me that this did not require prior auth. Called Cindy back [phone] and informed her that prior Berkley Harvey was not needed and she would document this in pt's chart.Laureen Ochs, Viann Shove

## 2013-02-20 NOTE — Progress Notes (Signed)
  Subjective:    Patient ID: Tina Medina, female    DOB: [birth year], 72 y.o.   MRN: [id number]  HPI HTN-4 wk follow up. she would like a refill on the bystolic however it is too expensive. she said that someone was going to look into it to see if it was on a different tier.; pt stated that she received a letter about being checked for PAD. Does feel like extremities are more cold than usually on the Bystolic compared to her other medications.. chest pain is much better, Seems to have resolved. She complained initially that she was having chest pain after taking the Bystolic. Says wants to hold off the stress test for now. He says her stomach gets a little upset and gurgles if she takes the 10 mg dose of the Bystolic instead of 5 mg. She did take a 10 mg tablet today just to the we can see what the blood pressure was doing on that dose.   Review of Systems     Objective:   Physical Exam  Constitutional: She is oriented to person, place, and time. She appears well-developed and well-nourished.  HENT:  Head: Normocephalic and atraumatic.  Cardiovascular: Normal rate, regular rhythm and normal heart sounds.   Pulmonary/Chest: Effort normal and breath sounds normal.  Neurological: She is alert and oriented to person, place, and time.  Skin: Skin is warm and dry.  Psychiatric: She has a normal mood and affect. Her behavior is normal.          Assessment & Plan:  HTN -Cut the bystolic in half and take BID to avoid GI upset. Call the inxurance to request lower tier copay on bystolic. They said that because it's a tear 3 drug that they do not allow exceptions for change into your co-pay. Thus I tried to give her samples today and hopefully this will help. At this point she's tried so many medications I really think this is going to be the best fit for her. [name] mildly bradycardic running in the 50s but she is asymptomatic with it and we'll continue to monitor. Followup in 4 months  Atypical CP  - resolved.  She wants to hold off on stress testing. I will check into why she was never contacted about the original referral several weeks ago. I did strongly encourage her to call me for chest pain recurs then I would like to get her in immediately for stress testing.  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 But at this point I think this is been the best drug for her. Samples given today.   Will schedule ABIs to evaluate for peripheral vascular disease.

## 2013-02-20 NOTE — Telephone Encounter (Signed)
Since insurance recommend screening for peripheral vascular disease we will schedule her for ABIs which are basically specialist blood pressures of the legs to screen for any abnormality in her peripheral blood flow.

## 2013-02-20 NOTE — Telephone Encounter (Signed)
Spoke with [employer] rep in regards to pt getting a lower tier/copay option and was told that due to this being a tier 3 it is not eligible. She stated that anything on a tier 1,3 or 5 is ineligible for a lower tier/copay opt. I asked if this can be med reviewed and she said it could not. Pt informed prior to leaving ofc.Loralee Pacas [place]

## 2013-02-22 NOTE — Telephone Encounter (Signed)
Pt called pt to ask if this was a test that Dr. Linford Arnold thought she should have. I told her she informed us that this was something that her insurance co. Recommended that she have done. Pt reminded that should she not be able to keep her appt she will need to call and cancel/reschedule the appt. Pt voiced understanding and agreed.Tina Medina

## 2013-02-25 ENCOUNTER — Encounter (HOSPITAL_COMMUNITY): Payer: Medicare Other

## 2013-03-28 ENCOUNTER — Telehealth: Payer: Self-pay | Admitting: *Deleted

## 2013-03-28 ENCOUNTER — Ambulatory Visit (HOSPITAL_COMMUNITY): Payer: Medicare Other

## 2013-03-28 NOTE — Telephone Encounter (Signed)
Called and informed pt's husband that her appt has been cancelled.Loralee Pacas [place]

## 2013-04-03 ENCOUNTER — Encounter: Payer: Self-pay | Admitting: Physician Assistant

## 2013-04-03 ENCOUNTER — Telehealth: Payer: Self-pay | Admitting: Physician Assistant

## 2013-04-03 ENCOUNTER — Ambulatory Visit (INDEPENDENT_AMBULATORY_CARE_PROVIDER_SITE_OTHER): Payer: Medicare Other | Admitting: Physician Assistant

## 2013-04-03 ENCOUNTER — Ambulatory Visit (INDEPENDENT_AMBULATORY_CARE_PROVIDER_SITE_OTHER): Payer: Medicare Other

## 2013-04-03 VITALS — BP 138/77 | HR 58 | Wt 153.0 lb

## 2013-04-03 DIAGNOSIS — M62838 Other muscle spasm: Secondary | ICD-10-CM

## 2013-04-03 DIAGNOSIS — D239 Other benign neoplasm of skin, unspecified: Secondary | ICD-10-CM

## 2013-04-03 DIAGNOSIS — R1031 Right lower quadrant pain: Secondary | ICD-10-CM

## 2013-04-03 DIAGNOSIS — D229 Melanocytic nevi, unspecified: Secondary | ICD-10-CM

## 2013-04-03 NOTE — Telephone Encounter (Signed)
Please call pt: lines on finger are considered a normal variant. Call if any new changes.

## 2013-04-03 NOTE — Patient Instructions (Addendum)
Try magnesium 500mg  daily. Will get abdominal xray to look for contstipation. Miralax 1-2 capfuls a day consider a stool softener like colace. Bath in [employer] salt. Heat area. ibprofen up to 600mg  three times a day.   I will check on nail discoloration and see if any vitamin deficiency.

## 2013-04-03 NOTE — Telephone Encounter (Signed)
Pt.notified

## 2013-04-03 NOTE — Progress Notes (Signed)
  Subjective:    Patient ID: Tina Medina, female    DOB: [birth year], 72 y.o.   MRN: [id number]  HPI Patient presents to the clinic for RLQ pain and muscle spasms. She has had this before and saw Dr. Eppie Gibson. She was supposed to get imaging at one point but she declined and it went away. It is now back and pain is not constant but off and on. No radiation of pain down leg. No pain with urination or any problems with frequency or urgency. Denies any blood in urine or stool. She is more constipated lately b/c taking pain meds for dental work. She also has been lifting a lot of heavy boxes. She has not felt a pop or pull. She still has ovaries and appendix.   Concerned about black strip down thumb and index finger on right hand. No pain. No issues. She just wonders about it. Been present for many years. Seems to be getting a little darker.    Review of Systems     Objective:   Physical Exam  Constitutional: She appears well-developed and well-nourished.  HENT:  Head: Normocephalic and atraumatic.  Cardiovascular: Normal rate, regular rhythm and normal heart sounds.   Pulmonary/Chest: Effort normal and breath sounds normal.  Abdominal: Soft. Bowel sounds are normal.  Mild tenderness over the RLQ. No signs of hernia. No guarding.   Skin: Skin is warm and dry.  longitudial dark lines on right hand thumb and index finger.   Psychiatric: She has a normal mood and affect. Her behavior is normal.          Assessment & Plan:  RLQ pain/muscle spasms- I suggested ultrasound to evaluate ovaries and then if normal get CT. Pt does not want imaging right now. I suspect bowel causes issues and maybe pain medication making worse. Start miralax 1-2 capfuls daily drinking lots of water along with colace. Will get abdominal xray today and confirm stool in abdomen. Follow up if not improving and lets get imaging. Encouraged patient to try magnesium 500mg /heat/epson salt baths if could be some muscular  tightness/ibuprofen.  Junctional nevomelanocytic nevus of nail matrix- Reassured pt of being normal.

## 2013-06-24 ENCOUNTER — Ambulatory Visit (INDEPENDENT_AMBULATORY_CARE_PROVIDER_SITE_OTHER): Payer: Medicare Other | Admitting: Family Medicine

## 2013-06-24 ENCOUNTER — Encounter: Payer: Self-pay | Admitting: Family Medicine

## 2013-06-24 VITALS — BP 132/86 | HR 58 | Ht 64.0 in | Wt 149.0 lb

## 2013-06-24 DIAGNOSIS — L259 Unspecified contact dermatitis, unspecified cause: Secondary | ICD-10-CM

## 2013-06-24 DIAGNOSIS — S161XXS Strain of muscle, fascia and tendon at neck level, sequela: Secondary | ICD-10-CM

## 2013-06-24 DIAGNOSIS — L309 Dermatitis, unspecified: Secondary | ICD-10-CM

## 2013-06-24 DIAGNOSIS — L819 Disorder of pigmentation, unspecified: Secondary | ICD-10-CM

## 2013-06-24 DIAGNOSIS — I10 Essential (primary) hypertension: Secondary | ICD-10-CM

## 2013-06-24 LAB — BASIC METABOLIC PANEL WITH GFR
CO2: 30 mEq/L (ref 19–32)
Calcium: 10.3 mg/dL (ref 8.4–10.5)
Creat: 0.92 mg/dL (ref 0.50–1.10)
GFR, Est African American: 71 mL/min
Glucose, Bld: 88 mg/dL (ref 70–99)

## 2013-06-24 MED ORDER — NEBIVOLOL HCL 10 MG PO TABS: 10.0000 mg | ORAL_TABLET | Freq: Every day | ORAL | Status: DC

## 2013-06-24 MED ORDER — TRIAMCINOLONE ACETONIDE 0.5 % EX OINT: TOPICAL_OINTMENT | Freq: Two times a day (BID) | CUTANEOUS | Status: DC

## 2013-06-24 NOTE — Progress Notes (Signed)
Subjective:    Patient ID: Tina Medina, female    DOB: August 26, 1941, 72 y.o.   MRN: [id number]  HPI HTN -  Pt denies chest pain, SOB, dizziness, or heart palpitations.  Taking meds as directed w/o problems.  Denies medication side effects.    Wants to discusse xray finding done at [facility] ED in June from her neck. She went for neck pain.  She says the muscle relaxer really helped. She is not longer taking meds and she is feeing much better.    Hyperpigmentation - from bug bites on her on her arms or legs.   Review of Systems     BP 132/86  Pulse 58  Ht 5\' 4"  (1.626 m)  Wt 149 lb (67.586 kg)  BMI 25.56 kg/m2    Allergies  Allergen Reactions  . Ace Inhibitors     REACTION: cough  . Amlodipine   . Benicar (Olmesartan Medoxomil) Other (See Comments)    Chest pain  . Diltiazem Other (See Comments)    Sharp head ache.   . Losartan Other (See Comments)    headache  . Metoprolol Diarrhea    Abdominal cramping.   . Micardis (Telmisartan) Other (See Comments)    Chest Pain, Back Pain.   [name] Penicillins     REACTION: rash    Past Medical History  Diagnosis Date  . 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 Osteoma     2 mm of the L lid ethmoid air cells  . Hypertension   . Menopause     since age 57  . DDD (degenerative disc disease), lumbar     L spine, spinal stenosis  . Migraine   . Gastritis     H Pylori  . Hyperlipidemia     Past Surgical History  Procedure Laterality Date  . Breast surgery  1969    Left    History   Social History  . Marital Status: Married    Spouse Name: N/A    Number of Children: 3  . Years of Education: N/A   Occupational History  . Retired.       Retired   Social History Main Topics  . Smoking status: Former Smoker    Types: Cigarettes  . Smokeless tobacco: Former Neurosurgeon    Quit date: 12/05/1962  . Alcohol Use: Yes     Comment: Occasional glass of wine   . Drug Use: No  . Sexually Active: Not on file   Other Topics Concern  . Not on file   Social History Narrative   Some exercise.     Family History  Problem Relation Age of Onset  . Hyperlipidemia Mother   . Hypertension Mother   . Diabetes Mother   . Prostate cancer Father   . Hypertension Brother     Outpatient Encounter Prescriptions as of 06/24/2013  Medication Sig Dispense Refill  . AMBULATORY NON FORMULARY MEDICATION Medication Name: Zostavax IM x 1  1 vial  0  . aspirin 81 MG chewable tablet Chew 81 mg by mouth daily.        . hydrochlorothiazide (HYDRODIURIL) 25 MG tablet Take 1 tablet (25 mg total) by mouth daily.  90 tablet  1  . nebivolol (BYSTOLIC) 10 MG tablet Take 1 tablet (10 mg total) by mouth daily.  30 tablet  6  . rosuvastatin (CRESTOR) 10 MG tablet  Take 1 tablet (10 mg total) by mouth daily. 1/2 tab by mouth every other night  90 tablet  1  . triamcinolone ointment (KENALOG) 0.5 % Apply topically 2 (two) times daily.  30 g  1  . [DISCONTINUED] nebivolol (BYSTOLIC) 10 MG tablet Take 1 tablet (10 mg total) by mouth daily.  30 tablet  6  . [DISCONTINUED] triamcinolone ointment (KENALOG) 0.5 % Apply topically 2 (two) times daily.  30 g  0  . cyclobenzaprine (FLEXERIL) 5 MG tablet Take 5 mg by mouth at bedtime as needed for muscle spasms.       No facility-administered encounter medications on file as of 06/24/2013.       Objective:   Physical Exam  Constitutional: She is oriented to person, place, and time. She appears well-developed and well-nourished.  HENT:  Head: Normocephalic and atraumatic.  Cardiovascular: Normal rate, regular rhythm and normal heart sounds.   Pulmonary/Chest: Effort normal and breath sounds normal.  Musculoskeletal: She exhibits no edema.  Neurological: She is alert and oriented to person, place, and time.  Skin: Skin is warm and dry.  Hyperpigmentation macules on her forearms and around her elbows.   Psychiatric: She has a  normal mood and affect. Her behavior is normal.          Assessment & Plan:  HTN - well controlled.  Needs refills today. F/u in 6 months.   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 She also has some slippage of her discs. Right now her neck is doing okay and she's not having any significant pain. [name] not currently taking any medications for it. I did add the Flexeril to her med list in case she needs refills in the future since it was very helpful for her. I discussed with her that if her pain becomes more persistent then I would like her to see my partner Dr. Benjamin Stain for further evaluation and treatment. We also discussed exercises that she can start doing with range of motion as well as against resistance to help strengthen her neck muscles.  Hyperpigmentation-we discussed that she can put topical steroid cream on but bites to reduce inflammation and swelling and irritation initially. Also avoid scratching. This should help prevent hyperpigmentation.

## 2013-06-24 NOTE — Progress Notes (Signed)
Quick Note:  All labs are normal. ______ 

## 2013-08-13 ENCOUNTER — Other Ambulatory Visit: Payer: Self-pay | Admitting: Family Medicine

## 2013-08-13 DIAGNOSIS — Z1231 Encounter for screening mammogram for malignant neoplasm of breast: Secondary | ICD-10-CM

## 2013-08-15 ENCOUNTER — Ambulatory Visit (INDEPENDENT_AMBULATORY_CARE_PROVIDER_SITE_OTHER): Payer: Medicare Other

## 2013-08-15 DIAGNOSIS — Z1231 Encounter for screening mammogram for malignant neoplasm of breast: Secondary | ICD-10-CM

## 2013-09-30 ENCOUNTER — Ambulatory Visit (INDEPENDENT_AMBULATORY_CARE_PROVIDER_SITE_OTHER): Payer: Medicare Other | Admitting: Family Medicine

## 2013-09-30 DIAGNOSIS — Z23 Encounter for immunization: Secondary | ICD-10-CM

## 2013-09-30 NOTE — Progress Notes (Signed)
  Subjective:    Patient ID: Tina Medina, female    DOB: August 23, 1941, 72 y.o.   MRN: [id number]  HPI  Patient is here today for a flu vaccine. She tolerated it well.   Review of Systems     Objective:   Physical Exam        Assessment & Plan:

## 2013-12-26 ENCOUNTER — Ambulatory Visit (INDEPENDENT_AMBULATORY_CARE_PROVIDER_SITE_OTHER): Payer: Medicare HMO | Admitting: Family Medicine

## 2013-12-26 ENCOUNTER — Encounter: Payer: Self-pay | Admitting: Family Medicine

## 2013-12-26 VITALS — BP 160/80 | HR 56 | Temp 98.6°F | Ht 64.0 in | Wt 152.0 lb

## 2013-12-26 DIAGNOSIS — R0989 Other specified symptoms and signs involving the circulatory and respiratory systems: Secondary | ICD-10-CM

## 2013-12-26 DIAGNOSIS — G245 Blepharospasm: Secondary | ICD-10-CM

## 2013-12-26 DIAGNOSIS — I10 Essential (primary) hypertension: Secondary | ICD-10-CM

## 2013-12-26 LAB — COMPLETE METABOLIC PANEL WITH GFR
ALBUMIN: 4.2 g/dL (ref 3.5–5.2)
ALT: 14 U/L (ref 0–35)
AST: 21 U/L (ref 0–37)
Alkaline Phosphatase: 76 U/L (ref 39–117)
BUN: 10 mg/dL (ref 6–23)
CALCIUM: 9.5 mg/dL (ref 8.4–10.5)
CHLORIDE: 106 meq/L (ref 96–112)
CO2: 30 meq/L (ref 19–32)
CREATININE: 0.9 mg/dL (ref 0.50–1.10)
GFR, EST AFRICAN AMERICAN: 73 mL/min
GFR, Est Non African American: 63 mL/min
Glucose, Bld: 96 mg/dL (ref 70–99)
POTASSIUM: 4.5 meq/L (ref 3.5–5.3)
SODIUM: 143 meq/L (ref 135–145)
TOTAL PROTEIN: 6.9 g/dL (ref 6.0–8.3)
Total Bilirubin: 0.5 mg/dL (ref 0.3–1.2)

## 2013-12-26 LAB — CK: Total CK: 72 U/L (ref 7–177)

## 2013-12-26 LAB — TSH: TSH: 1.047 u[IU]/mL (ref 0.350–4.500)

## 2013-12-26 NOTE — Progress Notes (Signed)
   Subjective:    Patient ID: Tina Medina, female    DOB: [birth year], 73 y.o.   MRN: [id number]  HPI Lower left eyelid twitching x 1 week. Has been constant. Hx of cataracts and astigmatism.  No pain or fever. No change in vision. Last eye appt was in December. No trauma or injury. She says it really doesn't bother her but she notices it in the near. No worsening or alleviating factors. [name] not really ever had this before.  Hypertension- Pt denies chest pain, SOB, dizziness, or heart palpitations.  Taking meds as directed w/o problems.  Denies medication side effects.    She mostly takes her blood pressure medications at night. She said she got behind a truck and could not get over and that got her stressed out and upset on the way here.   Review of Systems     Objective:   Physical Exam  Constitutional: She is oriented to person, place, and time. She appears well-developed and well-nourished.  HENT:  Head: Normocephalic and atraumatic.  Eyes: Conjunctivae and EOM are normal. Pupils are equal, round, and reactive to light. Right eye exhibits no discharge. Left eye exhibits no discharge.  She does have cataracts on both eyes. No inflamed conjunctiva or sclera. She does have visible blepharospasm and fasciculations of the left lower eyelid.  Cardiovascular: Normal rate, regular rhythm and normal heart sounds.   Pulmonary/Chest: Effort normal and breath sounds normal.  Neurological: She is alert and oriented to person, place, and time.  Skin: Skin is warm and dry.  Psychiatric: She has a normal mood and affect. Her behavior is normal.          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 I like check electrolytes today. Her vision was fair. The she actually had better vision in her left eye compared her right eye. She does have a history complicated by astigmatism and cataracts and current lens wear that she feels really  isn't working very well. If everything looks normal and I would like her to get back in with her ophthalmologist for further evaluation and treatment.  Hypertension-uncontrolled. Repeat blood pressure was at least a little better. The last time she was here it looked great. Continue with current regimen and followup in 2 weeks to make sure blood pressures back under control. If not then we may need to adjust [name]. Samples given today.

## 2013-12-27 ENCOUNTER — Encounter: Payer: Medicare Other | Admitting: Family Medicine

## 2013-12-27 ENCOUNTER — Other Ambulatory Visit: Payer: Self-pay | Admitting: *Deleted

## 2013-12-27 DIAGNOSIS — G245 Blepharospasm: Secondary | ICD-10-CM

## 2013-12-27 NOTE — Progress Notes (Signed)
Quick Note:  All labs are normal. ______ 

## 2013-12-31 ENCOUNTER — Encounter: Payer: Medicare Other | Admitting: Family Medicine

## 2014-01-06 ENCOUNTER — Telehealth: Payer: Self-pay | Admitting: *Deleted

## 2014-01-06 NOTE — Telephone Encounter (Signed)
Pt called and wanted to know the status of referral I told her that this was sent on 1/23 and that I would check w/referral and get back w/her.Audelia Hives [place]

## 2014-01-08 ENCOUNTER — Other Ambulatory Visit: Payer: Self-pay | Admitting: *Deleted

## 2014-01-08 DIAGNOSIS — G245 Blepharospasm: Secondary | ICD-10-CM

## 2014-01-09 ENCOUNTER — Telehealth: Payer: Self-pay | Admitting: Family Medicine

## 2014-01-09 NOTE — Telephone Encounter (Signed)
There was a previous referral placed for pt for the same condition on 1/23. I called [facility] to follow up on that referral. I was told by Letta Moynahan that patient was not sure of her insurance,she called back and made an appt then cancelled. I called the patient and left msg about my conversation with Audra.

## 2014-01-13 ENCOUNTER — Ambulatory Visit (INDEPENDENT_AMBULATORY_CARE_PROVIDER_SITE_OTHER): Payer: Medicare HMO | Admitting: Physician Assistant

## 2014-01-13 VITALS — BP 148/82 | HR 54

## 2014-01-13 DIAGNOSIS — I10 Essential (primary) hypertension: Secondary | ICD-10-CM

## 2014-01-13 NOTE — Progress Notes (Signed)
Pt here for BP check. She reports that she has had a close friend to pass over the weekend. Maryruth Eve, [place] Crocker'

## 2014-01-27 ENCOUNTER — Encounter: Payer: Self-pay | Admitting: Family Medicine

## 2014-01-27 ENCOUNTER — Ambulatory Visit (INDEPENDENT_AMBULATORY_CARE_PROVIDER_SITE_OTHER): Payer: Medicare HMO | Admitting: Family Medicine

## 2014-01-27 VITALS — BP 189/82 | HR 59 | Wt 153.0 lb

## 2014-01-27 DIAGNOSIS — Z780 Asymptomatic menopausal state: Secondary | ICD-10-CM

## 2014-01-27 DIAGNOSIS — E785 Hyperlipidemia, unspecified: Secondary | ICD-10-CM

## 2014-01-27 DIAGNOSIS — Z0000 Encounter for general adult medical examination without abnormal findings: Secondary | ICD-10-CM

## 2014-01-27 NOTE — Patient Instructions (Signed)
Keep up a regular exercise program and make sure you are eating a healthy diet Try to eat 4 servings of dairy a day, or if you are lactose intolerant take a calcium with vitamin D daily.  Your vaccines are up to date.   

## 2014-01-27 NOTE — Progress Notes (Signed)
Subjective:    Tina Medina is a 73 y.o. female who presents for Medicare Annual/Subsequent preventive examination.  Preventive Screening-Counseling & Management  Tobacco History  Smoking status  . Former Smoker  . Types: Cigarettes  Smokeless tobacco  . Former Systems developer  . Quit date: 12/05/1962     Problems Prior to Visit 1.   Current Problems (verified) Patient Active Problem List   Diagnosis Date Noted  . Meningioma 08/13/2012  . Depression 01/12/2012  . Neck pain, chronic 04/18/2011  . NIGHT SWEATS 01/17/2011  . CAROTID BRUIT, LEFT 01/17/2011  . PERSONAL HISTORY OF COLONIC POLYPS 01/17/2011  . ALLERGIC RHINITIS 09/03/2010  . GOITER, MULTINODULAR 07/23/2010  . HYPERLIPIDEMIA 05/10/2010  . MENINGIOMA 03/03/2009  . BENIGN POSITIONAL VERTIGO 03/28/2008  . GERD 03/20/2008  . DERMATITIS, ATOPIC 03/13/2008  . SPINAL STENOSIS, LUMBAR 03/13/2008  . POSTMENOPAUSAL STATUS 02/26/2008  . ESSENTIAL HYPERTENSION, BENIGN 02/11/2008    Medications Prior to Visit Current Outpatient Prescriptions on File Prior to Visit  Medication Sig Dispense Refill  . aspirin 81 MG chewable tablet Chew 81 mg by mouth daily.        . cyclobenzaprine (FLEXERIL) 5 MG tablet Take 5 mg by mouth at bedtime as needed for muscle spasms.      . hydrochlorothiazide (HYDRODIURIL) 25 MG tablet Take 1 tablet (25 mg total) by mouth daily.  90 tablet  1  . nebivolol (BYSTOLIC) 10 MG tablet Take 1 tablet (10 mg total) by mouth daily.  30 tablet  6  . rosuvastatin (CRESTOR) 10 MG tablet Take 1 tablet (10 mg total) by mouth daily. 1/2 tab by mouth every other night  90 tablet  1  . triamcinolone ointment (KENALOG) 0.5 % Apply topically 2 (two) times daily.  30 g  1  . [DISCONTINUED] telmisartan (MICARDIS) 20 MG tablet Take 1 tablet (20 mg total) by mouth daily.  14 tablet  0   No current facility-administered medications on file prior to visit.    Current Medications (verified) Current Outpatient Prescriptions   Medication Sig Dispense Refill  . aspirin 81 MG chewable tablet Chew 81 mg by mouth daily.        . cyclobenzaprine (FLEXERIL) 5 MG tablet Take 5 mg by mouth at bedtime as needed for muscle spasms.      . hydrochlorothiazide (HYDRODIURIL) 25 MG tablet Take 1 tablet (25 mg total) by mouth daily.  90 tablet  1  . nebivolol (BYSTOLIC) 10 MG tablet Take 1 tablet (10 mg total) by mouth daily.  30 tablet  6  . rosuvastatin (CRESTOR) 10 MG tablet Take 1 tablet (10 mg total) by mouth daily. 1/2 tab by mouth every other night  90 tablet  1  . triamcinolone ointment (KENALOG) 0.5 % Apply topically 2 (two) times daily.  30 g  1  . [DISCONTINUED] telmisartan (MICARDIS) 20 MG tablet Take 1 tablet (20 mg total) by mouth daily.  14 tablet  0   No current facility-administered medications for this visit.     Allergies (verified) Ace inhibitors; Amlodipine; Benicar; Diltiazem; Losartan; Metoprolol; Micardis; and Penicillins   PAST HISTORY  Family History Family History  Problem Relation Age of Onset  . Hyperlipidemia Mother   . Hypertension Mother   . Diabetes Mother   . Prostate cancer Father   . Hypertension Brother     Social History History  Substance Use Topics  . Smoking status: Former Smoker    Types: Cigarettes  . Smokeless tobacco: Former Systems developer  Quit date: 12/05/1962  . Alcohol Use: Yes     Comment: Occasional glass of wine     Are there smokers in your home (other than you)? No  Risk Factors Current exercise habits: The patient does not participate in regular exercise at present.  Dietary issues discussed: None   Cardiac risk factors: advanced age (older than 33 for men, 63 for women) and hypertension.  Depression Screen (Note: if answer to either of the following is "Yes", a more complete depression screening is indicated)   Over the past two weeks, have you felt down, depressed or hopeless? No  Over the past two weeks, have you felt little interest or pleasure in doing  things? Yes  Have you lost interest or pleasure in daily life? No  Do you often feel hopeless? No  Do you cry easily over simple problems? No  Activities of Daily Living In your present state of health, do you have any difficulty performing the following activities?:  Driving? No Managing money?  No Feeding yourself? No Getting from bed to chair? No  Climbing a flight of stairs? No Preparing food and eating?: No Bathing or showering? No Getting dressed: No Getting to the toilet? No Using the toilet:No Moving around from place to place: No In the past year have you fallen or had a near fall?:No   Are you sexually active?  No  Do you have more than one partner?  No  Hearing Difficulties: No Do you often ask people to speak up or repeat themselves? No Do you experience ringing or noises in your ears? No Do you have difficulty understanding soft or whispered voices? No   Do you feel that you have a problem with memory? Yes  Do you often misplace items? No  Do you feel safe at home?  Yes  Cognitive Testing  Alert? Yes  Normal Appearance?Yes  Oriented to person? Yes  Place? Yes   Time? Yes     Advanced Directives have been discussed with the patient? Yes  List the Names of Other Physician/Practitioners you currently use: 1.    Indicate any recent Medical Services you may have received from other than [facility] providers in the past year (date may be approximate).  Immunization History  Administered Date(s) Administered  . Influenza Split 08/20/2012  . Influenza Whole 10/16/2009, 08/12/2011  . Influenza,inj,Quad PF,36+ Mos 09/30/2013  . Pneumococcal Polysaccharide-23 02/26/2008  . Td 12/05/2004  . Zoster 11/05/2013    Screening Tests Health Maintenance  Topic Date Due  . Influenza Vaccine  07/05/2014  . Tetanus/tdap  12/05/2014  . Colonoscopy  12/06/2014  . Mammogram  08/16/2015  . Pneumococcal Polysaccharide Vaccine Age 82 And Over  Completed  . Zostavax  Completed     All answers were reviewed with the patient and necessary referrals were made:  Joyce Leckey, MD   01/27/2014   History reviewed: allergies, current medications, past family history, past medical history, past social history, past surgical history and problem list  Review of Systems A comprehensive review of systems was negative.    Objective:     Vision by Snellen chart:  Eye exam scheduled.   Body mass index is 26.25 kg/(m^2). BP 189/82  Pulse 59  Wt 153 lb (69.4 kg)  BP 189/82  Pulse 59  Wt 153 lb (69.4 kg)  General Appearance:    Alert, cooperative, no distress, appears stated age  Head:    Normocephalic, without obvious abnormality, atraumatic  Eyes:    PERRL,  conjunctiva/corneas clear, EOM's intact, both eyes  Ears:    Normal TM's and external ear canals, both ears  Nose:   Nares normal, septum midline, mucosa normal, no drainage    or sinus tenderness  Throat:   Lips, mucosa, and tongue normal; teeth and gums normal  Neck:   Supple, symmetrical, trachea midline, no adenopathy;    thyroid:  no enlargement/tenderness/nodules; no carotid   bruit or JVD  Back:     Symmetric, no curvature, ROM normal, no CVA tenderness  Lungs:     Clear to auscultation bilaterally, respirations unlabored  Chest Wall:    No tenderness or deformity   Heart:    Regular rate and rhythm, S1 and S2 normal, no murmur, rub   or gallop  Breast Exam:    No tenderness, masses, or nipple abnormality  Abdomen:     Soft, non-tender, bowel sounds active all four quadrants,    no masses, no organomegaly  Genitalia:    Not performed  Rectal:    Not performed.   Extremities:   Extremities normal, atraumatic, no cyanosis or edema  Pulses:   2+ and symmetric all extremities  Skin:   Skin color, texture, turgor normal, no rashes or lesions  Lymph nodes:   Cervical, supraclavicular, and axillary nodes normal  Neurologic:   CNII-XII intact, normal strength, sensation and reflexes    throughout        Assessment:     Medicare annual Wellness Exam       Plan:     During the course of the visit the patient was educated and counseled about appropriate screening and preventive services including:    Bone densitometry screening- ordered.   HTN- not well controlled today.  Patient not interested in trying additional medications at this time.  Memory loss-patient has concerns of her memory loss. Will schedule for followup appointment to discuss further and complete a Mini-Mental Status exam.  Diet review for nutrition referral? Yes ____  Not Indicated _X_   Patient Instructions (the written plan) was given to the patient.  Medicare Attestation I have personally reviewed: The patient's medical and social history Their use of alcohol, tobacco or illicit drugs Their current medications and supplements The patient's functional ability including ADLs,fall risks, home safety risks, cognitive, and hearing and visual impairment Diet and physical activities Evidence for depression or mood disorders  The patient's weight, height, BMI, and visual acuity have been recorded in the chart.  I have made referrals, counseling, and provided education to the patient based on review of the above and I have provided the patient with a written personalized care plan for preventive services.     Camile Esters, MD   01/27/2014

## 2014-02-04 ENCOUNTER — Other Ambulatory Visit: Payer: Self-pay | Admitting: Family Medicine

## 2014-02-05 LAB — LIPID PANEL
CHOL/HDL RATIO: 3.7 ratio
Cholesterol: 175 mg/dL (ref 0–200)
HDL: 47 mg/dL (ref >39)
LDL Cholesterol: 107 mg/dL — ABNORMAL HIGH (ref 0–99)
Triglycerides: 103 mg/dL (ref <150)
VLDL: 21 mg/dL (ref 0–40)

## 2014-02-24 ENCOUNTER — Encounter: Payer: Self-pay | Admitting: Family Medicine

## 2014-02-24 ENCOUNTER — Ambulatory Visit (INDEPENDENT_AMBULATORY_CARE_PROVIDER_SITE_OTHER): Payer: Commercial Managed Care - HMO | Admitting: Family Medicine

## 2014-02-24 VITALS — BP 155/77 | HR 58 | Temp 98.3°F | Resp 14 | Wt 155.8 lb

## 2014-02-24 DIAGNOSIS — G4700 Insomnia, unspecified: Secondary | ICD-10-CM

## 2014-02-24 DIAGNOSIS — R413 Other amnesia: Secondary | ICD-10-CM

## 2014-02-24 DIAGNOSIS — R21 Rash and other nonspecific skin eruption: Secondary | ICD-10-CM

## 2014-02-24 DIAGNOSIS — I10 Essential (primary) hypertension: Secondary | ICD-10-CM

## 2014-02-24 MED ORDER — DOXAZOSIN MESYLATE 1 MG PO TABS: 1.0000 mg | ORAL_TABLET | Freq: Every day | ORAL | Status: DC

## 2014-02-24 NOTE — Progress Notes (Signed)
   Subjective:    Patient ID: Tina Medina, female    DOB: 09-Dec-1940, 73 y.o.   MRN: [id number]  HPI  Her for memory testing.  Says not sleeping well. Thinks it could be the bystolic.  Says feels more mentally sharper and sleeps better when doesn't take it.  Wakes up 2-3 times at night.  Often will be awake from midnight to 4AM.  Last night she did get 5 consectitve hours  Says will start doing something and forget what she was going to do. Will get to a stop sign and forget what she was going to do.  Her cataracts have been bothering her.  Her husband has dementia and this has been a big stressor for her.    Also has a rash on chest or back x 1 mo.  +itchey, more so on her back.  She thinks this may be from the bystolic as well.     Review of Systems     Objective:   Physical Exam  Constitutional: She is oriented to person, place, and time. She appears well-developed and well-nourished.  HENT:  Head: Normocephalic and atraumatic.  Cardiovascular: Normal rate, regular rhythm and normal heart sounds.   Pulmonary/Chest: Effort normal and breath sounds normal.  Neurological: She is alert and oriented to person, place, and time.  Skin: Skin is warm and dry.  Psychiatric: She has a normal mood and affect. Her behavior is normal.          Assessment & Plan:  Memory testing - MMSE score of 30/30 today.  I think her issues with memory recently have been related to stress levels and lack of sleep. Says she feels very strongly that the Bystolic is affecting her sleep quality will discontinue the medication. I explained her that this is not a typical side effect or reaction of the medication. She has failed multiple drugs and multiple drug classes. We have never tried an alpha-blocker. We'll start with Cardura. We'll start with a low dose of 1 mg and have her take at bedtime. Warned about potential for lightheadedness and dizziness. Lower blood pressure. I would like to see her back in 4-6 weeks  and we can adjust her dose. We also discussed considering a sleep aid to help with her insomnia. [name] not interested at this time. She mostly wants to stop which she feels is the offending agent first.  HTN- not well controlled today though she did hold [name] yesterday. Will try an alpha blocker in addition to her heart or thiazide.  Time spent 25 minutes, greater than 50% spent counseling about memory, insomnia, stress levels, blood pressure.

## 2014-02-28 ENCOUNTER — Other Ambulatory Visit: Payer: Self-pay | Admitting: Family Medicine

## 2014-03-24 ENCOUNTER — Ambulatory Visit: Payer: Medicare HMO | Admitting: Family Medicine

## 2014-03-27 ENCOUNTER — Ambulatory Visit: Payer: Medicare HMO | Admitting: Family Medicine

## 2014-05-19 ENCOUNTER — Ambulatory Visit (INDEPENDENT_AMBULATORY_CARE_PROVIDER_SITE_OTHER): Payer: Commercial Managed Care - HMO | Admitting: Family Medicine

## 2014-05-19 ENCOUNTER — Encounter: Payer: Self-pay | Admitting: Family Medicine

## 2014-05-19 VITALS — BP 161/84 | HR 62 | Wt 151.0 lb

## 2014-05-19 DIAGNOSIS — S139XXA Sprain of joints and ligaments of unspecified parts of neck, initial encounter: Secondary | ICD-10-CM

## 2014-05-19 DIAGNOSIS — M62838 Other muscle spasm: Secondary | ICD-10-CM

## 2014-05-19 DIAGNOSIS — S161XXA Strain of muscle, fascia and tendon at neck level, initial encounter: Secondary | ICD-10-CM

## 2014-05-19 NOTE — Progress Notes (Signed)
   Subjective:    Patient ID: Tina Medina, female    DOB: 13-Jun-1941, 73 y.o.   MRN: [id number]  HPI 3 days of right neck pain radiatint into the shoulder and the head.  Only Aleve helps. Feels swollen on the side of her neck. Not sleeping well with hit. Radiating to base of skull on that side.  Painis 7/10. Has been trying to do some home exercises.  Heat does help.    Review of Systems     Objective:   Physical Exam  Constitutional: She is oriented to person, place, and time. She appears well-developed and well-nourished.  HENT:  Head: Normocephalic.  Musculoskeletal:  Neck with normal flexion, decreased extension, significantly decreased rotation right and left because of pain. Decreased side bending right and left. [name] actually unable to bend more than about 30 to the side. Shoulders with normal range of motion. Negative T. can test. Strength is 5 out of 5 in the shoulders. [name] nontender over the shoulder joints. She is tender over the sternomastoid muscles and at the base of the occiput on the right side. Nontender over the cervical spine.  Neurological: She is alert and oriented to person, place, and time.          Assessment & Plan:  Right neck strain - continue Aleve since it does seem to be her very helpful. Recommend using heat and then doing the exercises, handout provided and apply ice for 5-10 minutes. Can repeat 2-3 times a day if can. Also discussed possibility of formal physical therapy but she says right now financially she cannot do that. Offered also a Toradol injection here in the office but she declined. Also recommended she try a muscle relaxer home. She cannot find a prescription then please let us know.  Trapezius strain-please see note above.

## 2014-07-28 ENCOUNTER — Ambulatory Visit (INDEPENDENT_AMBULATORY_CARE_PROVIDER_SITE_OTHER): Payer: Commercial Managed Care - HMO | Admitting: Family Medicine

## 2014-07-28 ENCOUNTER — Encounter: Payer: Self-pay | Admitting: Family Medicine

## 2014-07-28 VITALS — BP 158/78 | HR 67 | Wt 149.0 lb

## 2014-07-28 DIAGNOSIS — Z23 Encounter for immunization: Secondary | ICD-10-CM

## 2014-07-28 DIAGNOSIS — M79609 Pain in unspecified limb: Secondary | ICD-10-CM

## 2014-07-28 DIAGNOSIS — M79601 Pain in right arm: Secondary | ICD-10-CM

## 2014-07-28 NOTE — Patient Instructions (Addendum)
Recommend naproxen 500 mg up to twice a day for your arm pain and inflammation. Please review the exercises and try to do these at home. If your pain is not improving over the next 3 weeks I would like you to see Dr. Aundria Mems, or sports medicine physician for further evaluation.

## 2014-07-28 NOTE — Progress Notes (Signed)
   Subjective:    Patient ID: Tina Medina, female    DOB: October 16, 1941, 73 y.o.   MRN: [id number]  HPI Right arm pain for about a month or so.  She has been working in the yard a lot.  Her husband has not been able to do the yard work so she has been doing most of it. She says sometimes is actually difficult to lift her arm up past 90 but it's not every day. She denies any swelling but sometimes it hurts from her upper arm always down to her hand. No numbness or tingling though she does have pain in the fingertips on both hands but that is not new. 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 Please see x-ray done at [facility] under the scan documents section.   Review of Systems     Objective:   Physical Exam  Right shoulder with normal range of motion. She did have pain when she crossed for 90 but she was able to completely extend her arm. Normal internal and external rotation without significant discomfort or pain. Most of her pain is focused in her upper deltoid. Nontender over the shoulder joint itself. Neck with normal range of motion      Assessment & Plan:  Right arm pain-  To her that really thinks is coming from her shoulder. We'll give her handout on some stretches to do on her own. Recommend an anti-inflammatory if she can tolerate this. I would like her to consider seeing my partner Dr. Aundria Mems for further evaluation and to discuss treatment options.  Flu shot given today.

## 2014-08-15 ENCOUNTER — Ambulatory Visit (INDEPENDENT_AMBULATORY_CARE_PROVIDER_SITE_OTHER): Payer: Commercial Managed Care - HMO

## 2014-08-15 ENCOUNTER — Other Ambulatory Visit: Payer: Self-pay | Admitting: Family Medicine

## 2014-08-15 ENCOUNTER — Encounter: Payer: Self-pay | Admitting: Family Medicine

## 2014-08-15 ENCOUNTER — Ambulatory Visit (INDEPENDENT_AMBULATORY_CARE_PROVIDER_SITE_OTHER): Payer: Commercial Managed Care - HMO | Admitting: Family Medicine

## 2014-08-15 VITALS — BP 187/76 | HR 55 | Wt 151.0 lb

## 2014-08-15 DIAGNOSIS — M79645 Pain in left finger(s): Secondary | ICD-10-CM

## 2014-08-15 DIAGNOSIS — Z1231 Encounter for screening mammogram for malignant neoplasm of breast: Secondary | ICD-10-CM

## 2014-08-15 DIAGNOSIS — M79609 Pain in unspecified limb: Secondary | ICD-10-CM

## 2014-08-15 DIAGNOSIS — M159 Polyosteoarthritis, unspecified: Secondary | ICD-10-CM

## 2014-08-15 DIAGNOSIS — R0789 Other chest pain: Secondary | ICD-10-CM

## 2014-08-15 NOTE — Patient Instructions (Addendum)
Prilosec (omeprazole) or Nexium  (esomeprazole )  - Take them about 30 in before breakfast.   Call in 1 week if not better.      Food Choices for Gastroesophageal Reflux Disease When you have gastroesophageal reflux disease (GERD), the foods you eat and your eating habits are very important. Choosing the right foods can help ease the discomfort of GERD. WHAT GENERAL GUIDELINES DO I NEED TO FOLLOW?  Choose fruits, vegetables, whole grains, low-fat dairy products, and low-fat meat, fish, and poultry.  Limit fats such as oils, salad dressings, butter, nuts, and avocado.  Keep a food diary to identify foods that cause symptoms.  Avoid foods that cause reflux. These may be different for different people.  Eat frequent small meals instead of three large meals each day.  Eat your meals slowly, in a relaxed setting.  Limit fried foods.  Cook foods using methods other than frying.  Avoid drinking alcohol.  Avoid drinking large amounts of liquids with your meals.  Avoid bending over or lying down until 2-3 hours after eating. WHAT FOODS ARE NOT RECOMMENDED? The following are some foods and drinks that may worsen your symptoms: Vegetables Tomatoes. Tomato juice. Tomato and spaghetti sauce. Chili peppers. Onion and garlic. Horseradish. Fruits Oranges, grapefruit, and lemon (fruit and juice). Meats High-fat meats, fish, and poultry. This includes hot dogs, ribs, ham, sausage, salami, and bacon. Dairy Whole milk and chocolate milk. Sour cream. Cream. Butter. Ice cream. Cream cheese.  Beverages Coffee and tea, with or without caffeine. Carbonated beverages or energy drinks. Condiments Hot sauce. Barbecue sauce.  Sweets/Desserts Chocolate and cocoa. Donuts. Peppermint and spearmint. Fats and Oils High-fat foods, including [place] fries and potato chips. Other Vinegar. Strong spices, such as black pepper, white pepper, red pepper, cayenne, curry powder, cloves, ginger, and chili  powder. The items listed above may not be a complete list of foods and beverages to avoid. Contact your dietitian for more information. Document Released: 11/21/2005 Document Revised: 11/26/2013 Document Reviewed: 09/25/2013 [facility] Patient Information 2015 [place], [place]. This information is not intended to replace advice given to you by your health care provider. Make sure you discuss any questions you have with your health care provider.

## 2014-08-15 NOTE — Progress Notes (Signed)
   Subjective:    Patient ID: Tina Medina, female    DOB: [birth year], 73 y.o.   MRN: [id number]  Chest Pain    Has been having left sided CP and into her breast.  She has a hx  Of relfux.  Occ radiates into her back.  Can last all days.  No SOB. Has been really tired.  Not worse with position change. Worse with cleaning the house.  Has been more gassy and belching.  Not taking BP at home.  No recent URi. o pain with cough or sneezing. Did take one of her BP pills this AM. No nausea, vomiting or abdominal pain.  Left index finger pain for several months Says it hurts so bad at times she has to splint it. thik may be arthritis. She denies any known injury or trauma to that finger. She has a hard time completely flexing it at times. She says it even hurts so bad sometimes that she'll take a little stick and take that and actually splinted on her own for comfort.  Review of Systems  Cardiovascular: Positive for chest pain.       Objective:   Physical Exam  Constitutional: She is oriented to person, place, and time. She appears well-developed and well-nourished.  HENT:  Head: Normocephalic and atraumatic.  Cardiovascular: Normal rate, regular rhythm and normal heart sounds.   No carotid or abdominal bruits.   Pulmonary/Chest: Effort normal and breath sounds normal.  Abdominal: Soft. Bowel sounds are normal. She exhibits no distension. There is no tenderness.  Neurological: She is alert and oriented to person, place, and time.  Skin: Skin is warm and dry.  Psychiatric: She has a normal mood and affect. Her behavior is normal.   She does have some mild tenderness over the left index finger DIP. No nodularity. No swelling or erythema. Suspect most likely as arthritis. She has some slight decrease in flexion at that digit.      Assessment & Plan:  Atypical chest pain. Not typical of cardiac pain is actually last saw today. She describes it as mild as it does not interfere with her daily  activities. Because she has noticed some more reflux symptoms and gas lately on going to treat her with a PPI. If she's not getting significant relief in the next one to 2 weeks she is to call the office back and we'll consider ordering a stress test at that time. He EKG today was normal and reassuring. It showed a rate of 60 beats per minute, normal sinus rhythm with no acute ST-T wave changes and with a normal axis. If her pain suddenly gets severe intense she experiences nausea or vomiting or her pain radiates down to the left arm then she is to go to the emergency department immediately over the weekend. It's also important to keep her blood pressure under good control. Make sure she's taking her medications regularly. I encouraged her to start checking her blood pressure home again.  Pain left index finger, DIP-suspect most likely osteoarthritis. She is a member any trauma or injury but it's been severe enough that she's been splinting it on her own at home so I recommend an x-ray today for further evaluation. If negative then consider a possible steroid injection into the joint with her sports medicine physician.

## 2014-08-19 ENCOUNTER — Ambulatory Visit (INDEPENDENT_AMBULATORY_CARE_PROVIDER_SITE_OTHER): Payer: Commercial Managed Care - HMO

## 2014-08-19 DIAGNOSIS — Z1231 Encounter for screening mammogram for malignant neoplasm of breast: Secondary | ICD-10-CM

## 2014-08-27 ENCOUNTER — Ambulatory Visit (INDEPENDENT_AMBULATORY_CARE_PROVIDER_SITE_OTHER): Payer: Commercial Managed Care - HMO

## 2014-08-27 ENCOUNTER — Ambulatory Visit (INDEPENDENT_AMBULATORY_CARE_PROVIDER_SITE_OTHER): Payer: Commercial Managed Care - HMO | Admitting: Sports Medicine

## 2014-08-27 ENCOUNTER — Encounter: Payer: Self-pay | Admitting: Sports Medicine

## 2014-08-27 VITALS — BP 159/82 | HR 56 | Ht 64.0 in | Wt 149.0 lb

## 2014-08-27 DIAGNOSIS — M25519 Pain in unspecified shoulder: Secondary | ICD-10-CM

## 2014-08-27 DIAGNOSIS — M25512 Pain in left shoulder: Secondary | ICD-10-CM

## 2014-08-27 NOTE — Progress Notes (Signed)
Patient ID: Tina Medina, female   DOB: [birth year], 73 y.o.   MRN: [id number]   Subjective:    I'm seeing this patient as a consultation for: Beatrice Lecher  CC: Left shoulder pain  HPI: Tina Medina is a very pleasant 73 year old female who presents with several weeks of left shoulder pain that she localizes as over the biceps, subsequently radiating to the left scapula. States that this came on without history of trauma, although she does report recently starting to mow the grass since her husband has been diagnosed with prostate cancer.  Range of motion is full, but she states that the pain is significantly worse with overhead activity. No pain/radicular symptoms in the arm, but she does report some numbness in the left second and third fingers that has been present for several months. Of note, previous imaging in 2012 did reveal moderate cervical spondylosis most pronounced at C5-C6 and C6-C7. Denies neck pain associated with this new-onset shoulder pain.  Past medical history, Surgical history, Family history not pertinant except as noted below, Social history, Allergies, and medications have been entered into the medical record, reviewed, and no changes needed.   Review of Systems: No skin rash, fevers, chills, night sweats, weight loss, body aches, joint swelling or muscle aches.  Objective:   General: Well developed, well nourished, and in no acute distress.  Neuro/Psych: Alert and oriented x3, extra-ocular muscles intact, able to move all 4 extremities, sensation grossly intact. Skin: Warm and dry, no rashes noted.  Respiratory: Not using accessory muscles, speaking in full sentences, trachea midline.  Cardiovascular: Pulses palpable, no extremity edema. Abdomen: Does not appear distended.  Left Shoulder: Inspection reveals no abnormalities, atrophy or asymmetry. Palpation with no tenderness over AC joint or bicipital groove. ROM is full in all planes, minimally limited by pain  with abduction. Rotator cuff strength 4/5 with abduction, 4/5 with external rotation, and 5/5 with internal rotation. Negative Neer and Hawkin's tests. Positive empty can sign. Speeds and Yergason's tests normal. No labral pathology noted with negative Obrien's, negative clunk and good stability. Normal scapular function observed. Painful arc and present drop arm sign. No apprehension sign.  Impression and Recommendations:   This case required medical decision making of moderate complexity. Left Shoulder Pain: Rotator cuff syndrome is the most likely diagnosis in this patient with pain localizable over the deltoid that radiates into the scapula and is worse with overhead activities. This is further supported by the exam findings of limited rotator cuff strength, positive drop arm sign, painful arc and positive empty can sign. Numbness in her first and second fingers is likely attributable to her previously-identified degenerative disc disease. - Home rehabilitation - OTC Aleve - X-rays - Return in one month with plan to inject if no better.

## 2014-08-27 NOTE — Assessment & Plan Note (Signed)
Most likely related to rotator cuff syndrome. Home rehabilitation, she will take over-the-counter Aleve. X-rays. Return in one month, injection if no better.

## 2014-09-24 ENCOUNTER — Ambulatory Visit: Payer: Commercial Managed Care - HMO | Admitting: Sports Medicine

## 2014-10-07 ENCOUNTER — Other Ambulatory Visit: Payer: Self-pay | Admitting: *Deleted

## 2014-10-07 MED ORDER — NEBIVOLOL HCL 10 MG PO TABS: 10.0000 mg | ORAL_TABLET | Freq: Every day | ORAL | Status: DC

## 2014-12-19 ENCOUNTER — Ambulatory Visit (INDEPENDENT_AMBULATORY_CARE_PROVIDER_SITE_OTHER): Payer: Commercial Managed Care - HMO | Admitting: Family Medicine

## 2014-12-19 ENCOUNTER — Encounter: Payer: Self-pay | Admitting: Family Medicine

## 2014-12-19 VITALS — BP 158/90 | HR 61 | Ht 64.0 in | Wt 153.0 lb

## 2014-12-19 DIAGNOSIS — G4700 Insomnia, unspecified: Secondary | ICD-10-CM | POA: Diagnosis not present

## 2014-12-19 DIAGNOSIS — L723 Sebaceous cyst: Secondary | ICD-10-CM

## 2014-12-19 DIAGNOSIS — L720 Epidermal cyst: Secondary | ICD-10-CM | POA: Diagnosis not present

## 2014-12-19 MED ORDER — TRAZODONE HCL 50 MG PO TABS: 25.0000 mg | ORAL_TABLET | Freq: Every evening | ORAL | Status: DC | PRN

## 2014-12-19 NOTE — Progress Notes (Addendum)
   Subjective:    Patient ID: Tina Medina, female    DOB: [birth year], 74 y.o.   MRN: [id number]  HPI Here for seb cyst removal on her upper back near her neck on the right side. She says been getting more and has been tender. No active infection or drainage recently. She would like it removed. Next  Insomnia-she's having a really hard time sleeping. Unfortunately her husband snores but he has dementia and really wants her to be in the room with him so she feels obligated to sleep in there. Unfortunately it keeps her awake. Plus she feels like her stress levels are really high being his primary caretaker. She and partially doesn't have a lot of help. She doesn't have much family in the local area.   Review of Systems     Objective:   Physical Exam  Constitutional: She is oriented to person, place, and time. She appears well-developed and well-nourished.  HENT:  Head: Normocephalic and atraumatic.  Neurological: She is alert and oriented to person, place, and time.  Skin: Skin is warm and dry.  She has an approximate 2 cm sebaceous cyst on her right upper back between the neck and the shoulder.  Psychiatric: She has a normal mood and affect. Her behavior is normal.          Assessment & Plan:  Sebaceous Cyst Excision Procedure Note  Pre-operative Diagnosis: sec cyst  Post-operative Diagnosis: same  Locations:right lower neck near shoulder  Indications: getting larger  Anesthesia: Lidocaine 1% with epinephrine without added sodium bicarbonate  Procedure Details  History of allergy to iodine: no  Patient informed of the risks (including bleeding and infection) and benefits of the  procedure and Verbal informed consent obtained.  The lesion and surrounding area was given a sterile prep using chlorhexidine and draped in the usual sterile fashion. An incision was made over the cyst, which was dissected free of the surrounding tissue and removed.  The cyst was filled with  typical sebaceous material.  The wound was closed with 4-0 Ethilon using simple interrupted stitches. Antibiotic ointment and a sterile dressing applied.  The specimen was sent for pathologic examination. The patient tolerated the procedure well.  EBL: 0.5 ml  Findings: await pathology  Condition: Stable  Complications: none.  Plan: 1. Instructed to keep the wound dry and covered for 24-48h and clean thereafter. 2. Warning signs of infection were reviewed.   3. Recommended that the patient use OTC acetaminophen as needed for pain.  4. Return for suture removal in 10 days.   Insomnia- Recommend wear ear plugs, and we can surly consider a sleep aid. I think trazodone might be a good choice. It may help with mood as well as sleep. Start with half a tab and increase up to 2 tabs if needed. If not doing well with the medication or responding or having significant side effects and please let me know. Otherwise will follow back up in 6 weeks to see how she's doing on it.

## 2014-12-29 ENCOUNTER — Ambulatory Visit (INDEPENDENT_AMBULATORY_CARE_PROVIDER_SITE_OTHER): Payer: Commercial Managed Care - HMO | Admitting: Family Medicine

## 2014-12-29 DIAGNOSIS — I10 Essential (primary) hypertension: Secondary | ICD-10-CM

## 2014-12-29 DIAGNOSIS — Z4802 Encounter for removal of sutures: Secondary | ICD-10-CM

## 2014-12-29 NOTE — Progress Notes (Signed)
   Subjective:    Patient ID: Tina Medina, female    DOB: [birth year], 74 y.o.   MRN: [id number]  HPI    Review of Systems     Objective:   Physical Exam        Assessment & Plan:  Sutures removed from incisional site on posterior right side of neck-patient doing very well. Clean dry and intact. No active drainage or erythema. No tenderness or pain. Petrolatum ointment applied

## 2014-12-29 NOTE — Patient Instructions (Signed)
Apply Vaseline daily for the next 2-3 weeks.  Don't have to cover with bandaid but can if would like.

## 2015-01-30 ENCOUNTER — Ambulatory Visit (INDEPENDENT_AMBULATORY_CARE_PROVIDER_SITE_OTHER): Payer: Commercial Managed Care - HMO | Admitting: Family Medicine

## 2015-01-30 ENCOUNTER — Encounter: Payer: Self-pay | Admitting: Family Medicine

## 2015-01-30 VITALS — BP 138/82 | HR 76 | Wt 154.0 lb

## 2015-01-30 DIAGNOSIS — Z0000 Encounter for general adult medical examination without abnormal findings: Secondary | ICD-10-CM | POA: Diagnosis not present

## 2015-01-30 DIAGNOSIS — I10 Essential (primary) hypertension: Secondary | ICD-10-CM | POA: Diagnosis not present

## 2015-01-30 NOTE — Progress Notes (Signed)
Subjective:    Tina Medina is a 74 y.o. female who presents for Medicare Annual/Subsequent preventive examination.  Preventive Screening-Counseling & Management  Tobacco History  Smoking status  . Former Smoker  . Types: Cigarettes  Smokeless tobacco  . Former Systems developer  . Quit date: 12/05/1962     Problems Prior to Visit 1. Dry itchey skin on shins. She uses the cream I have Rx. Tries to hydrate well.   Current Problems (verified) Patient Active Problem List   Diagnosis Date Noted  . Left shoulder pain 08/27/2014  . Depression 01/12/2012  . Neck pain, chronic 04/18/2011  . NIGHT SWEATS 01/17/2011  . CAROTID BRUIT, LEFT 01/17/2011  . PERSONAL HISTORY OF COLONIC POLYPS 01/17/2011  . ALLERGIC RHINITIS 09/03/2010  . GOITER, MULTINODULAR 07/23/2010  . HYPERLIPIDEMIA 05/10/2010  . MENINGIOMA 03/03/2009  . BENIGN POSITIONAL VERTIGO 03/28/2008  . GERD 03/20/2008  . DERMATITIS, ATOPIC 03/13/2008  . SPINAL STENOSIS, LUMBAR 03/13/2008  . POSTMENOPAUSAL STATUS 02/26/2008  . ESSENTIAL HYPERTENSION, BENIGN 02/11/2008    Medications Prior to Visit Current Outpatient Prescriptions on File Prior to Visit  Medication Sig Dispense Refill  . aspirin 81 MG chewable tablet Chew 81 mg by mouth daily.      . hydrochlorothiazide (HYDRODIURIL) 25 MG tablet Take 1 tablet (25 mg total) by mouth daily. 90 tablet 1  . nebivolol (BYSTOLIC) 10 MG tablet Take 1 tablet (10 mg total) by mouth daily. 30 tablet 6  . rosuvastatin (CRESTOR) 10 MG tablet Take 1 tablet (10 mg total) by mouth daily. 1/2 tab by mouth every other night 90 tablet 1  . [DISCONTINUED] telmisartan (MICARDIS) 20 MG tablet Take 1 tablet (20 mg total) by mouth daily. 14 tablet 0   No current facility-administered medications on file prior to visit.    Current Medications (verified) Current Outpatient Prescriptions  Medication Sig Dispense Refill  . aspirin 81 MG chewable tablet Chew 81 mg by mouth daily.      .  hydrochlorothiazide (HYDRODIURIL) 25 MG tablet Take 1 tablet (25 mg total) by mouth daily. 90 tablet 1  . nebivolol (BYSTOLIC) 10 MG tablet Take 1 tablet (10 mg total) by mouth daily. 30 tablet 6  . rosuvastatin (CRESTOR) 10 MG tablet Take 1 tablet (10 mg total) by mouth daily. 1/2 tab by mouth every other night 90 tablet 1  . [DISCONTINUED] telmisartan (MICARDIS) 20 MG tablet Take 1 tablet (20 mg total) by mouth daily. 14 tablet 0   No current facility-administered medications for this visit.     Allergies (verified) Ace inhibitors; Amlodipine; Atenolol; Benicar; Diltiazem; Losartan; Metoprolol; Micardis; Penicillins; and Bystolic   PAST HISTORY  Family History Family History  Problem Relation Age of Onset  . Hyperlipidemia Mother   . Hypertension Mother   . Diabetes Mother   . Prostate cancer Father   . Hypertension Brother     Social History History  Substance Use Topics  . Smoking status: Former Smoker    Types: Cigarettes  . Smokeless tobacco: Former Systems developer    Quit date: 12/05/1962  . Alcohol Use: Yes     Comment: Occasional glass of wine     Are there smokers in your home (other than you)? No  Risk Factors Current exercise habits: 5 min squats in the morning and evening  Dietary issues discussed: None   Cardiac risk factors: hypertension.  Depression Screen (Note: if answer to either of the following is "Yes", a more complete depression screening is indicated)   Over the  past two weeks, have you felt down, depressed or hopeless? No  Over the past two weeks, have you felt little interest or pleasure in doing things? No  Have you lost interest or pleasure in daily life? No  Do you often feel hopeless? No  Do you cry easily over simple problems? No  Activities of Daily Living In your present state of health, do you have any difficulty performing the following activities?:  Driving? No Managing money?  No Feeding yourself? No Getting from bed to chair?  No Climbing a flight of stairs? No Preparing food and eating?: No Bathing or showering? No Getting dressed: No Getting to the toilet? No Using the toilet:No Moving around from place to place: No In the past year have you fallen or had a near fall?:No   Are you sexually active?  No  Do you have more than one partner?  No  Hearing Difficulties: No Do you often ask people to speak up or repeat themselves? No Do you experience ringing or noises in your ears? Yes Do you have difficulty understanding soft or whispered voices? No   Do you feel that you have a problem with memory? Yes  Do you often misplace items? Yes  Do you feel safe at home?  Yes  Cognitive Testing  Alert? Yes  Normal Appearance?Yes  Oriented to person? Yes  Place? Yes   Time? Yes  Recall of three objects?  Yes  Can perform simple calculations? Yes  Displays appropriate judgment?Yes  Can read the correct time from a watch face?Yes  6 CIT score of 4/28   Advanced Directives have been discussed with the patient? Yes  List the Names of Other Physician/Practitioners you currently use: 1.    Indicate any recent Medical Services you may have received from other than [facility] providers in the past year (date may be approximate).  Immunization History  Administered Date(s) Administered  . Influenza Split 08/20/2012  . Influenza Whole 10/16/2009, 08/12/2011  . Influenza,inj,Quad PF,36+ Mos 09/30/2013, 07/28/2014  . Pneumococcal Polysaccharide-23 02/26/2008  . Td 12/05/2004  . Zoster 11/05/2013    Screening Tests Health Maintenance  Topic Date Due  . TETANUS/TDAP  12/05/2014  . COLONOSCOPY  12/06/2014  . INFLUENZA VACCINE  07/06/2015  . DEXA SCAN  Completed  . PNEUMOCOCCAL POLYSACCHARIDE VACCINE AGE 25 AND OVER  Completed  . ZOSTAVAX  Completed    All answers were reviewed with the patient and necessary referrals were made:  Shereen Marton, MD   01/30/2015   History reviewed: allergies, current  medications, past family history, past medical history, past social history, past surgical history and problem list  Review of Systems A comprehensive review of systems was negative.    Objective:     Vision by Snellen chart:  Eye exam is UTD.   Body mass index is 26.42 kg/(m^2). BP 148/88 mmHg  Pulse 76  Wt 154 lb (69.854 kg)  BP 148/88 mmHg  Pulse 76  Wt 154 lb (69.854 kg) General appearance: alert, cooperative and appears stated age Head: Normocephalic, without obvious abnormality, atraumatic Eyes: conj clear, EOMi, PEERLA Ears: normal TM's and external ear canals both ears Nose: Nares normal. Septum midline. Mucosa normal. No drainage or sinus tenderness. Throat: lips, mucosa, and tongue normal; teeth and gums normal Neck: no adenopathy, no carotid bruit, no JVD, supple, symmetrical, trachea midline and thyroid not enlarged, symmetric, no tenderness/mass/nodules Back: symmetric, no curvature. ROM normal. No CVA tenderness. Lungs: clear to auscultation bilaterally Breasts: normal appearance, no  masses or tenderness Heart: regular rate and rhythm, S1, S2 normal, no murmur, click, rub or gallop Abdomen: soft, non-tender; bowel sounds normal; no masses,  no organomegaly Extremities: extremities normal, atraumatic, no cyanosis or edema Pulses: 2+ and symmetric Skin: Skin color, texture, turgor normal. No rashes or lesions Lymph nodes: Cervical, supraclavicular, and axillary nodes normal. Neurologic: Alert and oriented X 3, normal strength and tone. Normal symmetric reflexes. Normal coordination and gait     Assessment:     Annual Medicare Wellness EXam       Plan:     During the course of the visit the patient was educated and counseled about appropriate screening and preventive services including:    Pneumococcal vaccine   Td vaccine - declined today    Known dx of depression -PHQ- 9 score of 8 today.   Dry itchey skin on his shins.   HTN - repeat BP looks  great today.   Diet review for nutrition referral? Yes ____  Not Indicated x__   Patient Instructions (the written plan) was given to the patient.  Medicare Attestation I have personally reviewed: The patient's medical and social history Their use of alcohol, tobacco or illicit drugs Their current medications and supplements The patient's functional ability including ADLs,fall risks, home safety risks, cognitive, and hearing and visual impairment Diet and physical activities Evidence for depression or mood disorders  The patient's weight, height, BMI, and visual acuity have been recorded in the chart.  I have made referrals, counseling, and provided education to the patient based on review of the above and I have provided the patient with a written personalized care plan for preventive services.     Fergus Throne, MD   01/30/2015

## 2015-01-31 LAB — COMPLETE METABOLIC PANEL WITH GFR
ALBUMIN: 4.1 g/dL (ref 3.5–5.2)
ALK PHOS: 66 U/L (ref 39–117)
ALT: 13 U/L (ref 0–35)
AST: 21 U/L (ref 0–37)
BUN: 10 mg/dL (ref 6–23)
CO2: 28 meq/L (ref 19–32)
Calcium: 9.5 mg/dL (ref 8.4–10.5)
Chloride: 106 mEq/L (ref 96–112)
Creat: 0.91 mg/dL (ref 0.50–1.10)
GFR, Est African American: 71 mL/min
GFR, Est Non African American: 61 mL/min
GLUCOSE: 87 mg/dL (ref 70–99)
Potassium: 4.5 mEq/L (ref 3.5–5.3)
SODIUM: 142 meq/L (ref 135–145)
TOTAL PROTEIN: 6.7 g/dL (ref 6.0–8.3)
Total Bilirubin: 0.6 mg/dL (ref 0.2–1.2)

## 2015-01-31 LAB — LIPID PANEL
Cholesterol: 164 mg/dL (ref 0–200)
HDL: 49 mg/dL (ref >=46)
LDL Cholesterol: 100 mg/dL — ABNORMAL HIGH (ref 0–99)
Total CHOL/HDL Ratio: 3.3 Ratio
Triglycerides: 77 mg/dL (ref <150)
VLDL: 15 mg/dL (ref 0–40)

## 2015-02-10 DIAGNOSIS — H527 Unspecified disorder of refraction: Secondary | ICD-10-CM | POA: Diagnosis not present

## 2015-02-10 DIAGNOSIS — H01001 Unspecified blepharitis right upper eyelid: Secondary | ICD-10-CM | POA: Diagnosis not present

## 2015-02-10 DIAGNOSIS — H52223 Regular astigmatism, bilateral: Secondary | ICD-10-CM | POA: Diagnosis not present

## 2015-02-10 DIAGNOSIS — H25813 Combined forms of age-related cataract, bilateral: Secondary | ICD-10-CM | POA: Diagnosis not present

## 2015-02-10 DIAGNOSIS — H01004 Unspecified blepharitis left upper eyelid: Secondary | ICD-10-CM | POA: Diagnosis not present

## 2015-03-31 DIAGNOSIS — H5203 Hypermetropia, bilateral: Secondary | ICD-10-CM | POA: Diagnosis not present

## 2015-03-31 DIAGNOSIS — H52209 Unspecified astigmatism, unspecified eye: Secondary | ICD-10-CM | POA: Diagnosis not present

## 2015-03-31 DIAGNOSIS — H5213 Myopia, bilateral: Secondary | ICD-10-CM | POA: Diagnosis not present

## 2015-03-31 DIAGNOSIS — Z0100 Encounter for examination of eyes and vision without abnormal findings: Secondary | ICD-10-CM | POA: Diagnosis not present

## 2015-03-31 DIAGNOSIS — H524 Presbyopia: Secondary | ICD-10-CM | POA: Diagnosis not present

## 2015-05-12 ENCOUNTER — Encounter: Payer: Self-pay | Admitting: Family Medicine

## 2015-05-12 ENCOUNTER — Ambulatory Visit (INDEPENDENT_AMBULATORY_CARE_PROVIDER_SITE_OTHER): Payer: Commercial Managed Care - HMO | Admitting: Family Medicine

## 2015-05-12 VITALS — BP 168/82 | HR 52 | Ht 64.0 in | Wt 153.0 lb

## 2015-05-12 DIAGNOSIS — F430 Acute stress reaction: Secondary | ICD-10-CM

## 2015-05-12 DIAGNOSIS — F329 Major depressive disorder, single episode, unspecified: Secondary | ICD-10-CM

## 2015-05-12 DIAGNOSIS — F32A Depression, unspecified: Secondary | ICD-10-CM

## 2015-05-12 DIAGNOSIS — Z23 Encounter for immunization: Secondary | ICD-10-CM | POA: Diagnosis not present

## 2015-05-12 DIAGNOSIS — I10 Essential (primary) hypertension: Secondary | ICD-10-CM | POA: Diagnosis not present

## 2015-05-12 DIAGNOSIS — G4700 Insomnia, unspecified: Secondary | ICD-10-CM

## 2015-05-12 MED ORDER — ZOLPIDEM TARTRATE 5 MG PO TABS: 5.0000 mg | ORAL_TABLET | Freq: Every evening | ORAL | Status: DC | PRN

## 2015-05-12 NOTE — Progress Notes (Signed)
   Subjective:    Patient ID: Tina Medina, female    DOB: 1941/07/10, 74 y.o.   MRN: [id number]  HPI Hypertension- Pt denies chest pain, SOB, dizziness, or heart palpitations.  Taking meds as directed w/o problems.  Denies medication side effects.    Insomnia - says has had problems for years and has been worse lately. Says used ambien years ago and wa really really helpful. She didn't have any side effects.  Says probably gets about 5 hours total. Wakes up frequently.  Hard time falling asleep as well.   She is moving back to [place] at the end of the month. [name] having to do it by herself as her husband has Alzheimer's and she is his primary care provider. He's also been having some new medical issues that required marked multiple doctors appointments. She does have 2 daughters who live in [place] so she is excited to be moving back there.  Review of Systems     Objective:   Physical Exam  Constitutional: She is oriented to person, place, and time. She appears well-developed and well-nourished.  HENT:  Head: Normocephalic and atraumatic.  Cardiovascular: Normal rate, regular rhythm and normal heart sounds.   Pulmonary/Chest: Effort normal and breath sounds normal.  Neurological: She is alert and oriented to person, place, and time.  Skin: Skin is warm and dry.  Psychiatric: She has a normal mood and affect. Her behavior is normal.          Assessment & Plan:  HTN - blood pressure is elevated today. It actually looked great and was at goal 3 months ago. [name] not been sleeping on his been under a lot of stress which I really think her major driver's for her blood pressure. If blood pressure stays elevated we can always consider increasing the Bystolic. [name] had multiple intolerances to different blood pressure medications.  Insomnia- she would really like to retry Ambien which she had taken in the past. We'll start with 5 mg by mouth warned about potential for sedation and that  it can be habit-forming. Encouraged her to use it for sure. At this time and then as needed.  Acute stress/Depression-discussed options. We could even consider putting her on an SSRI for a period of time to help her with the move and transition back to [place]. Initially she wanted to do so but decided that she wanted to think about it and maybe try the sleep medication first to see if it helped her feel better. I think this move will be good for her and will give her a lot more family support in helping to take care of her husband.

## 2015-10-08 ENCOUNTER — Telehealth: Payer: Self-pay | Admitting: *Deleted

## 2015-10-22 NOTE — Telephone Encounter (Signed)
Records mailed.Tina Medina

## 2018-06-26 ENCOUNTER — Telehealth: Payer: Self-pay

## 2018-06-26 NOTE — Telephone Encounter (Signed)
Kealie and her husband are moving back from [place] and would like to reestablish with Dr Madilyn Fireman. Please advise.

## 2018-06-26 NOTE — Telephone Encounter (Signed)
Ok to re-estab.

## 2018-06-27 NOTE — Telephone Encounter (Signed)
Left message advising of recommendations.  

## 2018-08-20 ENCOUNTER — Ambulatory Visit: Payer: Commercial Managed Care - HMO | Admitting: Family Medicine

## 2018-08-30 ENCOUNTER — Ambulatory Visit (INDEPENDENT_AMBULATORY_CARE_PROVIDER_SITE_OTHER): Payer: Medicare HMO | Admitting: Family Medicine

## 2018-08-30 ENCOUNTER — Encounter: Payer: Self-pay | Admitting: Family Medicine

## 2018-08-30 VITALS — BP 192/67 | HR 54 | Ht 64.0 in | Wt 145.0 lb

## 2018-08-30 DIAGNOSIS — Z833 Family history of diabetes mellitus: Secondary | ICD-10-CM

## 2018-08-30 DIAGNOSIS — L299 Pruritus, unspecified: Secondary | ICD-10-CM | POA: Diagnosis not present

## 2018-08-30 DIAGNOSIS — Z23 Encounter for immunization: Secondary | ICD-10-CM

## 2018-08-30 DIAGNOSIS — R200 Anesthesia of skin: Secondary | ICD-10-CM

## 2018-08-30 DIAGNOSIS — R490 Dysphonia: Secondary | ICD-10-CM

## 2018-08-30 DIAGNOSIS — L309 Dermatitis, unspecified: Secondary | ICD-10-CM | POA: Diagnosis not present

## 2018-08-30 DIAGNOSIS — E041 Nontoxic single thyroid nodule: Secondary | ICD-10-CM

## 2018-08-30 DIAGNOSIS — R110 Nausea: Secondary | ICD-10-CM

## 2018-08-30 DIAGNOSIS — H9202 Otalgia, left ear: Secondary | ICD-10-CM

## 2018-08-30 DIAGNOSIS — I10 Essential (primary) hypertension: Secondary | ICD-10-CM | POA: Diagnosis not present

## 2018-08-30 MED ORDER — TRIAMCINOLONE ACETONIDE 0.5 % EX OINT: TOPICAL_OINTMENT | Freq: Every evening | CUTANEOUS | 1 refills | Status: DC | PRN

## 2018-08-30 NOTE — Progress Notes (Signed)
Subjective:    Patient ID: Tina Medina, female    DOB: Oct 28, 1941, 77 y.o.   MRN: [id number]  HPI 77 year old female is here today to reestablish care.  She actually moved to [place] in June 2016 with her husband.  Unfortunately he actually passed away about 3 weeks ago after they had just moved back here 3 or 4 days prior.  He passed away from cancer.  She does have a couple of concerns that she would like to discuss today:  She is been experiencing some nausea.  She admits that when she was taking care of her husband she was not eating regularly she would often skip meals and actually lost about 20 pounds.  But since then she is been eating more regularly and has been trying to make a little bit better food choices.  She will occasionally take some Tums for it.  He also complains that her voice has been raspy for the last month.  She denies any sinus symptoms nasal congestion or drainage.  She denies any excess straining her voice.  No pain.  She denies any reflux symptoms.  She is still experiencing some numbness and tingling in her feet.  And at times she gets itching around her ankles.  That is actually been going on for years but now the itching goes from her ankles up to about her knees.  Gets trace bilateral ankle edema but gets worse as the day goes on.  She has some occasional pain in her left ear that radiates down towards her neck and down to her left jaw.  She is had a provider look at it previously and they told her it was a beer buildup of dry skin in her ear.       Review of Systems  BP (!) 192/67   Pulse (!) 54   Ht 5\' 4"  (1.626 m)   Wt 145 lb (65.8 kg)   SpO2 100%   BMI 24.89 kg/m     Allergies  Allergen Reactions  . Ace Inhibitors     REACTION: cough  . Amlodipine   . Atenolol   . Benicar [Olmesartan Medoxomil] Other (See Comments)    Chest pain  . Diltiazem Other (See Comments)    Sharp head ache.   . Losartan Other (See Comments)    headache  .  Metoprolol Diarrhea    Abdominal cramping.   . Micardis [Telmisartan] Other (See Comments)    Chest Pain, Back Pain.   [name] Penicillins     REACTION: rash    Past Medical History:  Diagnosis Date  . DDD (degenerative disc disease), lumbar    L spine, spinal stenosis  . Gastritis    H Pylori  . Hyperlipidemia   . Hypertension   . Lumbar degenerative disc disease 2002   with stenosis  . 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 Menopause    since age 42  . Migraine   . Osteoma    2 mm of the L lid ethmoid air cells    Past Surgical History:  Procedure Laterality Date  . BREAST SURGERY  1969   Left    Social History   Socioeconomic History  . Marital status: Widowed    Spouse name: Not on file  . Number of children: 3  . Years of education: Not on file  . Highest education level: Not on file  Occupational History  . Occupation: Retired.  Employer: [employer]    Comment: Retired  Scientific laboratory technician  . Financial resource strain: Not on file  . Food insecurity:    Worry: Not on file    Inability: Not on file  . Transportation needs:    Medical: Not on file    Non-medical: Not on file  Tobacco Use  . Smoking status: Former Smoker    Types: Cigarettes  . Smokeless tobacco: Former Systems developer    Quit date: 12/05/1962  Substance and Sexual Activity  . Alcohol use: Yes    Comment: Occasional glass of wine  . Drug use: No  . Sexual activity: Not on file  Lifestyle  . Physical activity:    Days per week: Not on file    Minutes per session: Not on file  . Stress: Not on file  Relationships  . Social connections:    Talks on phone: Not on file    Gets together: Not on file    Attends religious service: Not on file    Active member of club or organization: Not on file    Attends meetings of clubs or organizations: Not on file    Relationship status: Not on file  . Intimate partner violence:    Fear of current or ex partner: Not on file    Emotionally  abused: Not on file    Physically abused: Not on file    Forced sexual activity: Not on file  Other Topics Concern  . Not on file  Social History Narrative   Some exercise.     Family History  Problem Relation Age of Onset  . Hyperlipidemia Mother   . Hypertension Mother   . Diabetes Mother   . Prostate cancer Father   . Hypertension Brother     Outpatient Encounter Medications as of 08/30/2018  Medication Sig  . aspirin 81 MG chewable tablet Chew 81 mg by mouth daily.    . nebivolol (BYSTOLIC) 10 MG tablet Take 1 tablet (10 mg total) by mouth daily.  . rosuvastatin (CRESTOR) 5 MG tablet Take 5 mg by mouth at bedtime.  . triamcinolone ointment (KENALOG) 0.5 % Apply topically at bedtime as needed. Itching ankles  . [DISCONTINUED] hydrochlorothiazide (HYDRODIURIL) 25 MG tablet Take 1 tablet (25 mg total) by mouth daily.  . [DISCONTINUED] rosuvastatin (CRESTOR) 10 MG tablet Take 1 tablet (10 mg total) by mouth daily. 1/2 tab by mouth every other night  . [DISCONTINUED] telmisartan (MICARDIS) 20 MG tablet Take 1 tablet (20 mg total) by mouth daily.  . [DISCONTINUED] zolpidem (AMBIEN) 5 MG tablet Take 1 tablet (5 mg total) by mouth at bedtime as needed for sleep.   No facility-administered encounter medications on file as of 08/30/2018.          Objective:   Physical Exam  Constitutional: She is oriented to person, place, and time. She appears well-developed and well-nourished.  HENT:  Head: Normocephalic and atraumatic.  Right Ear: External ear normal.  Left Ear: External ear normal.  Nose: Nose normal.  Mouth/Throat: Oropharynx is clear and moist.  TMs and canals are clear.   Eyes: Pupils are equal, round, and reactive to light. Conjunctivae and EOM are normal.  Neck: Neck supple. No thyromegaly present.  Cardiovascular: Normal rate, regular rhythm and normal heart sounds.  Right TM canal is clear.  Left TM is blocked by cerumen and dried skin, unable to visualize the  tympanic membrane.  Pulmonary/Chest: Effort normal and breath sounds normal. She has no wheezes.  Abdominal:  Soft. Bowel sounds are normal. She exhibits no distension and no mass. There is no tenderness. There is no rebound and no guarding. No hernia.  Lymphadenopathy:    She has no cervical adenopathy.  Neurological: She is alert and oriented to person, place, and time.  Skin: Skin is warm and dry.  Psychiatric: She has a normal mood and affect.          Assessment & Plan:  Grieving -overall she is actually doing okay.  Did encourage her to consider counseling through hospice if she would like.  Raspy voice -really not having any allergy symptoms or upper respiratory symptoms or reflux symptoms some going to refer her to ENT for further work-up since she is Artie had persistent symptoms for about 3 to 4 weeks already.  Nausea -could believe due to reflux/gastritis or increased stress not clear.  But she certainly could try over-the-counter Zantac for the next couple of days.  She is eating a little better and that may help as well.  She can also use Tums as needed.  Like to check an amylase and lipase as well as a CBC.  Bilateral ankle edema-clear from venous stasis based on her description of the swelling.  Recommend compression stockings and elevate legs when she is able.  Keep salt intake to a minimum.  Itchy skin on her legs-consistent with atopic dermatitis.  She already has some hyperpigmentation from scratching.  Her prescription for triamcinolone ointment.  Numbness and tingling of her toes-could be related to neuropathy and could even be coming from her back she has a history of spinal stenosis.  Thyroid nodule-due to recheck TSH.  Cerumen impaction-Recommend a trial of Debrox.  Left ear pain - no pain today, will monitir.    Family history diabetes-check hemoglobin A1c

## 2018-08-30 NOTE — Patient Instructions (Addendum)
Recommend Debrox drops.  Place 4 drops in the ear twice a day for 4 days, then take a break for 3 days then repeat for 4 days, then take a break for another 3 days.  Repeat this process for about 4 weeks.  Can try Zantac 75mg  twice a day for 10 days and see if helps you nausea.

## 2018-08-31 ENCOUNTER — Encounter: Payer: Self-pay | Admitting: Family Medicine

## 2018-09-03 ENCOUNTER — Telehealth: Payer: Self-pay | Admitting: Family Medicine

## 2018-09-03 DIAGNOSIS — R490 Dysphonia: Secondary | ICD-10-CM | POA: Diagnosis not present

## 2018-09-03 DIAGNOSIS — L299 Pruritus, unspecified: Secondary | ICD-10-CM | POA: Diagnosis not present

## 2018-09-03 DIAGNOSIS — R110 Nausea: Secondary | ICD-10-CM | POA: Diagnosis not present

## 2018-09-03 DIAGNOSIS — M79672 Pain in left foot: Principal | ICD-10-CM

## 2018-09-03 DIAGNOSIS — R200 Anesthesia of skin: Secondary | ICD-10-CM | POA: Diagnosis not present

## 2018-09-03 DIAGNOSIS — E041 Nontoxic single thyroid nodule: Secondary | ICD-10-CM | POA: Diagnosis not present

## 2018-09-03 DIAGNOSIS — Z833 Family history of diabetes mellitus: Secondary | ICD-10-CM | POA: Diagnosis not present

## 2018-09-03 DIAGNOSIS — I10 Essential (primary) hypertension: Secondary | ICD-10-CM | POA: Diagnosis not present

## 2018-09-03 DIAGNOSIS — M79671 Pain in right foot: Secondary | ICD-10-CM

## 2018-09-03 NOTE — Telephone Encounter (Signed)
Patient is requesting a referral to a podiatrist. She does not have a preference as to who. Please advise patient. Thanks!

## 2018-09-03 NOTE — Telephone Encounter (Signed)
Ok, new referal placed.

## 2018-09-03 NOTE — Telephone Encounter (Signed)
Left VM with status update.  

## 2018-09-04 LAB — COMPLETE METABOLIC PANEL WITH GFR
AG Ratio: 1.6 (calc) (ref 1.0–2.5)
ALKALINE PHOSPHATASE (APISO): 59 U/L (ref 33–130)
ALT: 10 U/L (ref 6–29)
AST: 16 U/L (ref 10–35)
Albumin: 3.9 g/dL (ref 3.6–5.1)
BILIRUBIN TOTAL: 0.5 mg/dL (ref 0.2–1.2)
BUN: 10 mg/dL (ref 7–25)
CHLORIDE: 109 mmol/L (ref 98–110)
CO2: 30 mmol/L (ref 20–32)
Calcium: 9.3 mg/dL (ref 8.6–10.4)
Creat: 0.93 mg/dL (ref 0.60–0.93)
GFR, Est African American: 68 mL/min/{1.73_m2} (ref >=60)
GFR, Est Non African American: 58 mL/min/{1.73_m2} — ABNORMAL LOW (ref >=60)
Globulin: 2.5 g/dL (calc) (ref 1.9–3.7)
Glucose, Bld: 90 mg/dL (ref 65–99)
Potassium: 4.4 mmol/L (ref 3.5–5.3)
Sodium: 144 mmol/L (ref 135–146)
Total Protein: 6.4 g/dL (ref 6.1–8.1)

## 2018-09-04 LAB — CBC
HCT: 37.2 % (ref 35.0–45.0)
Hemoglobin: 12.5 g/dL (ref 11.7–15.5)
MCH: 31.6 pg (ref 27.0–33.0)
MCHC: 33.6 g/dL (ref 32.0–36.0)
MCV: 94.2 fL (ref 80.0–100.0)
MPV: 11.8 fL (ref 7.5–12.5)
Platelets: 264 10*3/uL (ref 140–400)
RBC: 3.95 10*6/uL (ref 3.80–5.10)
RDW: 11.9 % (ref 11.0–15.0)
WBC: 6.6 10*3/uL (ref 3.8–10.8)

## 2018-09-04 LAB — HEMOGLOBIN A1C
EAG (MMOL/L): 6 (calc)
Hgb A1c MFr Bld: 5.4 % of total Hgb (ref <5.7)
Mean Plasma Glucose: 108 (calc)

## 2018-09-04 LAB — LIPID PANEL
CHOLESTEROL: 177 mg/dL (ref <200)
HDL: 50 mg/dL — ABNORMAL LOW (ref >50)
LDL Cholesterol (Calc): 110 mg/dL (calc) — ABNORMAL HIGH
Non-HDL Cholesterol (Calc): 127 mg/dL (calc) (ref <130)
Total CHOL/HDL Ratio: 3.5 (calc) (ref <5.0)
Triglycerides: 81 mg/dL (ref <150)

## 2018-09-04 LAB — B12 AND FOLATE PANEL
FOLATE: 10.7 ng/mL
Vitamin B-12: 503 pg/mL (ref 200–1100)

## 2018-09-04 LAB — LIPASE: Lipase: 11 U/L (ref 7–60)

## 2018-09-04 LAB — TSH: TSH: 0.94 mIU/L (ref 0.40–4.50)

## 2018-09-04 LAB — AMYLASE: Amylase: 80 U/L (ref 21–101)

## 2018-09-17 DIAGNOSIS — H25813 Combined forms of age-related cataract, bilateral: Secondary | ICD-10-CM | POA: Diagnosis not present

## 2018-09-17 DIAGNOSIS — H524 Presbyopia: Secondary | ICD-10-CM | POA: Diagnosis not present

## 2018-10-04 DIAGNOSIS — M24571 Contracture, right ankle: Secondary | ICD-10-CM | POA: Diagnosis not present

## 2018-10-04 DIAGNOSIS — M779 Enthesopathy, unspecified: Secondary | ICD-10-CM | POA: Diagnosis not present

## 2018-10-04 DIAGNOSIS — M24572 Contracture, left ankle: Secondary | ICD-10-CM | POA: Diagnosis not present

## 2018-10-04 DIAGNOSIS — M7742 Metatarsalgia, left foot: Secondary | ICD-10-CM | POA: Diagnosis not present

## 2018-10-04 DIAGNOSIS — M7741 Metatarsalgia, right foot: Secondary | ICD-10-CM | POA: Diagnosis not present

## 2018-10-11 ENCOUNTER — Encounter: Payer: Self-pay | Admitting: Family Medicine

## 2018-10-11 ENCOUNTER — Ambulatory Visit (INDEPENDENT_AMBULATORY_CARE_PROVIDER_SITE_OTHER): Payer: Medicare HMO | Admitting: Family Medicine

## 2018-10-11 VITALS — BP 160/66 | HR 57 | Ht 63.09 in | Wt 145.0 lb

## 2018-10-11 DIAGNOSIS — I10 Essential (primary) hypertension: Secondary | ICD-10-CM

## 2018-10-11 DIAGNOSIS — R110 Nausea: Secondary | ICD-10-CM | POA: Diagnosis not present

## 2018-10-11 DIAGNOSIS — H9202 Otalgia, left ear: Secondary | ICD-10-CM

## 2018-10-11 DIAGNOSIS — M542 Cervicalgia: Secondary | ICD-10-CM | POA: Diagnosis not present

## 2018-10-11 DIAGNOSIS — R490 Dysphonia: Secondary | ICD-10-CM

## 2018-10-11 MED ORDER — NEBIVOLOL HCL 20 MG PO TABS: 20.0000 mg | ORAL_TABLET | Freq: Every day | ORAL | 5 refills | Status: DC

## 2018-10-11 NOTE — Progress Notes (Signed)
Subjective:    Patient ID: Tina Medina, female    DOB: [birth year], 77 y.o.   MRN: [id number]  HPI Hypertension- Pt denies chest pain, SOB, dizziness, or heart palpitations.  Taking meds as directed w/o problems.  Denies medication side effects.    Nausea-she started taking antiacid prescription that she had from [place] and says it has helped with her nausea.  She would also like to schedule her mammogram.  Her last one that we have on file was in 2015.  But she says her nipples have been a little painful but no rash or irritation.  She like to go ahead and get her screening mammogram scheduled.  Raspy voice-she says it seems to be coming and going it is not quite as persistent as it was but is also not completely gone.  He actually referred her to ENT but she says she was never contacted to schedule an appointment.  She still having some pain on the left side of her neck mostly over the sternocleidomastoid muscle.  She is usually able to place a warm compress and that provides some pain relief and then she will usually spit up a little bit of white sputum and feel better.  Sometimes that pain does radiate up into the left ear.  She still has a lot of cerumen in that left ear but has been using the Debrox drops and says some of the wax has been gradually coming out.  He also reports that last week she had 2 nights in a row where she woke up feeling like the room was spinning experience and vertigo.  It seemed to go away completely after that.  Review of Systems  BP (!) 160/66   Pulse (!) 57   Ht 5' 3.09" (1.602 m)   Wt 145 lb (65.8 kg)   SpO2 100%   BMI 25.61 kg/m     Allergies  Allergen Reactions  . Ace Inhibitors     REACTION: cough  . Amlodipine   . Atenolol   . Benicar [Olmesartan Medoxomil] Other (See Comments)    Chest pain  . Diltiazem Other (See Comments)    Sharp head ache.   . Losartan Other (See Comments)    headache  . Metoprolol Diarrhea    Abdominal cramping.    . Micardis [Telmisartan] Other (See Comments)    Chest Pain, Back Pain.   [name] Penicillins     REACTION: rash    Past Medical History:  Diagnosis Date  . DDD (degenerative disc disease), lumbar    L spine, spinal stenosis  . Gastritis    H Pylori  . Hyperlipidemia   . Hypertension   . Lumbar degenerative disc disease 2002   with stenosis  . 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 Menopause    since age 45  . Migraine   . Osteoma    2 mm of the L lid ethmoid air cells    Past Surgical History:  Procedure Laterality Date  . BREAST SURGERY  1969   Left    Social History   Socioeconomic History  . Marital status: Widowed    Spouse name: Not on file  . Number of children: 3  . Years of education: Not on file  . Highest education level: Not on file  Occupational History  . Occupation: Retired.      Employer: [employer]    Comment: Retired  Scientific laboratory technician  . Financial  resource strain: Not on file  . Food insecurity:    Worry: Not on file    Inability: Not on file  . Transportation needs:    Medical: Not on file    Non-medical: Not on file  Tobacco Use  . Smoking status: Former Smoker    Types: Cigarettes  . Smokeless tobacco: Former Systems developer    Quit date: 12/05/1962  Substance and Sexual Activity  . Alcohol use: Yes    Comment: Occasional glass of wine  . Drug use: No  . Sexual activity: Not on file  Lifestyle  . Physical activity:    Days per week: Not on file    Minutes per session: Not on file  . Stress: Not on file  Relationships  . Social connections:    Talks on phone: Not on file    Gets together: Not on file    Attends religious service: Not on file    Active member of club or organization: Not on file    Attends meetings of clubs or organizations: Not on file    Relationship status: Not on file  . Intimate partner violence:    Fear of current or ex partner: Not on file    Emotionally abused: Not on file    Physically abused: Not  on file    Forced sexual activity: Not on file  Other Topics Concern  . Not on file  Social History Narrative   Some exercise.     Family History  Problem Relation Age of Onset  . Hyperlipidemia Mother   . Hypertension Mother   . Diabetes Mother   . Prostate cancer Father   . Hypertension Brother     Outpatient Encounter Medications as of 10/11/2018  Medication Sig  . aspirin 81 MG chewable tablet Chew 81 mg by mouth daily.    . Nebivolol HCl 20 MG TABS Take 1 tablet (20 mg total) by mouth daily.  . rosuvastatin (CRESTOR) 5 MG tablet Take 5 mg by mouth at bedtime.  . triamcinolone ointment (KENALOG) 0.5 % Apply topically at bedtime as needed. Itching ankles  . [DISCONTINUED] nebivolol (BYSTOLIC) 10 MG tablet Take 1 tablet (10 mg total) by mouth daily.  . [DISCONTINUED] telmisartan (MICARDIS) 20 MG tablet Take 1 tablet (20 mg total) by mouth daily.   No facility-administered encounter medications on file as of 10/11/2018.          Objective:   Physical Exam  Constitutional: She is oriented to person, place, and time. She appears well-developed and well-nourished.  HENT:  Head: Normocephalic and atraumatic.  Cardiovascular: Normal rate, regular rhythm and normal heart sounds.  Pulmonary/Chest: Effort normal and breath sounds normal.  Neurological: She is alert and oriented to person, place, and time.  Skin: Skin is warm and dry.  Psychiatric: She has a normal mood and affect. Her behavior is normal.        Assessment & Plan:  Hypertension-we will refill Bystolic.  She requested that the prescription be printed.  Blood pressure uncontrolled but she does have some element of whitecoat hypertension which makes it really difficult to figure out where her baseline is.  I will increase [name] to 20 mg.  New prescription sent to pharmacy.  Screening mammogram ordered.    Raspy voice-will find out why she has not been contacted by ENT so we will work on that and try get  her scheduled.  I still think she needs a formal evaluation for her vocal cords in  her throat.  Left sided neck pain-seems to be improved with warm compresses-continue current regimen.  And will have ENT address.  Nausea-improved with antacid medication though not exactly sure which when she is taking.  She could not remember the name of it.

## 2018-10-22 ENCOUNTER — Telehealth: Payer: Self-pay

## 2018-10-22 NOTE — Telephone Encounter (Signed)
Left message for a return call

## 2018-10-22 NOTE — Telephone Encounter (Signed)
OK to go back down to 10 mg.  How does she feel about adding a low dose diuretic?

## 2018-10-22 NOTE — Telephone Encounter (Signed)
Tina Medina called and states since the increase of Nebivolol to 20 mg she has noticed forgetfulness and headaches. Please advise.

## 2018-10-23 ENCOUNTER — Other Ambulatory Visit: Payer: Self-pay | Admitting: Otolaryngology

## 2018-10-23 ENCOUNTER — Ambulatory Visit (INDEPENDENT_AMBULATORY_CARE_PROVIDER_SITE_OTHER): Payer: Medicare HMO

## 2018-10-23 DIAGNOSIS — R05 Cough: Secondary | ICD-10-CM | POA: Diagnosis not present

## 2018-10-23 DIAGNOSIS — R059 Cough, unspecified: Secondary | ICD-10-CM

## 2018-10-23 DIAGNOSIS — H6122 Impacted cerumen, left ear: Secondary | ICD-10-CM | POA: Diagnosis not present

## 2018-10-23 DIAGNOSIS — R490 Dysphonia: Secondary | ICD-10-CM | POA: Diagnosis not present

## 2018-10-29 ENCOUNTER — Ambulatory Visit (INDEPENDENT_AMBULATORY_CARE_PROVIDER_SITE_OTHER): Payer: Medicare HMO | Admitting: Family Medicine

## 2018-10-29 VITALS — BP 186/67 | HR 50

## 2018-10-29 DIAGNOSIS — I10 Essential (primary) hypertension: Secondary | ICD-10-CM

## 2018-10-29 MED ORDER — HYDROCHLOROTHIAZIDE 12.5 MG PO CAPS: 12.5000 mg | ORAL_CAPSULE | Freq: Every day | ORAL | 1 refills | Status: DC

## 2018-10-29 NOTE — Progress Notes (Signed)
Pt came into clinic today for BP check. Reports she took her Rx about 0730. She did check her BP at home before coming to clinic today and it was 169/65. Her BP readings her elevated today as well, did let her sit with a cup of water. PCP wants to add HCTZ to get regime. She is OK to try. Will return for [place] BP check in 2 weeks.   Vitals:   10/29/18 1014 10/29/18 1035  BP: (!) 193/58 (!) 186/67  Pulse: (!) 52 (!) 50

## 2018-10-29 NOTE — Telephone Encounter (Signed)
Patient evaluated in office today. Diuretic added.

## 2018-10-29 NOTE — Progress Notes (Signed)
Agree with above.  Will start with 12.5 mg of HCTZ.  Will need to monitor her diastolic though.  Follow-up in 2 weeks for blood pressure check.  Beatrice Lecher, MD

## 2018-10-30 ENCOUNTER — Encounter: Payer: Self-pay | Admitting: Family Medicine

## 2018-11-13 ENCOUNTER — Ambulatory Visit (INDEPENDENT_AMBULATORY_CARE_PROVIDER_SITE_OTHER): Payer: Medicare HMO | Admitting: Family Medicine

## 2018-11-13 VITALS — BP 181/66 | HR 55 | Temp 98.6°F | Wt 148.7 lb

## 2018-11-13 DIAGNOSIS — I10 Essential (primary) hypertension: Secondary | ICD-10-CM

## 2018-11-13 MED ORDER — HYDRALAZINE HCL 10 MG PO TABS: 10.0000 mg | ORAL_TABLET | Freq: Three times a day (TID) | ORAL | 1 refills | Status: DC

## 2018-11-13 NOTE — Progress Notes (Signed)
Pt in today for BP check, todays BP was 181/66 and pulse 55. Pt reports taking bp meds today except for HCTZ. Spoke with provider, Pts will began taking Hydralazine 10 mg TID , Pt is to follow up in 2 weeks for nurse visit to recheck her BP.

## 2018-11-13 NOTE — Progress Notes (Signed)
Agree with above.  New rx sent ot pharmacy.   Beatrice Lecher, MD

## 2018-11-14 ENCOUNTER — Encounter: Payer: Self-pay | Admitting: Family Medicine

## 2018-11-14 DIAGNOSIS — H40003 Preglaucoma, unspecified, bilateral: Secondary | ICD-10-CM | POA: Diagnosis not present

## 2018-11-14 DIAGNOSIS — H527 Unspecified disorder of refraction: Secondary | ICD-10-CM | POA: Diagnosis not present

## 2018-11-14 DIAGNOSIS — H0100A Unspecified blepharitis right eye, upper and lower eyelids: Secondary | ICD-10-CM | POA: Diagnosis not present

## 2018-11-14 DIAGNOSIS — H0100B Unspecified blepharitis left eye, upper and lower eyelids: Secondary | ICD-10-CM | POA: Diagnosis not present

## 2018-11-14 DIAGNOSIS — H25813 Combined forms of age-related cataract, bilateral: Secondary | ICD-10-CM | POA: Diagnosis not present

## 2018-11-30 ENCOUNTER — Ambulatory Visit (INDEPENDENT_AMBULATORY_CARE_PROVIDER_SITE_OTHER): Payer: Medicare HMO | Admitting: Family Medicine

## 2018-11-30 VITALS — BP 178/64 | HR 51 | Ht 63.09 in | Wt 148.0 lb

## 2018-11-30 DIAGNOSIS — I10 Essential (primary) hypertension: Secondary | ICD-10-CM | POA: Diagnosis not present

## 2018-11-30 MED ORDER — VERAPAMIL HCL ER 100 MG PO CP24: 100.0000 mg | ORAL_CAPSULE | Freq: Every day | ORAL | 1 refills | Status: DC

## 2018-11-30 NOTE — Progress Notes (Signed)
Called and notified patient of medication changes. Patient verbalized understanding and an appointment has been made for patient to return for a blood pressure check. No further questions or concerns at this time.

## 2018-11-30 NOTE — Progress Notes (Signed)
Patient is here today for a blood pressure check. Patient reports taking hydrochlorothiazide 12.5 mg and nebivolol 20 mg daily. Patient states she was taking hydralazine 10 mg TID but she stopped taking it last week as the medication caused her to continuously feel "chills" all over her body. Patient also noticed the medication did not help with her blood pressure as she checks her blood pressure at least three times per day at home and the systolic readings were still in the 170's and 180's.   Patients blood pressure today was 178/64. I allowed patient to sit for 10 minutes and rechecked blood pressure manually. The manual blood pressure reading was 170/90   Patient does report feeling more stress and anxiety lately. She lost her husband in August and has not really felt the same since his death. Will route note to PCP for further evaluation. I explained to patient that we will contact her with any information or changes made by PCP. Patient verbalized understanding. No further questions or concerns at this time. Tina Medina, CMA.

## 2018-11-30 NOTE — Progress Notes (Signed)
HTN - still uncontrolled.  Will work try verapamil based on allergy list.  Will send over low dose.  F/U 2 weeks for nurse visit on new medication.

## 2018-12-03 DIAGNOSIS — H40003 Preglaucoma, unspecified, bilateral: Secondary | ICD-10-CM | POA: Diagnosis not present

## 2018-12-03 DIAGNOSIS — H25813 Combined forms of age-related cataract, bilateral: Secondary | ICD-10-CM | POA: Diagnosis not present

## 2018-12-06 ENCOUNTER — Encounter: Payer: Self-pay | Admitting: Family Medicine

## 2018-12-10 ENCOUNTER — Encounter: Payer: Self-pay | Admitting: Family Medicine

## 2018-12-10 ENCOUNTER — Ambulatory Visit (INDEPENDENT_AMBULATORY_CARE_PROVIDER_SITE_OTHER): Payer: Medicare HMO

## 2018-12-10 ENCOUNTER — Ambulatory Visit (INDEPENDENT_AMBULATORY_CARE_PROVIDER_SITE_OTHER): Payer: Medicare HMO | Admitting: Family Medicine

## 2018-12-10 VITALS — BP 142/60 | HR 65 | Ht 63.0 in | Wt 148.0 lb

## 2018-12-10 DIAGNOSIS — M25561 Pain in right knee: Secondary | ICD-10-CM | POA: Insufficient documentation

## 2018-12-10 DIAGNOSIS — R2242 Localized swelling, mass and lump, left lower limb: Secondary | ICD-10-CM

## 2018-12-10 DIAGNOSIS — M7732 Calcaneal spur, left foot: Secondary | ICD-10-CM | POA: Diagnosis not present

## 2018-12-10 DIAGNOSIS — I10 Essential (primary) hypertension: Secondary | ICD-10-CM

## 2018-12-10 NOTE — Progress Notes (Signed)
Acute Office Visit  Subjective:    Patient ID: Tina Medina, female    DOB: May 17, 1941, 78 y.o.   MRN: [id number]  Chief Complaint  Patient presents with  . Cyst    pt reports that she noticed on thursday on the arch of her L foot     HPI Patient is in today for lump on her foot. reports that she noticed on thursday on the arch of her L foot.  She says it does not bother her with walking but just noticed it on her foot.  Does not remember any trauma or injury.  She also complains of right knee pain.  Mostly over the anterior knee over the patellar tendon she says it really just bothers her when she walks if she is sitting and resting the pain goes away.  She has had problems in the past where the right knee locked.  But this is not been occurring recently.  Have an x-ray of her right knee from August 20 13 showing some spurring of the patella.  HTN -sugars are still uncontrolled.  Mostly running between 160-170.  Previously on hydralazine but says she quit taking it because she was feeling chilled all over her body and did not really notice a big difference in her blood pressures.  Did to put her on verapamil but she feels like it is causing constipation. She says it gave her headaches that were waking her up at night.  She did try taking it for about 4 days.  She actually held [name] while she was taking the verapamil.    Past Medical History:  Diagnosis Date  . DDD (degenerative disc disease), lumbar    L spine, spinal stenosis  . Gastritis    H Pylori  . Hyperlipidemia   . Hypertension   . Lumbar degenerative disc disease 2002   with stenosis  . 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 Menopause    since age 75  . Migraine   . Osteoma    2 mm of the L lid ethmoid air cells    Past Surgical History:  Procedure Laterality Date  . BREAST SURGERY  1969   Left    Family History  Problem Relation Age of Onset  . Hyperlipidemia Mother   .  Hypertension Mother   . Diabetes Mother   . Prostate cancer Father   . Hypertension Brother     Social History   Socioeconomic History  . Marital status: Widowed    Spouse name: Not on file  . Number of children: 3  . Years of education: Not on file  . Highest education level: Not on file  Occupational History  . Occupation: Retired.      Employer: [employer]    Comment: Retired  Scientific laboratory technician  . Financial resource strain: Not on file  . Food insecurity:    Worry: Not on file    Inability: Not on file  . Transportation needs:    Medical: Not on file    Non-medical: Not on file  Tobacco Use  . Smoking status: Former Smoker    Types: Cigarettes  . Smokeless tobacco: Former Systems developer    Quit date: 12/05/1962  Substance and Sexual Activity  . Alcohol use: Yes    Comment: Occasional glass of wine  . Drug use: No  . Sexual activity: Not on file  Lifestyle  . Physical activity:    Days per  week: Not on file    Minutes per session: Not on file  . Stress: Not on file  Relationships  . Social connections:    Talks on phone: Not on file    Gets together: Not on file    Attends religious service: Not on file    Active member of club or organization: Not on file    Attends meetings of clubs or organizations: Not on file    Relationship status: Not on file  . Intimate partner violence:    Fear of current or ex partner: Not on file    Emotionally abused: Not on file    Physically abused: Not on file    Forced sexual activity: Not on file  Other Topics Concern  . Not on file  Social History Narrative   Some exercise.     Outpatient Medications Prior to Visit  Medication Sig Dispense Refill  . aspirin 81 MG chewable tablet Chew 81 mg by mouth daily.      . hydrochlorothiazide (MICROZIDE) 12.5 MG capsule Take 1 capsule (12.5 mg total) by mouth daily. 30 capsule 1  . Nebivolol HCl 20 MG TABS Take 1 tablet (20 mg total) by mouth daily. 30 tablet 5  . rosuvastatin (CRESTOR) 5 MG  tablet Take 5 mg by mouth at bedtime.    . triamcinolone ointment (KENALOG) 0.5 % Apply topically at bedtime as needed. Itching ankles 45 g 1  . verapamil (VERELAN) 100 MG 24 hr capsule Take 1 capsule (100 mg total) by mouth at bedtime. 30 capsule 1  . hydrALAZINE (APRESOLINE) 10 MG tablet Take 1 tablet (10 mg total) by mouth 3 (three) times daily. (Patient not taking: Reported on 11/30/2018) 90 tablet 1   No facility-administered medications prior to visit.     Allergies  Allergen Reactions  . Ace Inhibitors     REACTION: cough  . Atenolol   . Benicar [Olmesartan Medoxomil] Other (See Comments)    Chest pain  . Diltiazem Other (See Comments)    Sharp head ache.   . Hydralazine Other (See Comments)    "Chills"   . Losartan Other (See Comments)    headache  . Metoprolol Diarrhea    Abdominal cramping.   . Micardis [Telmisartan] Other (See Comments)    Chest Pain, Back Pain.   [name] Penicillins     REACTION: rash  . Verapamil Other (See Comments)    Headaches and constipation  . Amlodipine Rash    And Headaches    ROS     Objective:    Physical Exam  Constitutional: She is oriented to person, place, and time. She appears well-developed and well-nourished.  HENT:  Head: Normocephalic and atraumatic.  Eyes: Conjunctivae and EOM are normal.  Cardiovascular: Normal rate, regular rhythm and normal heart sounds.  Pulmonary/Chest: Effort normal and breath sounds normal.  Musculoskeletal:     Comments: Right knee is nontender over the patella or over the medial and lateral joint lines.  Nontender over the patellar tendon but the distal tendon area is where she points for where she often has discomfort.  No significant swelling on exam today.  Normal flexion and extension.  Strength is 5 out of 5 with flexion and extension at the knee and ankle.  Neurological: She is alert and oriented to person, place, and time.  Skin: Skin is warm and dry. No pallor.  In the arch of the left foot  she does have a palpable nodule approximately a centimeter in  size.  No erythema or surrounding rash or wound or change in skin color.  Psychiatric: She has a normal mood and affect. Her behavior is normal.  Vitals reviewed.   BP (!) 142/60   Pulse 65   Ht 5\' 3"  (1.6 m)   Wt 148 lb (67.1 kg)   SpO2 99%   BMI 26.22 kg/m  Wt Readings from Last 3 Encounters:  12/10/18 148 lb (67.1 kg)  11/30/18 148 lb (67.1 kg)  11/13/18 148 lb 11.2 oz (67.4 kg)    There are no preventive care reminders to display for this patient.  There are no preventive care reminders to display for this patient.   Lab Results  Component Value Date   TSH 0.94 09/03/2018   Lab Results  Component Value Date   WBC 6.6 09/03/2018   HGB 12.5 09/03/2018   HCT 37.2 09/03/2018   MCV 94.2 09/03/2018   PLT 264 09/03/2018   Lab Results  Component Value Date   NA 144 09/03/2018   K 4.4 09/03/2018   CO2 30 09/03/2018   GLUCOSE 90 09/03/2018   BUN 10 09/03/2018   CREATININE 0.93 09/03/2018   BILITOT 0.5 09/03/2018   ALKPHOS 66 01/30/2015   AST 16 09/03/2018   ALT 10 09/03/2018   PROT 6.4 09/03/2018   ALBUMIN 4.1 01/30/2015   CALCIUM 9.3 09/03/2018   Lab Results  Component Value Date   CHOL 177 09/03/2018   Lab Results  Component Value Date   HDL 50 (L) 09/03/2018   Lab Results  Component Value Date   LDLCALC 110 (H) 09/03/2018   Lab Results  Component Value Date   TRIG 81 09/03/2018   Lab Results  Component Value Date   CHOLHDL 3.5 09/03/2018   Lab Results  Component Value Date   HGBA1C 5.4 09/03/2018        Assessment & Plan:   Problem List Items Addressed This Visit      Cardiovascular and Mediastinum   ESSENTIAL HYPERTENSION, BENIGN - Primary    Still uncontrolled.  She is going to go ahead and restart the nebivolol and continue with the hydrochlorothiazide.  Will monitor I may need to really consider consultation with cardiology to help figure out what we can put her on that  will help better control her blood pressure.        Other   Acute pain of right knee    Other Visit Diagnoses    Foot mass, left       Relevant Orders   DG Foot 2 Views Left (Completed)     Right knee-suspect patellar tendinitis based on area of discomfort and pain.  Handout given with exercises to do on her own at home.  If not improving then would actually like to get her in with 1 of our sports med providers for further work-up.  Mass in arch of left foot-we will start with plain film x-ray.  If negative then will get her in again with sports medicine so they can evaluate the lesion with ultrasound to see if it may just be a cyst.  If it is causing pain and problems then consider removal.   No orders of the defined types were placed in this encounter.    Beatrice Lecher, MD

## 2018-12-10 NOTE — Assessment & Plan Note (Signed)
Still uncontrolled.  She is going to go ahead and restart the nebivolol and continue with the hydrochlorothiazide.  Will monitor I may need to really consider consultation with cardiology to help figure out what we can put her on that will help better control her blood pressure.

## 2018-12-12 ENCOUNTER — Encounter: Payer: Self-pay | Admitting: Family Medicine

## 2018-12-14 ENCOUNTER — Ambulatory Visit: Payer: Medicare HMO

## 2018-12-20 DIAGNOSIS — Z7982 Long term (current) use of aspirin: Secondary | ICD-10-CM | POA: Diagnosis not present

## 2018-12-20 DIAGNOSIS — J449 Chronic obstructive pulmonary disease, unspecified: Secondary | ICD-10-CM | POA: Diagnosis not present

## 2018-12-20 DIAGNOSIS — H527 Unspecified disorder of refraction: Secondary | ICD-10-CM | POA: Diagnosis not present

## 2018-12-20 DIAGNOSIS — H25811 Combined forms of age-related cataract, right eye: Secondary | ICD-10-CM | POA: Diagnosis not present

## 2018-12-20 DIAGNOSIS — I10 Essential (primary) hypertension: Secondary | ICD-10-CM | POA: Diagnosis not present

## 2018-12-20 DIAGNOSIS — H25813 Combined forms of age-related cataract, bilateral: Secondary | ICD-10-CM | POA: Diagnosis not present

## 2018-12-20 DIAGNOSIS — E785 Hyperlipidemia, unspecified: Secondary | ICD-10-CM | POA: Diagnosis not present

## 2018-12-20 DIAGNOSIS — H0100B Unspecified blepharitis left eye, upper and lower eyelids: Secondary | ICD-10-CM | POA: Diagnosis not present

## 2018-12-20 DIAGNOSIS — K219 Gastro-esophageal reflux disease without esophagitis: Secondary | ICD-10-CM | POA: Diagnosis not present

## 2018-12-20 DIAGNOSIS — H40003 Preglaucoma, unspecified, bilateral: Secondary | ICD-10-CM | POA: Diagnosis not present

## 2018-12-20 DIAGNOSIS — H278 Other specified disorders of lens: Secondary | ICD-10-CM | POA: Insufficient documentation

## 2018-12-21 ENCOUNTER — Encounter: Payer: Self-pay | Admitting: Family Medicine

## 2018-12-21 DIAGNOSIS — H0100B Unspecified blepharitis left eye, upper and lower eyelids: Secondary | ICD-10-CM | POA: Diagnosis not present

## 2018-12-21 DIAGNOSIS — H527 Unspecified disorder of refraction: Secondary | ICD-10-CM | POA: Diagnosis not present

## 2018-12-21 DIAGNOSIS — H40003 Preglaucoma, unspecified, bilateral: Secondary | ICD-10-CM | POA: Diagnosis not present

## 2018-12-21 DIAGNOSIS — Z961 Presence of intraocular lens: Secondary | ICD-10-CM | POA: Diagnosis not present

## 2018-12-21 DIAGNOSIS — H0100A Unspecified blepharitis right eye, upper and lower eyelids: Secondary | ICD-10-CM | POA: Diagnosis not present

## 2018-12-21 DIAGNOSIS — H25812 Combined forms of age-related cataract, left eye: Secondary | ICD-10-CM | POA: Diagnosis not present

## 2019-01-01 DIAGNOSIS — J449 Chronic obstructive pulmonary disease, unspecified: Secondary | ICD-10-CM | POA: Diagnosis not present

## 2019-01-01 DIAGNOSIS — I10 Essential (primary) hypertension: Secondary | ICD-10-CM | POA: Diagnosis not present

## 2019-01-01 DIAGNOSIS — Z87891 Personal history of nicotine dependence: Secondary | ICD-10-CM | POA: Diagnosis not present

## 2019-01-01 DIAGNOSIS — H25812 Combined forms of age-related cataract, left eye: Secondary | ICD-10-CM | POA: Diagnosis not present

## 2019-01-01 DIAGNOSIS — E785 Hyperlipidemia, unspecified: Secondary | ICD-10-CM | POA: Diagnosis not present

## 2019-01-02 DIAGNOSIS — H527 Unspecified disorder of refraction: Secondary | ICD-10-CM | POA: Diagnosis not present

## 2019-01-02 DIAGNOSIS — Z961 Presence of intraocular lens: Secondary | ICD-10-CM | POA: Diagnosis not present

## 2019-01-02 DIAGNOSIS — H40003 Preglaucoma, unspecified, bilateral: Secondary | ICD-10-CM | POA: Diagnosis not present

## 2019-01-02 DIAGNOSIS — H0100B Unspecified blepharitis left eye, upper and lower eyelids: Secondary | ICD-10-CM | POA: Diagnosis not present

## 2019-01-02 DIAGNOSIS — H0100A Unspecified blepharitis right eye, upper and lower eyelids: Secondary | ICD-10-CM | POA: Diagnosis not present

## 2019-01-08 DIAGNOSIS — H25811 Combined forms of age-related cataract, right eye: Secondary | ICD-10-CM | POA: Diagnosis not present

## 2019-01-08 DIAGNOSIS — H25812 Combined forms of age-related cataract, left eye: Secondary | ICD-10-CM | POA: Diagnosis not present

## 2019-02-11 ENCOUNTER — Encounter: Payer: Self-pay | Admitting: Family Medicine

## 2019-02-11 ENCOUNTER — Ambulatory Visit (INDEPENDENT_AMBULATORY_CARE_PROVIDER_SITE_OTHER): Payer: Medicare HMO | Admitting: Family Medicine

## 2019-02-11 VITALS — BP 168/70 | HR 55 | Ht 63.0 in | Wt 154.0 lb

## 2019-02-11 DIAGNOSIS — I10 Essential (primary) hypertension: Secondary | ICD-10-CM

## 2019-02-11 DIAGNOSIS — N888 Other specified noninflammatory disorders of cervix uteri: Secondary | ICD-10-CM

## 2019-02-11 DIAGNOSIS — E785 Hyperlipidemia, unspecified: Secondary | ICD-10-CM | POA: Diagnosis not present

## 2019-02-11 MED ORDER — HYDROCHLOROTHIAZIDE 12.5 MG PO CAPS: 12.5000 mg | ORAL_CAPSULE | Freq: Every day | ORAL | 1 refills | Status: DC

## 2019-02-11 NOTE — Progress Notes (Signed)
Subjective:    CC: Bp f/u  HPI:  Hypertension- Pt denies chest pain, SOB, dizziness, or heart palpitations.  Taking meds as directed w/o problems.  Denies medication side effects.  She restarted the nebivolol.  So far she has been tolerating it well without any significant side effects.  She reports that her home blood pressures have been running 170/80 in general. She hasn't been taking her HCTZ consistantly.   She does report that she recently had some left-sided chest pain.  She noticed it more at rest she thinks she may have overdone it working out in her yard.  She denies any radiation numbness or paresthesias.  She has a knot under the left side of her neck.  She says is not really cause any pain or problems.  And to make sure that was okay.  She denies any fever sweats or chills.  Her lipidemia-she is taking her Crestor regularly without any side effects or myalgias.  Past medical history, Surgical history, Family history not pertinant except as noted below, Social history, Allergies, and medications have been entered into the medical record, reviewed, and corrections made.   Review of Systems: No fevers, chills, night sweats, weight loss, chest pain, or shortness of breath.   Objective:    General: Well Developed, well nourished, and in no acute distress.  Neuro: Alert and oriented x3, extra-ocular muscles intact, sensation grossly intact.  HEENT: Normocephalic, atraumatic.  No significant cervical lymphadenopathy.  The bulge that she is pointing out is the carotid and I am able to palpate a pulse over it. Skin: Warm and dry, no rashes.  No carotid bruits. Cardiac: Regular rate and rhythm, no murmurs rubs or gallops, no lower extremity edema.  Respiratory: Clear to auscultation bilaterally. Not using accessory muscles, speaking in full sentences.   Impression and Recommendations:   HTN -uncontrolled.  [name] that I really want to get her blood pressure under 140 if at all  possible to reduce her risk for heart attack and stroke.  [name] possibly going up to the Bystolic to 40 mg which is the max dose or even to 30 mg.  She wants to check with her insurance first.  We also discussed taking her HCTZ more consistently.  This would help.   Hyperlipidemia-continue with Crestor daily.  Knot under left side of neck-discussed based on exam is consistent with her carotid.  It just seems to be more prominent.  I did not hear any bruits on exam.  Certainly if she starts develop any pain if it is getting larger and please let me know.

## 2019-02-11 NOTE — Patient Instructions (Signed)
Try taking the HCTZ everyday.

## 2019-02-12 ENCOUNTER — Other Ambulatory Visit: Payer: Self-pay | Admitting: Family Medicine

## 2019-02-12 ENCOUNTER — Telehealth: Payer: Self-pay | Admitting: Family Medicine

## 2019-02-12 DIAGNOSIS — Z1231 Encounter for screening mammogram for malignant neoplasm of breast: Secondary | ICD-10-CM

## 2019-02-12 DIAGNOSIS — I10 Essential (primary) hypertension: Secondary | ICD-10-CM | POA: Diagnosis not present

## 2019-02-12 LAB — BASIC METABOLIC PANEL WITH GFR
BUN / CREAT RATIO: 12 (calc) (ref 6–22)
BUN: 12 mg/dL (ref 7–25)
CO2: 29 mmol/L (ref 20–32)
CREATININE: 1.03 mg/dL — AB (ref 0.60–0.88)
Calcium: 9.9 mg/dL (ref 8.6–10.4)
Chloride: 108 mmol/L (ref 98–110)
GFR, EST AFRICAN AMERICAN: 59 mL/min/{1.73_m2} — AB (ref >=60)
GFR, Est Non African American: 51 mL/min/{1.73_m2} — ABNORMAL LOW (ref >=60)
Glucose, Bld: 88 mg/dL (ref 65–99)
POTASSIUM: 4.8 mmol/L (ref 3.5–5.3)
SODIUM: 143 mmol/L (ref 135–146)

## 2019-02-12 NOTE — Telephone Encounter (Signed)
PT wanted to leave a message, "bystolic's 40mg  is the same price as the 20mg . Still wants to keep at 20mg ."

## 2019-02-12 NOTE — Telephone Encounter (Signed)
Please see phone note.Marland KitchenMarland KitchenElouise Munroe, [place]

## 2019-02-13 NOTE — Progress Notes (Signed)
All labs are normal. 

## 2019-02-20 ENCOUNTER — Other Ambulatory Visit: Payer: Self-pay

## 2019-02-20 ENCOUNTER — Ambulatory Visit (INDEPENDENT_AMBULATORY_CARE_PROVIDER_SITE_OTHER): Payer: Medicare HMO

## 2019-02-20 DIAGNOSIS — Z1231 Encounter for screening mammogram for malignant neoplasm of breast: Secondary | ICD-10-CM

## 2019-02-25 ENCOUNTER — Encounter: Payer: Self-pay | Admitting: Family Medicine

## 2019-02-25 ENCOUNTER — Ambulatory Visit (INDEPENDENT_AMBULATORY_CARE_PROVIDER_SITE_OTHER): Payer: Medicare HMO | Admitting: Family Medicine

## 2019-02-25 ENCOUNTER — Other Ambulatory Visit: Payer: Self-pay

## 2019-02-25 VITALS — BP 168/74 | HR 51 | Ht 63.0 in | Wt 153.0 lb

## 2019-02-25 DIAGNOSIS — R140 Abdominal distension (gaseous): Secondary | ICD-10-CM

## 2019-02-25 DIAGNOSIS — I10 Essential (primary) hypertension: Secondary | ICD-10-CM | POA: Diagnosis not present

## 2019-02-25 MED ORDER — NEBIVOLOL HCL 20 MG PO TABS: 20.0000 mg | ORAL_TABLET | Freq: Two times a day (BID) | ORAL | 5 refills | Status: DC

## 2019-02-25 NOTE — Progress Notes (Signed)
Subjective:    CC:   HPI:  Hypertension- Pt denies chest pain, SOB, dizziness, or heart palpitations.  Taking meds as directed w/o problems.  Denies medication side effects.  She hasn't been taking the hctz consistantly bc she has been working in the yard.  Wanted to let me know to that she is also been excessively gassy firm couple of months.  And about a month ago she decided to take a probiotic and she has been taking that pretty regularly but has not noticed a big difference.  She denies any major change in bowels.  She denies any constipation or diarrhea.  She denies any blood in the stool.  Past medical history, Surgical history, Family history not pertinant except as noted below, Social history, Allergies, and medications have been entered into the medical record, reviewed, and corrections made.   Review of Systems: No fevers, chills, night sweats, weight loss, chest pain, or shortness of breath.   Objective:    General: Well Developed, well nourished, and in no acute distress.  Neuro: Alert and oriented x3, extra-ocular muscles intact, sensation grossly intact.  HEENT: Normocephalic, atraumatic  Skin: Warm and dry, no rashes. Cardiac: Regular rate and rhythm, no murmurs rubs or gallops, no lower extremity edema.  Respiratory: Clear to auscultation bilaterally. Not using accessory muscles, speaking in full sentences.   Impression and Recommendations:    HTN -we previously discussed options.  Since it is difficult for her to take the HCTZ regularly because it does cause significant increased urinary frequency we will try increasing [name] instead.  She did check with her health insurance and there is no major difference in co-pay.  Gas/bloating.  Reassurance.  May want to reduce her dairy intake.  Also track diet to see if there may be something that she is eating that is causing it or making it worse.

## 2019-02-25 NOTE — Patient Instructions (Signed)
Please record your blood pressure at home for a few days before the office visit and the day of the office visit.  Also please check any other vitals that you were able to obtain at home including a weight and temperature.

## 2019-03-01 ENCOUNTER — Encounter: Payer: Self-pay | Admitting: Sports Medicine

## 2019-03-01 ENCOUNTER — Ambulatory Visit (INDEPENDENT_AMBULATORY_CARE_PROVIDER_SITE_OTHER): Payer: Medicare HMO | Admitting: Sports Medicine

## 2019-03-01 ENCOUNTER — Other Ambulatory Visit: Payer: Self-pay

## 2019-03-01 DIAGNOSIS — M722 Plantar fascial fibromatosis: Secondary | ICD-10-CM | POA: Diagnosis not present

## 2019-03-01 NOTE — Progress Notes (Signed)
Subjective:    CC: Left foot  HPI: For the past several months this pleasant 78 year old female has noted a minimally and intermittently tender lump on the plantar aspect of her left foot, somewhat distal to the heel.  Symptoms are mild, persistent.  Localized without radiation.  She does not walk barefoot but she does tend to wear unsupportive shoes most of the time.  I reviewed the past medical history, family history, social history, surgical history, and allergies today and no changes were needed.  Please see the problem list section below in epic for further details.  Past Medical History: Past Medical History:  Diagnosis Date  . DDD (degenerative disc disease), lumbar    L spine, spinal stenosis  . Gastritis    H Pylori  . Hyperlipidemia   . Hypertension   . Lumbar degenerative disc disease 2002   with stenosis  . 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 Menopause    since age 71  . Migraine   . Osteoma    2 mm of the L lid ethmoid air cells   Past Surgical History: Past Surgical History:  Procedure Laterality Date  . BREAST SURGERY  1969   Left   Social History: Social History   Socioeconomic History  . Marital status: Widowed    Spouse name: Not on file  . Number of children: 3  . Years of education: Not on file  . Highest education level: Not on file  Occupational History  . Occupation: Retired.      Employer: [employer]    Comment: Retired  Scientific laboratory technician  . Financial resource strain: Not on file  . Food insecurity:    Worry: Not on file    Inability: Not on file  . Transportation needs:    Medical: Not on file    Non-medical: Not on file  Tobacco Use  . Smoking status: Former Smoker    Types: Cigarettes  . Smokeless tobacco: Former Systems developer    Quit date: 12/05/1962  Substance and Sexual Activity  . Alcohol use: Yes    Comment: Occasional glass of wine  . Drug use: No  . Sexual activity: Not on file  Lifestyle  . Physical activity:     Days per week: Not on file    Minutes per session: Not on file  . Stress: Not on file  Relationships  . Social connections:    Talks on phone: Not on file    Gets together: Not on file    Attends religious service: Not on file    Active member of club or organization: Not on file    Attends meetings of clubs or organizations: Not on file    Relationship status: Not on file  Other Topics Concern  . Not on file  Social History Narrative   Some exercise.    Family History: Family History  Problem Relation Age of Onset  . Hyperlipidemia Mother   . Hypertension Mother   . Diabetes Mother   . Prostate cancer Father   . Hypertension Brother    Allergies: Allergies  Allergen Reactions  . Ace Inhibitors     REACTION: cough  . Atenolol   . Benicar [Olmesartan Medoxomil] Other (See Comments)    Chest pain  . Diltiazem Other (See Comments)    Sharp head ache.   . Hydralazine Other (See Comments)    "Chills"   . Losartan Other (See Comments)    headache  .  Metoprolol Diarrhea    Abdominal cramping.   . Micardis [Telmisartan] Other (See Comments)    Chest Pain, Back Pain.   [name] Penicillins     REACTION: rash  . Verapamil Other (See Comments)    Headaches and constipation  . Amlodipine Rash    And Headaches   Medications: See med rec.  Review of Systems: No fevers, chills, night sweats, weight loss, chest pain, or shortness of breath.   Objective:    General: Well Developed, well nourished, and in no acute distress.  Neuro: Alert and oriented x3, extra-ocular muscles intact, sensation grossly intact.  HEENT: Normocephalic, atraumatic, pupils equal round reactive to light, neck supple, no masses, no lymphadenopathy, thyroid nonpalpable.  Skin: Warm and dry, no rashes. Cardiac: Regular rate and rhythm, no murmurs rubs or gallops, no lower extremity edema.  Respiratory: Clear to auscultation bilaterally. Not using accessory muscles, speaking in full sentences. Left foot:  No visible erythema or swelling. Range of motion is full in all directions. Strength is 5/5 in all directions. No hallux valgus. No pes cavus or pes planus. No abnormal callus noted. No pain over the navicular prominence, or base of fifth metatarsal. No tenderness to palpation of the calcaneal insertion of plantar fascia. She does have palpable plantar fascial fibromatosis, nontender. No pain at the Achilles insertion. No pain over the calcaneal bursa. No pain of the retrocalcaneal bursa. No tenderness to palpation over the tarsals, metatarsals, or phalanges. No hallux rigidus or limitus. No tenderness palpation over interphalangeal joints. No pain with compression of the metatarsal heads. Neurovascularly intact distally.  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 Avoid barefoot walking, referral for custom molded cushioned orthotics. She needs supportive shoes. Return to see me after 1 month in the orthotics.   ___________________________________________ Gwen Her. Dianah Field, M.D., ABFM., CAQSM. Primary Care and Sports Medicine [facility]  Adjunct Professor of [employer] [facility] [employer] [facility] [employer]

## 2019-03-01 NOTE — Assessment & Plan Note (Signed)
Only minimally tender. Avoid barefoot walking, referral for custom molded cushioned orthotics. She needs supportive shoes. Return to see me after 1 month in the orthotics.

## 2019-03-11 ENCOUNTER — Encounter: Payer: Self-pay | Admitting: Family Medicine

## 2019-03-11 ENCOUNTER — Ambulatory Visit (INDEPENDENT_AMBULATORY_CARE_PROVIDER_SITE_OTHER): Payer: Medicare HMO | Admitting: Family Medicine

## 2019-03-11 VITALS — BP 153/63 | HR 56

## 2019-03-11 DIAGNOSIS — I10 Essential (primary) hypertension: Secondary | ICD-10-CM | POA: Diagnosis not present

## 2019-03-11 DIAGNOSIS — N644 Mastodynia: Secondary | ICD-10-CM

## 2019-03-11 DIAGNOSIS — L84 Corns and callosities: Secondary | ICD-10-CM | POA: Diagnosis not present

## 2019-03-11 NOTE — Progress Notes (Signed)
Pt reports that they didn't have the 40 mg tablet at [facility] but they will have this today and she will p/u today.  3/30:144/74 p:57 4/1: 175/80 p:56 4/1: 168/69 p:52 4/3: 183/77 p:53 4/4: 164/70 p:54 4/5: 153/63 p:56 (took 30 mg, and did yard work)  Pt advised to take bp prior to tele visit w/pcp.Elouise Munroe, [place]

## 2019-03-11 NOTE — Progress Notes (Signed)
Virtual Visit via Telephone Note  I connected with Tina Medina on 03/11/19 at  8:50 AM EDT by telephone and verified that I am speaking with the correct person using two identifiers.   I discussed the limitations, risks, security and privacy concerns of performing an evaluation and management service by telephone and the availability of in person appointments. I also discussed with the patient that there may be a patient responsible charge related to this service. The patient expressed understanding and agreed to proceed.   Patient was at home for the office visit and the provider was in the office at med center [place].  Subjective:    CC: F/U BP  HPI:  14 female with difficult to control hypertension follows up today.   When I last saw her blood pressure was still quite elevated with a systolic in the 702O.  She had not been taking her HCTZ regularly because she been out working in the yard and it causes significant increased urinary frequency.  We did also discuss possibly switching her to [facility] and she did have a chance to check with her insurance to see that it is covered under her plan. She has been working out in yard.    Still having left breast pain.  She has been having normal mammogram. She does drink 2 cups of coffee a day.  She said she was told that it might just be scar tissue.  She did let me know that her left foot is doing okay overall.  She saw Dr. Dianah Field 1 of our sports medicine docs and was diagnosed with plantar fascial fibromatosis.  She says that as long as she is careful it seems to be doing okay.  She says right now she has a callus on her right foot that is more painful than her left foot.  She has been using her pumice stone and has been using some callus pads.  Past medical history, Surgical history, Family history not pertinant except as noted below, Social history, Allergies, and medications have been entered into the medical record, reviewed, and  corrections made.   Review of Systems: No fevers, chills, night sweats, weight loss, chest pain, or shortness of breath.   Objective:    General: Speaking clearly in complete sentences without any shortness of breath.  Alert and oriented x3.  Normal judgment. No apparent acute distress.    Impression and Recommendations:    HTN - she is going to try to pick up the 40mg  of the nebivolol.  Hopefully we can get her blood pressure in the 150s which would be my goal for her.  Continue to stay active she actually really does great with this and has a normal BMI.  Right foot callus-just recommend use her pumice stone in the shower nightly and continue with the pads.  Left breast pain-again likely from scar tissue.  She just had a normal mammogram and had a normal mammogram last year.  Also discussed making sure that she is getting good support and trying to limit caffeine intake.  Gave her reassurance. .   I discussed the assessment and treatment plan with the patient. The patient was provided an opportunity to ask questions and all were answered. The patient agreed with the plan and demonstrated an understanding of the instructions.   The patient was advised to call back or seek an in-person evaluation if the symptoms worsen or if the condition fails to improve as anticipated.   Beatrice Lecher, MD

## 2019-03-27 ENCOUNTER — Telehealth: Payer: Self-pay | Admitting: Family Medicine

## 2019-03-27 NOTE — Telephone Encounter (Signed)
Pt called clinic stating she has noticed 3 times since January when she cleans her ears there is some blood on the washcloth. Denies seeing it anywhere else (pillowcase, clothing, etc). Reports no pain and no loss of hearing. Denies dizziness, headache. Scheduled for OV eval with PCP for Friday this week.

## 2019-03-28 NOTE — Telephone Encounter (Signed)
Ok, thank you

## 2019-03-29 ENCOUNTER — Encounter: Payer: Self-pay | Admitting: Family Medicine

## 2019-03-29 ENCOUNTER — Ambulatory Visit (INDEPENDENT_AMBULATORY_CARE_PROVIDER_SITE_OTHER): Payer: Medicare HMO | Admitting: Family Medicine

## 2019-03-29 VITALS — BP 168/66 | HR 59 | Ht 63.0 in | Wt 154.0 lb

## 2019-03-29 DIAGNOSIS — F40240 Claustrophobia: Secondary | ICD-10-CM

## 2019-03-29 DIAGNOSIS — R29898 Other symptoms and signs involving the musculoskeletal system: Secondary | ICD-10-CM | POA: Diagnosis not present

## 2019-03-29 DIAGNOSIS — I10 Essential (primary) hypertension: Secondary | ICD-10-CM

## 2019-03-29 DIAGNOSIS — R937 Abnormal findings on diagnostic imaging of other parts of musculoskeletal system: Secondary | ICD-10-CM | POA: Diagnosis not present

## 2019-03-29 DIAGNOSIS — R202 Paresthesia of skin: Secondary | ICD-10-CM | POA: Diagnosis not present

## 2019-03-29 DIAGNOSIS — H9222 Otorrhagia, left ear: Secondary | ICD-10-CM | POA: Diagnosis not present

## 2019-03-29 MED ORDER — CLONAZEPAM 0.5 MG PO TBDP
0.5000 mg | ORAL_TABLET | Freq: Once | ORAL | Status: AC
  Administered 2019-03-29: 0.5 mg via ORAL

## 2019-03-29 MED ORDER — MUPIROCIN 2 % EX OINT: TOPICAL_OINTMENT | CUTANEOUS | 0 refills | Status: DC

## 2019-03-29 NOTE — Progress Notes (Signed)
Acute Office Visit  Subjective:    Patient ID: Tina Medina, female    DOB: [birth year], 78 y.o.   MRN: [id number]  Chief Complaint  Patient presents with  . Ear Pain    L ear noticed some blood she reports that this happens from time to time but has become more frequent    HPI Patient is in today for planes of seeing blood a few times while cleaning and wiping her ear.  She denies any trauma or injury.  She has not seen it on her pillow when she wakes up in the morning.  Hypertension- Pt denies chest pain, SOB, dizziness, or heart palpitations.  Taking meds as directed w/o problems.  Denies medication side effects.  We had increased the nebivolol to 40 mg.  She is taking 20 mg twice a day.  She says she is been doing okay on it overall though she has noticed that her pulse has been in the 50s.  She did want let me know that a couple nights ago though while she was sitting at the table had just taken her medications she felt like her left hand was drawing and cramping and then all of a sudden it went numb and she was unable to use her first finger.  She says it lasted for a few minutes and she just kept rubbing at it eventually she started to get the feeling back and it felt better.  No other speech or hearing changes.  She said she is never had anything like this happen before.  She was just sitting at the table as she always does so there was nothing unusual about her position with no pressure on her shoulder or neck to cause this.  She has been mowing her yard and says she denies any chest pain.  She says the first time she mowed her yard this year she actually started to feel little dizzy and lightheaded and felt like her heart was pounding so since then she has slowed down and taken breaks in between.    Past Medical History:  Diagnosis Date  . DDD (degenerative disc disease), lumbar    L spine, spinal stenosis  . Gastritis    H Pylori  . Hyperlipidemia   . Hypertension   .  Lumbar degenerative disc disease 2002   with stenosis  . 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 Menopause    since age 67  . Migraine   . Osteoma    2 mm of the L lid ethmoid air cells    Past Surgical History:  Procedure Laterality Date  . BREAST SURGERY  1969   Left    Family History  Problem Relation Age of Onset  . Hyperlipidemia Mother   . Hypertension Mother   . Diabetes Mother   . Prostate cancer Father   . Hypertension Brother     Social History   Socioeconomic History  . Marital status: Widowed    Spouse name: Not on file  . Number of children: 3  . Years of education: Not on file  . Highest education level: Not on file  Occupational History  . Occupation: Retired.      Employer: [employer]    Comment: Retired  Scientific laboratory technician  . Financial resource strain: Not on file  . Food insecurity:    Worry: Not on file    Inability: Not on file  . Transportation needs:  Medical: Not on file    Non-medical: Not on file  Tobacco Use  . Smoking status: Former Smoker    Types: Cigarettes  . Smokeless tobacco: Former Systems developer    Quit date: 12/05/1962  Substance and Sexual Activity  . Alcohol use: Yes    Comment: Occasional glass of wine  . Drug use: No  . Sexual activity: Not on file  Lifestyle  . Physical activity:    Days per week: Not on file    Minutes per session: Not on file  . Stress: Not on file  Relationships  . Social connections:    Talks on phone: Not on file    Gets together: Not on file    Attends religious service: Not on file    Active member of club or organization: Not on file    Attends meetings of clubs or organizations: Not on file    Relationship status: Not on file  . Intimate partner violence:    Fear of current or ex partner: Not on file    Emotionally abused: Not on file    Physically abused: Not on file    Forced sexual activity: Not on file  Other Topics Concern  . Not on file  Social History Narrative    Some exercise.     Outpatient Medications Prior to Visit  Medication Sig Dispense Refill  . aspirin 81 MG chewable tablet Chew 81 mg by mouth daily.      . hydrochlorothiazide (MICROZIDE) 12.5 MG capsule Take 1 capsule (12.5 mg total) by mouth daily. 90 capsule 1  . ketorolac (ACULAR) 0.5 % ophthalmic solution Place 1 drop into both eyes 4 (four) times daily.    . Nebivolol HCl 20 MG TABS Take 1 tablet (20 mg total) by mouth 2 (two) times daily. 60 tablet 5  . rosuvastatin (CRESTOR) 5 MG tablet Take 5 mg by mouth at bedtime.    . triamcinolone ointment (KENALOG) 0.5 % Apply topically at bedtime as needed. Itching ankles 45 g 1  . verapamil (VERELAN) 100 MG 24 hr capsule Take 1 capsule (100 mg total) by mouth at bedtime. 30 capsule 1   No facility-administered medications prior to visit.     Allergies  Allergen Reactions  . Ace Inhibitors     REACTION: cough Other reaction(s): Abdominal Pain, Chest Pain Patient develops cough, headaches  . Atenolol   . Benicar [Olmesartan Medoxomil] Other (See Comments)    Chest pain  . Diltiazem Other (See Comments)    Sharp head ache.   . Hydralazine Other (See Comments)    "Chills"   . Losartan Other (See Comments)    headache  . Metoprolol Diarrhea    Abdominal cramping.   . Micardis [Telmisartan] Other (See Comments)    Chest Pain, Back Pain.   [name] Penicillins     REACTION: rash  . Verapamil Other (See Comments)    Headaches and constipation  . Amlodipine Rash    And Headaches    ROS     Objective:    Physical Exam  BP (!) 168/66   Pulse (!) 59   Ht 5\' 3"  (1.6 m)   Wt 154 lb (69.9 kg)   SpO2 100%   BMI 27.28 kg/m  Wt Readings from Last 3 Encounters:  03/29/19 154 lb (69.9 kg)  03/01/19 152 lb (68.9 kg)  02/25/19 153 lb (69.4 kg)    There are no preventive care reminders to display for this patient.  There are no  preventive care reminders to display for this patient.   Lab Results  Component Value Date   TSH 0.94  09/03/2018   Lab Results  Component Value Date   WBC 6.6 09/03/2018   HGB 12.5 09/03/2018   HCT 37.2 09/03/2018   MCV 94.2 09/03/2018   PLT 264 09/03/2018   Lab Results  Component Value Date   NA 143 02/12/2019   K 4.8 02/12/2019   CO2 29 02/12/2019   GLUCOSE 88 02/12/2019   BUN 12 02/12/2019   CREATININE 1.03 (H) 02/12/2019   BILITOT 0.5 09/03/2018   ALKPHOS 66 01/30/2015   AST 16 09/03/2018   ALT 10 09/03/2018   PROT 6.4 09/03/2018   ALBUMIN 4.1 01/30/2015   CALCIUM 9.9 02/12/2019   Lab Results  Component Value Date   CHOL 177 09/03/2018   Lab Results  Component Value Date   HDL 50 (L) 09/03/2018   Lab Results  Component Value Date   LDLCALC 110 (H) 09/03/2018   Lab Results  Component Value Date   TRIG 81 09/03/2018   Lab Results  Component Value Date   CHOLHDL 3.5 09/03/2018   Lab Results  Component Value Date   HGBA1C 5.4 09/03/2018       Assessment & Plan:   Problem List Items Addressed This Visit      Cardiovascular and Mediastinum   ESSENTIAL HYPERTENSION, BENIGN - Primary   Relevant Orders   MR Brain W Wo Contrast    Other Visit Diagnoses    Ear bleeding, left       Relevant Orders   MR Brain W Wo Contrast   Left hand weakness       Relevant Orders   MR Brain W Wo Contrast   Paresthesia       Relevant Orders   MR Brain W Wo Contrast   Uncontrolled hypertension       Relevant Orders   MR Brain W Wo Contrast     Blood pressures are still quite elevated even with the addition of another 20 mg of Bystolic.  Pulse is mostly in the 50s based on her home report we will continue to monitor.  Most of her pressures were between 1 50-1 60s which I do think is an improvement.  She is just been very difficult to treat because we have tried her on multiple medications and unfortunately has been intolerant so right now we will continue with HCTZ and nebivolol.  Left ear bleeding-unclear etiology.  She did have just a little bit of dried blood  right at the outer canal.  I did not see anything further back or internal did not see any dried blood at the eardrum and gave her reassurance.  She did have some moderate amount of wax but it was not completely blocking her canal.  Encouraged her to call if she notices blood again in the meantime we will just have her apply a small amount of mupirocin ointment twice a day for about 10 days to the area.  Do not see any skin lesions that are worrisome.  I am very concerned about the sudden left hand numbness and then inability to move her finger though it was brief.  Because of her uncontrolled hypertension and hyperlipidemia, though she is on a statin, it is concerning for possible TIA.  Will get MRI for further work-up.   Meds ordered this encounter  Medications  . mupirocin ointment (BACTROBAN) 2 %    Sig: Apply to inside  of left ear BID x 0 days then twice a week for maintenance.    Dispense:  30 g    Refill:  0     Beatrice Lecher, MD

## 2019-04-12 ENCOUNTER — Telehealth: Payer: Self-pay | Admitting: Family Medicine

## 2019-04-12 NOTE — Telephone Encounter (Signed)
Pt called clinic for MRI results, these were done at [facility]. Routing to PCP for review.   MRI Head WO IV Contrast - 03/29/2019 [facility] Result Impression  IMPRESSION:  1. No acute intracranial abnormality is identified. 2. Mild small vessel ischemic disease changes.  Electronically Signed by: Zettie Pho

## 2019-04-12 NOTE — Telephone Encounter (Signed)
Okay, sounds great.  Would you please give patient a call and let her know that there were no worrisome findings such as tumor mass or fluid.  She has some mild small vessel ischemic changes this is typically seen with the aging process and is not necessarily abnormal.  Though it is important to try to control her blood pressure.

## 2019-04-15 NOTE — Telephone Encounter (Signed)
Left VM for Pt to return clinic call regarding results, callback information provided. 

## 2019-04-15 NOTE — Telephone Encounter (Signed)
Pt advised of results and recommendations.

## 2019-04-22 ENCOUNTER — Encounter: Payer: Self-pay | Admitting: Family Medicine

## 2019-05-30 ENCOUNTER — Encounter: Payer: Self-pay | Admitting: Family Medicine

## 2019-05-30 DIAGNOSIS — Z961 Presence of intraocular lens: Secondary | ICD-10-CM | POA: Diagnosis not present

## 2019-05-30 DIAGNOSIS — H0100A Unspecified blepharitis right eye, upper and lower eyelids: Secondary | ICD-10-CM | POA: Diagnosis not present

## 2019-05-30 DIAGNOSIS — H0100B Unspecified blepharitis left eye, upper and lower eyelids: Secondary | ICD-10-CM | POA: Diagnosis not present

## 2019-05-30 DIAGNOSIS — H527 Unspecified disorder of refraction: Secondary | ICD-10-CM | POA: Diagnosis not present

## 2019-05-30 DIAGNOSIS — H40003 Preglaucoma, unspecified, bilateral: Secondary | ICD-10-CM | POA: Diagnosis not present

## 2019-05-30 DIAGNOSIS — H26493 Other secondary cataract, bilateral: Secondary | ICD-10-CM | POA: Diagnosis not present

## 2019-06-04 ENCOUNTER — Encounter: Payer: Self-pay | Admitting: Family Medicine

## 2019-06-04 DIAGNOSIS — H26493 Other secondary cataract, bilateral: Secondary | ICD-10-CM | POA: Diagnosis not present

## 2019-06-26 NOTE — Progress Notes (Signed)
Subjective:   Tina Medina is a 78 y.o. female who presents for Medicare Annual (Subsequent) preventive examination.  Review of Systems:  No ROS.  Medicare Wellness Virtual Visit.  Visual/audio telehealth visit, UTA vital signs.   See social history for additional risk factors.    Cardiac Risk Factors include: advanced age (>65men, >72 women);sedentary lifestyle;hypertension Sleep patterns: Getting on average of 5 hours of sleep a night. Wakes up 1 time a night to void. Wakes up and feels refreshed Home Safety/Smoke Alarms: Feels safe in home. Smoke alarms in place.  Living environment;Lives alone in 1 story home. Shower is a step over tub/combo and grab bars are in place. Seat Belt Safety/Bike Helmet: Wears seat belt.   Female:   Pap- aged out      Mammo- UTD      Dexa scan-   ordered     CCS- aged out     Objective:     Vitals: BP (!) 148/67   Pulse (!) 56   Ht 5\' 4"  (1.626 m)   BMI 26.43 kg/m   Body mass index is 26.43 kg/m.  Advanced Directives 07/01/2019 12/19/2014  Does Patient Have a Medical Advance Directive? Yes Yes  Type of Paramedic of [place];Living will [place]  Does patient want to make changes to medical advance directive? No - Patient declined No - Patient declined  Copy of [place] in Chart? No - copy requested Yes    Tobacco Social History   Tobacco Use  Smoking Status Former Smoker  . Types: Cigarettes  Smokeless Tobacco Former Systems developer  . Quit date: 12/05/1962     Counseling given: Not Answered   Clinical Intake:  Pre-visit preparation completed: Yes  Pain : No/denies pain     Nutritional Risks: None Diabetes: No  How often do you need to have someone help you when you read instructions, pamphlets, or other written materials from your doctor or pharmacy?: 1 - Never What is the last grade level you completed in school?: 13  Interpreter Needed?: No  Information entered  by :: Orlie Dakin, LPN  Past Medical History:  Diagnosis Date  . DDD (degenerative disc disease), lumbar    L spine, spinal stenosis  . Gastritis    H Pylori  . Hyperlipidemia   . Hypertension   . Lumbar degenerative disc disease 2002   with stenosis  . 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 Menopause    since age 65  . Migraine   . Osteoma    2 mm of the L lid ethmoid air cells   Past Surgical History:  Procedure Laterality Date  . BREAST SURGERY  1969   Left   Family History  Problem Relation Age of Onset  . Hyperlipidemia Mother   . Hypertension Mother   . Diabetes Mother   . Prostate cancer Father   . Hypertension Brother    Social History   Socioeconomic History  . Marital status: Widowed    Spouse name: Not on file  . Number of children: 3  . Years of education: 39  . Highest education level: Some college, no degree  Occupational History  . Occupation: Retired.      Employer: [employer]    Comment: Retired  Scientific laboratory technician  . Financial resource strain: Not hard at all  . Food insecurity    Worry: Never true    Inability: Never  true  . Transportation needs    Medical: No    Non-medical: No  Tobacco Use  . Smoking status: Former Smoker    Types: Cigarettes  . Smokeless tobacco: Former Systems developer    Quit date: 12/05/1962  Substance and Sexual Activity  . Alcohol use: Yes    Comment: Occasional glass of wine  . Drug use: No  . Sexual activity: Not Currently  Lifestyle  . Physical activity    Days per week: 0 days    Minutes per session: 0 min  . Stress: Not at all  Relationships  . Social connections    Talks on phone: More than three times a week    Gets together: Once a week    Attends religious service: More than 4 times per year    Active member of club or organization: No    Attends meetings of clubs or organizations: Never    Relationship status: Widowed  Other Topics Concern  . Not on file  Social History Narrative  . Not  on file    Outpatient Encounter Medications as of 07/01/2019  Medication Sig  . aspirin 81 MG chewable tablet Chew 81 mg by mouth daily.    . hydrochlorothiazide (MICROZIDE) 12.5 MG capsule Take 1 capsule (12.5 mg total) by mouth daily.  . mupirocin ointment (BACTROBAN) 2 % Apply to inside of left ear BID x 0 days then twice a week for maintenance.  . rosuvastatin (CRESTOR) 5 MG tablet Take 5 mg by mouth at bedtime.  [name] ketorolac (ACULAR) 0.5 % ophthalmic solution Place 1 drop into both eyes 4 (four) times daily.  . Nebivolol HCl 20 MG TABS Take 1 tablet (20 mg total) by mouth 2 (two) times daily. (Patient not taking: Reported on 07/01/2019)  . triamcinolone ointment (KENALOG) 0.5 % Apply topically at bedtime as needed. Itching ankles (Patient not taking: Reported on 07/01/2019)   No facility-administered encounter medications on file as of 07/01/2019.     Activities of Daily Living In your present state of health, do you have any difficulty performing the following activities: 07/01/2019  Hearing? N  Vision? N  Difficulty concentrating or making decisions? N  Walking or climbing stairs? N  Dressing or bathing? N  Doing errands, shopping? N  Preparing Food and eating ? N  Using the Toilet? N  In the past six months, have you accidently leaked urine? N  Do you have problems with loss of bowel control? N  Managing your Medications? N  Managing your Finances? N  Housekeeping or managing your Housekeeping? N  Some recent data might be hidden    Patient Care Team: Hali Marry, MD as PCP - General (Family Medicine)    Assessment:   This is a routine wellness examination for [place].  Exercise Activities and Dietary recommendations Current Exercise Habits: The patient does not participate in regular exercise at present, Exercise limited by: None identified Diet  Eats a healthy diet of fruits, vegetables and proteins Breakfast:cereal, coffee and fruit Lunch: skips Dinner: Meat  and vegetables    Advised to get at least 32 ounces of water a day  Goals    . Exercise 3x per week (30 min per time)     Would like to get back to exercising- waiting on the [employer] to open back up- she walks on the track 2 times a week       Fall Risk Fall Risk  07/01/2019 01/27/2014 12/26/2013 01/23/2013  Falls in the past  year? 0 No No No  Injury with Fall? 0 - - -  Follow up Falls prevention discussed - - -   Is the patient's home free of loose throw rugs in walkways, pet beds, electrical cords, etc?   yes      Grab bars in the bathroom? yes      Handrails on the stairs?   no      Adequate lighting?   yes   Depression Screen PHQ 2/9 Scores 07/01/2019 03/11/2019 01/27/2014 12/26/2013  PHQ - 2 Score 0 0 1 1  PHQ- 9 Score - - 1 1     Cognitive Function     6CIT Screen 07/01/2019  What Year? 0 points  What month? 0 points  What time? 0 points  Count back from 20 0 points  Months in reverse 0 points  Repeat phrase 4 points  Total Score 4    Immunization History  Administered Date(s) Administered  . Influenza Split 08/20/2012  . Influenza Whole 10/16/2009, 08/12/2011  . Influenza, High Dose Seasonal PF 08/30/2018  . Influenza,inj,Quad PF,6+ Mos 09/30/2013, 07/28/2014  . Pneumococcal Conjugate-13 05/12/2015  . Pneumococcal Polysaccharide-23 02/26/2008  . Td 12/05/2004  . Zoster 11/05/2013    Screening Tests Health Maintenance  Topic Date Due  . TETANUS/TDAP  10/12/2019 (Originally 12/05/2014)  . INFLUENZA VACCINE  07/06/2019  . DEXA SCAN  Completed  . PNA vac Low Risk Adult  Completed       Plan:      Ms. Umphlett , Thank you for taking time to come for your Medicare Wellness Visit. I appreciate your ongoing commitment to your health goals. Please review the following plan we discussed and let me know if I can assist you in the future.   Please schedule your next medicare wellness visit with me in 1 yr.     These are the goals we discussed: Goals    . Exercise  3x per week (30 min per time)     Would like to get back to exercising- [facility] on the [employer] to [facility] back up- she walks on the track 2 times a week       This is a list of the screening recommended for you and due dates:  Health Maintenance  Topic Date Due  . Tetanus Vaccine  10/12/2019*  . Flu Shot  07/06/2019  . DEXA scan (bone density measurement)  Completed  . Pneumonia vaccines  Completed  *Topic was postponed. The date shown is not the original due date.     These are the goals we discussed: Goals    . Exercise 3x per week (30 min per time)     Would like to get back to exercising- [facility] on the [employer] to [facility] back up- she walks on the track 2 times a week       This is a list of the screening recommended for you and due dates:  Health Maintenance  Topic Date Due  . Tetanus Vaccine  10/12/2019*  . Flu Shot  07/06/2019  . DEXA scan (bone density measurement)  Completed  . Pneumonia vaccines  Completed  *Topic was postponed. The date shown is not the original due date.     These are the goals we discussed: Goals    . Exercise 3x per week (30 min per time)     Would like to get back to exercising- [facility] on the [employer] to [facility] back up- she walks on the track 2 times a  week       This is a list of the screening recommended for you and due dates:  Health Maintenance  Topic Date Due  . Tetanus Vaccine  10/12/2019*  . Flu Shot  07/06/2019  . DEXA scan (bone density measurement)  Completed  . Pneumonia vaccines  Completed  *Topic was postponed. The date shown is not the original due date.      I have personally reviewed and noted the following in the patient's chart:   . Medical and social history . Use of alcohol, tobacco or illicit drugs  . Current medications and supplements . Functional ability and status . Nutritional status . Physical activity . Advanced directives . List of other physicians . Hospitalizations, surgeries, and ER visits in previous 12  months . Vitals . Screenings to include cognitive, depression, and falls . Referrals and appointments  In addition, I have reviewed and discussed with patient certain preventive protocols, quality metrics, and best practice recommendations. A written personalized care plan for preventive services as well as general preventive health recommendations were provided to patient.     Joanne Chars, LPN  08/24/1659

## 2019-07-01 ENCOUNTER — Ambulatory Visit (INDEPENDENT_AMBULATORY_CARE_PROVIDER_SITE_OTHER): Payer: Medicare HMO | Admitting: *Deleted

## 2019-07-01 VITALS — BP 148/67 | HR 56 | Ht 64.0 in

## 2019-07-01 DIAGNOSIS — Z13820 Encounter for screening for osteoporosis: Secondary | ICD-10-CM | POA: Diagnosis not present

## 2019-07-01 DIAGNOSIS — Z0000 Encounter for general adult medical examination without abnormal findings: Secondary | ICD-10-CM

## 2019-07-01 NOTE — Patient Instructions (Signed)
Tina Medina , Thank you for taking time to come for your Medicare Wellness Visit. I appreciate your ongoing commitment to your health goals. Please review the following plan we discussed and let me know if I can assist you in the future.   Please schedule your next medicare wellness visit with me in 1 yr. These are the goals we discussed: Goals    . Exercise 3x per week (30 min per time)     Would like to get back to exercising- waiting on the [employer] to [facility] back up- she walks on the track 2 times a week

## 2019-07-17 ENCOUNTER — Ambulatory Visit (INDEPENDENT_AMBULATORY_CARE_PROVIDER_SITE_OTHER): Payer: Medicare HMO

## 2019-07-17 ENCOUNTER — Other Ambulatory Visit: Payer: Self-pay

## 2019-07-17 DIAGNOSIS — Z780 Asymptomatic menopausal state: Secondary | ICD-10-CM | POA: Diagnosis not present

## 2019-07-17 DIAGNOSIS — M85852 Other specified disorders of bone density and structure, left thigh: Secondary | ICD-10-CM | POA: Diagnosis not present

## 2019-07-17 DIAGNOSIS — Z0000 Encounter for general adult medical examination without abnormal findings: Secondary | ICD-10-CM

## 2019-07-17 DIAGNOSIS — Z13820 Encounter for screening for osteoporosis: Secondary | ICD-10-CM | POA: Diagnosis not present

## 2019-07-19 DIAGNOSIS — H26492 Other secondary cataract, left eye: Secondary | ICD-10-CM | POA: Diagnosis not present

## 2019-08-07 ENCOUNTER — Ambulatory Visit (INDEPENDENT_AMBULATORY_CARE_PROVIDER_SITE_OTHER): Payer: Medicare HMO | Admitting: Family Medicine

## 2019-08-07 ENCOUNTER — Other Ambulatory Visit: Payer: Self-pay

## 2019-08-07 DIAGNOSIS — Z23 Encounter for immunization: Secondary | ICD-10-CM | POA: Diagnosis not present

## 2019-09-23 DIAGNOSIS — H52223 Regular astigmatism, bilateral: Secondary | ICD-10-CM | POA: Diagnosis not present

## 2019-10-08 ENCOUNTER — Ambulatory Visit (INDEPENDENT_AMBULATORY_CARE_PROVIDER_SITE_OTHER): Payer: Medicare HMO | Admitting: Family Medicine

## 2019-10-08 ENCOUNTER — Encounter: Payer: Self-pay | Admitting: Family Medicine

## 2019-10-08 VITALS — BP 161/75 | Temp 98.0°F

## 2019-10-08 DIAGNOSIS — R05 Cough: Secondary | ICD-10-CM | POA: Diagnosis not present

## 2019-10-08 DIAGNOSIS — I10 Essential (primary) hypertension: Secondary | ICD-10-CM

## 2019-10-08 DIAGNOSIS — R0602 Shortness of breath: Secondary | ICD-10-CM

## 2019-10-08 DIAGNOSIS — R059 Cough, unspecified: Secondary | ICD-10-CM

## 2019-10-08 NOTE — Progress Notes (Signed)
Virtual Visit via Video Note  I connected with [name] on 10/08/19 at  1:00 PM EST by a video enabled telemedicine application and verified that I am speaking with the correct person using two identifiers.  She was unable to get the video portion to connect.   I discussed the limitations of evaluation and management by telemedicine and the availability of in person appointments. The patient expressed understanding and agreed to proceed.      Established Patient Office Visit  Subjective:  Patient ID: Tina Medina, female    DOB: 12-21-1940  Age: 78 y.o. MRN: [id number]  CC:  Chief Complaint  Patient presents with  . Cough  . Shortness of Breath    x 45mos. she stated that she gets SOB when she has doing lawnwork bending over causes this    HPI Tina Medina presents for SOB x 2 months when she does her yardwork.  Had a mild cough as well.  She denies any chest pain.  She feels SOB when she bends over and stands back up. She says she called today bc she noticed this shortness of breath when she walked across the parking lot and that scared her.  She denies any significant nasal congestion.  She has had watery itchy eyes and a runny nose for the last couple of months.  She denies any swelling in the extremities or chest pain.  She denies any recent heartburn symptoms.  She denies any small sore throat or problems swallowing.  She has noticed some wheezing in her chest and she says she has never had that before.  She denies any fevers, sweats or chills.  She says about a month ago she had diarrhea for a few days and had a little bit of pain above her bellybutton but that seems to have gone away.  Past Medical History:  Diagnosis Date  . DDD (degenerative disc disease), lumbar    L spine, spinal stenosis  . Gastritis    H Pylori  . Hyperlipidemia   . Hypertension   . Lumbar degenerative disc disease 2002   with stenosis  . 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 Menopause    since age 53  . Migraine   . Osteoma    2 mm of the L lid ethmoid air cells    Past Surgical History:  Procedure Laterality Date  . BREAST SURGERY  1969   Left    Family History  Problem Relation Age of Onset  . Hyperlipidemia Mother   . Hypertension Mother   . Diabetes Mother   . Prostate cancer Father   . Hypertension Brother     Social History   Socioeconomic History  . Marital status: Widowed    Spouse name: Not on file  . Number of children: 3  . Years of education: 31  . Highest education level: Some college, no degree  Occupational History  . Occupation: Retired.      Employer: [employer]    Comment: Retired  Scientific laboratory technician  . Financial resource strain: Not hard at all  . Food insecurity    Worry: Never true    Inability: Never true  . Transportation needs    Medical: No    Non-medical: No  Tobacco Use  . Smoking status: Former Smoker    Types: Cigarettes  . Smokeless tobacco: Former Systems developer    Quit date: 12/05/1962  Substance and Sexual Activity  .  Alcohol use: Yes    Comment: Occasional glass of wine  . Drug use: No  . Sexual activity: Not Currently  Lifestyle  . Physical activity    Days per week: 0 days    Minutes per session: 0 min  . Stress: Not at all  Relationships  . Social connections    Talks on phone: More than three times a week    Gets together: Once a week    Attends religious service: More than 4 times per year    Active member of club or organization: No    Attends meetings of clubs or organizations: Never    Relationship status: Widowed  . Intimate partner violence    Fear of current or ex partner: No    Emotionally abused: No    Physically abused: No    Forced sexual activity: No  Other Topics Concern  . Not on file  Social History Narrative  . Not on file    Outpatient Medications Prior to Visit  Medication Sig Dispense Refill  . aspirin 81 MG chewable tablet Chew 81 mg by mouth daily.      .  hydrochlorothiazide (MICROZIDE) 12.5 MG capsule Take 1 capsule (12.5 mg total) by mouth daily. 90 capsule 1  . ketorolac (ACULAR) 0.5 % ophthalmic solution Place 1 drop into both eyes 4 (four) times daily.    . Nebivolol HCl 20 MG TABS Take 1 tablet (20 mg total) by mouth 2 (two) times daily. 60 tablet 5  . rosuvastatin (CRESTOR) 5 MG tablet Take 5 mg by mouth at bedtime.    . mupirocin ointment (BACTROBAN) 2 % Apply to inside of left ear BID x 0 days then twice a week for maintenance. 30 g 0  . triamcinolone ointment (KENALOG) 0.5 % Apply topically at bedtime as needed. Itching ankles 45 g 1   No facility-administered medications prior to visit.     Allergies  Allergen Reactions  . Ace Inhibitors     REACTION: cough Other reaction(s): Abdominal Pain, Chest Pain Patient develops cough, headaches  . Atenolol   . Benicar [Olmesartan Medoxomil] Other (See Comments)    Chest pain  . Diltiazem Other (See Comments)    Sharp head ache.   . Hydralazine Other (See Comments)    "Chills"   . Losartan Other (See Comments)    headache  . Metoprolol Diarrhea    Abdominal cramping.   . Micardis [Telmisartan] Other (See Comments)    Chest Pain, Back Pain.   [name] Penicillins     REACTION: rash  . Verapamil Other (See Comments)    Headaches and constipation  . Amlodipine Rash    And Headaches    ROS Review of Systems    Objective:    Physical Exam  Constitutional: She is oriented to person, place, and time.  Pulmonary/Chest: Effort normal.  Neurological: She is alert and oriented to person, place, and time.  Psychiatric: She has a normal mood and affect. Her behavior is normal.    BP (!) 161/75   Temp 98 F (36.7 C)  Wt Readings from Last 3 Encounters:  03/29/19 154 lb (69.9 kg)  03/01/19 152 lb (68.9 kg)  02/25/19 153 lb (69.4 kg)     There are no preventive care reminders to display for this patient.  There are no preventive care reminders to display for this  patient.  Lab Results  Component Value Date   TSH 0.94 09/03/2018   Lab Results  Component Value  Date   WBC 6.6 09/03/2018   HGB 12.5 09/03/2018   HCT 37.2 09/03/2018   MCV 94.2 09/03/2018   PLT 264 09/03/2018   Lab Results  Component Value Date   NA 143 02/12/2019   K 4.8 02/12/2019   CO2 29 02/12/2019   GLUCOSE 88 02/12/2019   BUN 12 02/12/2019   CREATININE 1.03 (H) 02/12/2019   BILITOT 0.5 09/03/2018   ALKPHOS 66 01/30/2015   AST 16 09/03/2018   ALT 10 09/03/2018   PROT 6.4 09/03/2018   ALBUMIN 4.1 01/30/2015   CALCIUM 9.9 02/12/2019   Lab Results  Component Value Date   CHOL 177 09/03/2018   Lab Results  Component Value Date   HDL 50 (L) 09/03/2018   Lab Results  Component Value Date   LDLCALC 110 (H) 09/03/2018   Lab Results  Component Value Date   TRIG 81 09/03/2018   Lab Results  Component Value Date   CHOLHDL 3.5 09/03/2018   Lab Results  Component Value Date   HGBA1C 5.4 09/03/2018      Assessment & Plan:   Problem List Items Addressed This Visit      Cardiovascular and Mediastinum   ESSENTIAL HYPERTENSION, BENIGN - Primary   Relevant Orders   COMPLETE METABOLIC PANEL WITH GFR   TSH   DG Chest 2 View   CBC with Differential/Platelet    Other Visit Diagnoses    SOB (shortness of breath)       Relevant Orders   COMPLETE METABOLIC PANEL WITH GFR   TSH   DG Chest 2 View   CBC with Differential/Platelet   Cough       Relevant Orders   COMPLETE METABOLIC PANEL WITH GFR   TSH   DG Chest 2 View   CBC with Differential/Platelet      This of breath/cough x2 months.  No lower extremity swelling to suggest heart failure no chest pain either.  It sounds like this could be allergic related because she is also having a runny nose and watery itchy eyes.  [name] possibly a trial of an allergy medication.  We will do some labs to rule out anemia, thyroid problems etc.  We will do chest x-ray.  Interestingly she had similar symptoms last  year right around this time.  We also discussed possibly a trial of an albuterol inhaler but she declined she wants to get the blood work back first.  If everything comes back normal then recommend a trial of an antihistamine or Singulair for 2 weeks and if still not improving then consider further cardiac work-up and or spirometry.  No orders of the defined types were placed in this encounter.   Follow-up: Return if symptoms worsen or fail to improve.     I discussed the assessment and treatment plan with the patient. The patient was provided an opportunity to ask questions and all were answered. The patient agreed with the plan and demonstrated an understanding of the instructions.   The patient was advised to call back or seek an in-person evaluation if the symptoms worsen or if the condition fails to improve as anticipated.  Time spent 25 minutes in nonface-to-face encounter.  Beatrice Lecher, MD

## 2019-10-09 DIAGNOSIS — R05 Cough: Secondary | ICD-10-CM | POA: Diagnosis not present

## 2019-10-09 DIAGNOSIS — R0602 Shortness of breath: Secondary | ICD-10-CM | POA: Diagnosis not present

## 2019-10-09 DIAGNOSIS — I10 Essential (primary) hypertension: Secondary | ICD-10-CM | POA: Diagnosis not present

## 2019-10-10 LAB — CBC WITH DIFFERENTIAL/PLATELET
Absolute Monocytes: 528 cells/uL (ref 200–950)
Basophils Absolute: 53 cells/uL (ref 0–200)
Basophils Relative: 0.8 %
Eosinophils Absolute: 158 cells/uL (ref 15–500)
Eosinophils Relative: 2.4 %
HCT: 40.6 % (ref 35.0–45.0)
Hemoglobin: 13.9 g/dL (ref 11.7–15.5)
Lymphs Abs: 1630 cells/uL (ref 850–3900)
MCH: 32.5 pg (ref 27.0–33.0)
MCHC: 34.2 g/dL (ref 32.0–36.0)
MCV: 94.9 fL (ref 80.0–100.0)
MPV: 11.7 fL (ref 7.5–12.5)
Monocytes Relative: 8 %
Neutro Abs: 4231 cells/uL (ref 1500–7800)
Neutrophils Relative %: 64.1 %
Platelets: 298 10*3/uL (ref 140–400)
RBC: 4.28 10*6/uL (ref 3.80–5.10)
RDW: 12.5 % (ref 11.0–15.0)
Total Lymphocyte: 24.7 %
WBC: 6.6 10*3/uL (ref 3.8–10.8)

## 2019-10-10 LAB — COMPLETE METABOLIC PANEL WITH GFR
AG Ratio: 1.4 (calc) (ref 1.0–2.5)
ALT: 13 U/L (ref 6–29)
AST: 20 U/L (ref 10–35)
Albumin: 4.2 g/dL (ref 3.6–5.1)
Alkaline phosphatase (APISO): 72 U/L (ref 37–153)
BUN/Creatinine Ratio: 11 (calc) (ref 6–22)
BUN: 13 mg/dL (ref 7–25)
CO2: 32 mmol/L (ref 20–32)
Calcium: 9.8 mg/dL (ref 8.6–10.4)
Chloride: 105 mmol/L (ref 98–110)
Creat: 1.14 mg/dL — ABNORMAL HIGH (ref 0.60–0.88)
GFR, Est African American: 53 mL/min/{1.73_m2} — ABNORMAL LOW (ref >=60)
GFR, Est Non African American: 45 mL/min/{1.73_m2} — ABNORMAL LOW (ref >=60)
Globulin: 3 g/dL (calc) (ref 1.9–3.7)
Glucose, Bld: 89 mg/dL (ref 65–99)
Potassium: 4.4 mmol/L (ref 3.5–5.3)
Sodium: 142 mmol/L (ref 135–146)
Total Bilirubin: 0.5 mg/dL (ref 0.2–1.2)
Total Protein: 7.2 g/dL (ref 6.1–8.1)

## 2019-10-10 LAB — TSH: TSH: 1.45 mIU/L (ref 0.40–4.50)

## 2019-10-11 ENCOUNTER — Telehealth: Payer: Self-pay

## 2019-10-11 DIAGNOSIS — R7989 Other specified abnormal findings of blood chemistry: Secondary | ICD-10-CM

## 2019-10-11 DIAGNOSIS — Z833 Family history of diabetes mellitus: Secondary | ICD-10-CM

## 2019-10-11 DIAGNOSIS — I10 Essential (primary) hypertension: Secondary | ICD-10-CM

## 2019-10-11 NOTE — Telephone Encounter (Signed)
Per recent lab results: "Notes recorded by Hali Marry, MD on 10/10/2019 at 1:20 PM EST  Hi Tina Medina, kidney function jumped up to 1.1. Normally run around 0.9. This is a little bit of an elevation from your baseline. It is normal. Blood counts normal. No sign of anemia or infection. I would like to check your urine for protein loss and also get you scheduled for an ultrasound if that is okay then please let us know and we will get those ordered."  Please order ultrasound for patient. I have ordered a microalbumin for patient to have done at lab on same day as U/S

## 2019-10-11 NOTE — Telephone Encounter (Signed)
Labs ordered.

## 2019-10-14 ENCOUNTER — Ambulatory Visit (INDEPENDENT_AMBULATORY_CARE_PROVIDER_SITE_OTHER): Payer: Medicare HMO

## 2019-10-14 ENCOUNTER — Other Ambulatory Visit: Payer: Self-pay

## 2019-10-14 DIAGNOSIS — R05 Cough: Secondary | ICD-10-CM | POA: Diagnosis not present

## 2019-10-14 DIAGNOSIS — R0602 Shortness of breath: Secondary | ICD-10-CM | POA: Diagnosis not present

## 2019-10-14 DIAGNOSIS — Z833 Family history of diabetes mellitus: Secondary | ICD-10-CM | POA: Diagnosis not present

## 2019-10-14 DIAGNOSIS — R059 Cough, unspecified: Secondary | ICD-10-CM

## 2019-10-14 DIAGNOSIS — R7989 Other specified abnormal findings of blood chemistry: Secondary | ICD-10-CM | POA: Diagnosis not present

## 2019-10-14 DIAGNOSIS — I10 Essential (primary) hypertension: Secondary | ICD-10-CM | POA: Diagnosis not present

## 2019-10-15 LAB — URINALYSIS, ROUTINE W REFLEX MICROSCOPIC
Bacteria, UA: NONE SEEN /HPF
Bilirubin Urine: NEGATIVE
Glucose, UA: NEGATIVE
Hgb urine dipstick: NEGATIVE
Hyaline Cast: NONE SEEN /LPF
Ketones, ur: NEGATIVE
Nitrite: NEGATIVE
Protein, ur: NEGATIVE
RBC / HPF: NONE SEEN /HPF (ref 0–2)
Specific Gravity, Urine: 1.009 (ref 1.001–1.03)
Squamous Epithelial / HPF: NONE SEEN /HPF (ref <=5)
pH: 6.5 (ref 5.0–8.0)

## 2019-10-15 LAB — MICROALBUMIN, URINE: Microalb, Ur: 1.5 mg/dL

## 2019-10-17 ENCOUNTER — Ambulatory Visit (INDEPENDENT_AMBULATORY_CARE_PROVIDER_SITE_OTHER): Payer: Medicare HMO

## 2019-10-17 ENCOUNTER — Other Ambulatory Visit: Payer: Self-pay

## 2019-10-17 DIAGNOSIS — R7989 Other specified abnormal findings of blood chemistry: Secondary | ICD-10-CM

## 2019-10-17 DIAGNOSIS — I10 Essential (primary) hypertension: Secondary | ICD-10-CM

## 2019-10-17 DIAGNOSIS — Z833 Family history of diabetes mellitus: Secondary | ICD-10-CM

## 2019-10-17 DIAGNOSIS — N281 Cyst of kidney, acquired: Secondary | ICD-10-CM | POA: Diagnosis not present

## 2019-10-28 ENCOUNTER — Other Ambulatory Visit: Payer: Self-pay

## 2019-10-28 ENCOUNTER — Ambulatory Visit (INDEPENDENT_AMBULATORY_CARE_PROVIDER_SITE_OTHER): Payer: Medicare HMO | Admitting: Family Medicine

## 2019-10-28 ENCOUNTER — Encounter: Payer: Self-pay | Admitting: Family Medicine

## 2019-10-28 VITALS — BP 158/66 | HR 61

## 2019-10-28 DIAGNOSIS — R1013 Epigastric pain: Secondary | ICD-10-CM | POA: Diagnosis not present

## 2019-10-28 DIAGNOSIS — R519 Headache, unspecified: Secondary | ICD-10-CM | POA: Diagnosis not present

## 2019-10-28 NOTE — Progress Notes (Signed)
Virtual Visit via Telephone Note  I connected with Tina Medina on 10/28/19 at  8:50 AM EST by telephone and verified that I am speaking with the correct person using two identifiers.   I discussed the limitations, risks, security and privacy concerns of performing an evaluation and management service by telephone and the availability of in person appointments. I also discussed with the patient that there may be a patient responsible charge related to this service. The patient expressed understanding and agreed to proceed.    Subjective:    CC: abd pain x 2-3 mo  HPI:  78 year old female complains of abdominal pain just above her navel.  She says it is worse if she bends over, or twists.  We recently did a renal ultrasound for elevated creatinine just showed no acute abnormalities no hydronephrosis or bladder distention.  She has had some simple bilateral renal cysts.  Analysis was also normal.  No proteinuria etc.  Last creatinine was 1.1.  She denies any injury or trauma to the abdomen.  She denies any nausea with the pain she says is been going on for about 2 to 3 months.  She wonders if it could be related to her back pain.  She says she is having mostly mid back pain just to the right side.  This is not new and has been going on for a while.  She also wanted to discuss her head.  She says at times she feels the most disoriented.  She says it is only when she puts her new glasses on.  She is actually gone back to the eye doctor 3 times and they feel like her prescription is correct but she says every time she puts them on she feels disoriented to the point that she actually fell in the yard.  She felt like the ground was a little higher than what it really was with her lenses on.  She does report though that she has had some tenderness in her temples for the last 6 months and in the last few weeks she is actually been getting some intermittent sharp pains in her temples.  No major headaches.   She does drink 2 cups of coffee in the morning and no other caffeine daily.  CKD 3-recent serum creatinine was just mildly elevated at 1.1 so we did a renal ultrasound which was essentially normal showing some simple bilateral renal cysts.  No proteinuria.   Past medical history, Surgical history, Family history not pertinant except as noted below, Social history, Allergies, and medications have been entered into the medical record, reviewed, and corrections made.   Review of Systems: No fevers, chills, night sweats, weight loss, chest pain, or shortness of breath.   Objective:    General: Speaking clearly in complete sentences without any shortness of breath.  Alert and oriented x3.  Normal judgment. No apparent acute distress.    Impression and Recommendations:    Temple pain-has had tenderness for 6 months but more recently having sharp shooting pains.  [name] options including getting a sed rate to evaluate for temporal arteritis though think it is a little less likely.  She says she will think about it and let me know.  Sense of disorientation while wearing her glasses-even though she is gone back to her eye doctor several times it only happens when she wears her lenses so I think it is related to the lenses I do not think there is any kind of underlying brain  issue.  We had actually previously ordered a brain MRI.  Abdominal pain mostly in the epigastric area worse with bending over.  Unclear if it could be a hernia.  It does not sound typical of reflux though she has been waking up a little nauseated in the mornings.  She is not currently on a PPI.  No major dietary changes.  If it persists or worsen then encourage her to please come in so that I can evaluate her abdomen.     I discussed the assessment and treatment plan with the patient. The patient was provided an opportunity to ask questions and all were answered. The patient agreed with the plan and demonstrated an  understanding of the instructions.   The patient was advised to call back or seek an in-person evaluation if the symptoms worsen or if the condition fails to improve as anticipated.  I provided 25 minutes of non-face-to-face time during this encounter.   Beatrice Lecher, MD

## 2019-10-28 NOTE — Progress Notes (Signed)
She reports that is above her navel pain comes when she bends over or twists. Not taking anything for this. Tina Medina, Lahoma Crocker, CMA

## 2019-11-02 ENCOUNTER — Encounter: Payer: Self-pay | Admitting: Family Medicine

## 2019-11-02 ENCOUNTER — Other Ambulatory Visit: Payer: Self-pay

## 2019-11-02 ENCOUNTER — Emergency Department
Admission: EM | Admit: 2019-11-02 | Discharge: 2019-11-02 | Disposition: A | Discharge status: Home / Self Care | Payer: Medicare HMO | Source: Home / Self Care

## 2019-11-02 DIAGNOSIS — M436 Torticollis: Secondary | ICD-10-CM

## 2019-11-02 MED ORDER — PREDNISONE 50 MG PO TABS: ORAL_TABLET | ORAL | 1 refills | Status: DC

## 2019-11-02 NOTE — ED Triage Notes (Signed)
Patient c/o right sided neck pain since yesterday when she woke up.  No apparent injury, feels like a stiff neck.

## 2019-11-02 NOTE — ED Provider Notes (Signed)
[facility]    CSN: [id number] Arrival date & time: 11/02/19  [id number]      History   Chief Complaint Chief Complaint  Patient presents with  . Neck pain    HPI Tina Medina is a 78 y.o. female.   78 yo woman making initial visit to [facility] for evaluation of neck pain.  She started having sharp neck pains when she turned her head certain ways yesterday and the sharp pains have continued.  These lancinating pains shoot up into the right occipital area.  The headache that she had the other day and scalp tenderness is gone.  She says she has some fasciculations in her right eye, but her vision problems have been going on for couple years and she relates some to cataract surgery.   Note from 68/5: 78 year old female complains of abdominal pain just above her navel.  She says it is worse if she bends over, or twists.  We recently did a renal ultrasound for elevated creatinine just showed no acute abnormalities no hydronephrosis or bladder distention.  She has had some simple bilateral renal cysts.  Analysis was also normal.  No proteinuria etc.  Last creatinine was 1.1.  She denies any injury or trauma to the abdomen.  She denies any nausea with the pain she says is been going on for about 2 to 3 months.  She wonders if it could be related to her back pain.  She says she is having mostly mid back pain just to the right side.  This is not new and has been going on for a while.  She also wanted to discuss her head.  She says at times she feels the most disoriented.  She says it is only when she puts her new glasses on.  She is actually gone back to the eye doctor 3 times and they feel like her prescription is correct but she says every time she puts them on she feels disoriented to the point that she actually fell in the yard.  She felt like the ground was a little higher than what it really was with her lenses on.  She does report though that she has had some tenderness in her  temples for the last 6 months and in the last few weeks she is actually been getting some intermittent sharp pains in her temples.  No major headaches.  She does drink 2 cups of coffee in the morning and no other caffeine daily.  CKD 3-recent serum creatinine was just mildly elevated at 1.1 so we did a renal ultrasound which was essentially normal showing some simple bilateral renal cysts.  No proteinuria.     Past Medical History:  Diagnosis Date  . DDD (degenerative disc disease), lumbar    L spine, spinal stenosis  . Gastritis    H Pylori  . Hyperlipidemia   . Hypertension   . Lumbar degenerative disc disease 2002   with stenosis  . 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 Menopause    since age 49  . Migraine   . Osteoma    2 mm of the L lid ethmoid air cells    Patient Active Problem List   Diagnosis Date Noted  . 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 Acute pain of right knee 12/10/2018  . Insomnia 05/12/2015  . Left shoulder pain 08/27/2014  . Depression 01/12/2012  . Neck pain, chronic 04/18/2011  .  NIGHT SWEATS 01/17/2011  . CAROTID BRUIT, LEFT 01/17/2011  . PERSONAL HISTORY OF COLONIC POLYPS 01/17/2011  . ALLERGIC RHINITIS 09/03/2010  . GOITER, MULTINODULAR 07/23/2010  . HYPERLIPIDEMIA 05/10/2010  . MENINGIOMA 03/03/2009  . BENIGN POSITIONAL VERTIGO 03/28/2008  . GERD 03/20/2008  . DERMATITIS, ATOPIC 03/13/2008  . SPINAL STENOSIS, LUMBAR 03/13/2008  . POSTMENOPAUSAL STATUS 02/26/2008  . ESSENTIAL HYPERTENSION, BENIGN 02/11/2008    Past Surgical History:  Procedure Laterality Date  . BREAST SURGERY  1969   Left    OB History   No obstetric history on file.      Home Medications    Prior to Admission medications   Medication Sig Start Date End Date Taking? Authorizing Provider  aspirin 81 MG chewable tablet Chew 81 mg by mouth daily.     Yes [provider]  hydrochlorothiazide (MICROZIDE) 12.5 MG  capsule Take 1 capsule (12.5 mg total) by mouth daily. 02/11/19  Yes Hali Marry, MD  ketorolac (ACULAR) 0.5 % ophthalmic solution Place 1 drop into both eyes 4 (four) times daily. 02/22/19  Yes [provider]  Nebivolol HCl 20 MG TABS Take 1 tablet (20 mg total) by mouth 2 (two) times daily. 02/25/19  Yes Hali Marry, MD  rosuvastatin (CRESTOR) 5 MG tablet Take 5 mg by mouth at bedtime.   Yes [provider]  predniSONE (DELTASONE) 50 MG tablet 1 daily with food 11/02/19   Robyn Haber, MD    Family History Family History  Problem Relation Age of Onset  . Hyperlipidemia Mother   . Hypertension Mother   . Diabetes Mother   . Prostate cancer Father   . Hypertension Brother     Social History Social History   Tobacco Use  . Smoking status: Former Smoker    Types: Cigarettes  . Smokeless tobacco: Former Systems developer    Quit date: 12/05/1962  Substance Use Topics  . Alcohol use: Yes    Comment: Occasional glass of wine  . Drug use: No     Allergies   Ace inhibitors, Atenolol, Benicar [olmesartan medoxomil], Diltiazem, Hydralazine, Losartan, Metoprolol, Micardis [telmisartan], Penicillins, Verapamil, and Amlodipine   Review of Systems Review of Systems  Constitutional: Negative.   Musculoskeletal: Positive for neck pain.  All other systems reviewed and are negative.    Physical Exam Triage Vital Signs ED Triage Vitals  Enc Vitals Group     BP --      Pulse Rate 11/02/19 0824 (!) 52     Resp --      Temp 11/02/19 0824 97.9 F (36.6 C)     Temp Source 11/02/19 0824 Oral     SpO2 11/02/19 0824 100 %     Weight --      Height --      Head Circumference --      Peak Flow --      Pain Score 11/02/19 0827 8     Pain Loc --      Pain Edu? --      Excl. in [place]? --    No data found.  Updated Vital Signs BP (!) 199/81 (BP Location: Left Arm)   Pulse (!) 52   Temp 97.9 F (36.6 C) (Oral)   Ht 5\' 4"  (1.626 m)   Wt 73.9 kg   SpO2 100%    BMI 27.98 kg/m    Physical Exam Vitals signs and nursing note reviewed.  Constitutional:      General: She is not in acute  distress.    Appearance: Normal appearance. She is normal weight. She is not ill-appearing or toxic-appearing.  HENT:     Head: Normocephalic and atraumatic.     Right Ear: Tympanic membrane normal.     Nose: Nose normal.  Eyes:     Extraocular Movements: Extraocular movements intact.     Conjunctiva/sclera: Conjunctivae normal.     Pupils: Pupils are equal, round, and reactive to light.  Neck:     Musculoskeletal: Normal range of motion and neck supple. Muscular tenderness present.     Comments: Right-sided posterior neck tenderness Cardiovascular:     Rate and Rhythm: Normal rate.  Pulmonary:     Effort: Pulmonary effort is normal.  Skin:    General: Skin is warm and dry.  Neurological:     General: No focal deficit present.     Mental Status: She is alert.     Cranial Nerves: No cranial nerve deficit.  Psychiatric:        Mood and Affect: Mood normal.        Behavior: Behavior normal.      UC Treatments / Results  Labs (all labs ordered are listed, but only abnormal results are displayed) Labs Reviewed - No data to display  EKG   Radiology No results found.  Procedures Procedures (including critical care time)  Medications Ordered in UC Medications - No data to display  Initial Impression / Assessment and Plan / UC Course  I have reviewed the triage vital signs and the nursing notes.  Pertinent labs & imaging results that were available during my care of the patient were reviewed by me and considered in my medical decision making (see chart for details).    Final Clinical Impressions(s) / UC Diagnoses   Final diagnoses:  Torticollis   Discharge Instructions   None    ED Prescriptions    Medication Sig Dispense Auth. Provider   predniSONE (DELTASONE) 50 MG tablet 1 daily with food 3 tablet Robyn Haber, MD     I  have reviewed the PDMP during this encounter.   Robyn Haber, MD 11/02/19 [phone]

## 2019-11-05 ENCOUNTER — Telehealth: Payer: Self-pay

## 2019-11-05 DIAGNOSIS — R519 Headache, unspecified: Secondary | ICD-10-CM

## 2019-11-05 NOTE — Telephone Encounter (Signed)
Patient left msg with Triage stating she thought Dr Madilyn Fireman wanted to order some additional labs for her.   I see where labs were done on 11/9 and were normal.  I did not see any mention in office visit notes from 11/23 stating she needed labs.  Please advise.

## 2019-11-05 NOTE — Telephone Encounter (Signed)
Labs ordered.

## 2019-11-05 NOTE — Telephone Encounter (Signed)
Patient advised.

## 2019-11-06 DIAGNOSIS — R519 Headache, unspecified: Secondary | ICD-10-CM | POA: Diagnosis not present

## 2019-11-06 LAB — CBC WITH DIFFERENTIAL/PLATELET
Absolute Monocytes: 659 cells/uL (ref 200–950)
Basophils Absolute: 30 cells/uL (ref 0–200)
Basophils Relative: 0.4 %
Eosinophils Absolute: 163 cells/uL (ref 15–500)
Eosinophils Relative: 2.2 %
HCT: 39.1 % (ref 35.0–45.0)
Hemoglobin: 13.6 g/dL (ref 11.7–15.5)
Lymphs Abs: 2146 cells/uL (ref 850–3900)
MCH: 32.2 pg (ref 27.0–33.0)
MCHC: 34.8 g/dL (ref 32.0–36.0)
MCV: 92.7 fL (ref 80.0–100.0)
MPV: 12.2 fL (ref 7.5–12.5)
Monocytes Relative: 8.9 %
Neutro Abs: 4403 cells/uL (ref 1500–7800)
Neutrophils Relative %: 59.5 %
Platelets: 288 10*3/uL (ref 140–400)
RBC: 4.22 10*6/uL (ref 3.80–5.10)
RDW: 12.4 % (ref 11.0–15.0)
Total Lymphocyte: 29 %
WBC: 7.4 10*3/uL (ref 3.8–10.8)

## 2019-11-06 LAB — SEDIMENTATION RATE: Sed Rate: 11 mm/h (ref 0–30)

## 2019-12-11 ENCOUNTER — Encounter: Payer: Self-pay | Admitting: Family Medicine

## 2019-12-11 ENCOUNTER — Ambulatory Visit (INDEPENDENT_AMBULATORY_CARE_PROVIDER_SITE_OTHER): Payer: Medicare HMO

## 2019-12-11 ENCOUNTER — Ambulatory Visit (INDEPENDENT_AMBULATORY_CARE_PROVIDER_SITE_OTHER): Payer: Medicare HMO | Admitting: Family Medicine

## 2019-12-11 ENCOUNTER — Other Ambulatory Visit: Payer: Self-pay

## 2019-12-11 VITALS — BP 138/74 | HR 68 | Ht 64.0 in | Wt 165.0 lb

## 2019-12-11 DIAGNOSIS — L299 Pruritus, unspecified: Secondary | ICD-10-CM

## 2019-12-11 DIAGNOSIS — R143 Flatulence: Secondary | ICD-10-CM | POA: Diagnosis not present

## 2019-12-11 DIAGNOSIS — M25531 Pain in right wrist: Secondary | ICD-10-CM | POA: Insufficient documentation

## 2019-12-11 DIAGNOSIS — M19031 Primary osteoarthritis, right wrist: Secondary | ICD-10-CM | POA: Diagnosis not present

## 2019-12-11 DIAGNOSIS — M25532 Pain in left wrist: Secondary | ICD-10-CM | POA: Diagnosis not present

## 2019-12-11 DIAGNOSIS — M25571 Pain in right ankle and joints of right foot: Secondary | ICD-10-CM | POA: Diagnosis not present

## 2019-12-11 NOTE — Assessment & Plan Note (Signed)
Has tried some diet changes and probiotic.  Doesn't eat much dairy.  Will refer to GI.

## 2019-12-11 NOTE — Assessment & Plan Note (Signed)
Likely OA versus tendonitis.  Will get xray today. Will call with results. Ok to use Tylenol

## 2019-12-11 NOTE — Progress Notes (Addendum)
Established Patient Office Visit  Subjective:  Patient ID: Tina Medina, female    DOB: 12-31-1940  Age: 79 y.o. MRN: [id number]  CC:  Chief Complaint  Patient presents with  . Joint Pain    x 2 wks, got worse last wk.she reports that the pain is 10/10 the pain is in her R foot toes and ankle. R wrist pain began last wk and this morning the pain was worse she was not able to wring out a wash cloth, push buttons on microwave or lift anything  . Gas    she said that she gets gasy no matter what she eats    HPI Tina Medina presents for joint pain x 2 wks, she says initially it started in the right ankle and moved down the top of her right foot all the way down to her toes.  She says it lasted for about 2 weeks and was really very painful.  She thought maybe it was just some arthritis she does remember any specific injury or trauma.  She did use Tylenol PM around that time it did help some.  she reports that the pain was 10/10 .  She has had a rash that has been coming and going.  Says it showed up on her left lower leg when she had pain in her right ankle. No fever or chills.  Rash is Itchy.  No history of psoriasis.  Then R wrist pain began last wk and this morning the pain was worse she was not able to wring out a wash cloth, push buttons on microwave or lift anything. She says her wrist hurts mostly with certain activities.  It has never been as painful as it was this morning.  She sometimes gets a little swelling in her distal right forearm and says when she notices that she will usually take her diuretic.  She says she actually took her diuretic yesterday.  Past Medical History:  Diagnosis Date  . DDD (degenerative disc disease), lumbar    L spine, spinal stenosis  . Gastritis    H Pylori  . Hyperlipidemia   . Hypertension   . Lumbar degenerative disc disease 2002   with stenosis  . 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 Menopause    since age 45  .  Migraine   . Osteoma    2 mm of the L lid ethmoid air cells    Past Surgical History:  Procedure Laterality Date  . BREAST SURGERY  1969   Left    Family History  Problem Relation Age of Onset  . Hyperlipidemia Mother   . Hypertension Mother   . Diabetes Mother   . Prostate cancer Father   . Hypertension Brother     Social History   Socioeconomic History  . Marital status: Widowed    Spouse name: Not on file  . Number of children: 3  . Years of education: 61  . Highest education level: Some college, no degree  Occupational History  . Occupation: Retired.      Employer: [employer]    Comment: Retired  Tobacco Use  . Smoking status: Former Smoker    Types: Cigarettes  . Smokeless tobacco: Former Systems developer    Quit date: 12/05/1962  Substance and Sexual Activity  . Alcohol use: Yes    Comment: Occasional glass of wine  . Drug use: No  . Sexual activity: Not Currently  Other Topics Concern  .  Not on file  Social History Narrative  . Not on file   Social Determinants of Health   Financial Resource Strain: Low Risk   . Difficulty of Paying Living Expenses: Not hard at all  Food Insecurity: No Food Insecurity  . Worried About Charity fundraiser in the Last Year: Never true  . Ran Out of Food in the Last Year: Never true  Transportation Needs: No Transportation Needs  . Lack of Transportation (Medical): No  . Lack of Transportation (Non-Medical): No  Physical Activity: Inactive  . Days of Exercise per Week: 0 days  . Minutes of Exercise per Session: 0 min  Stress: No Stress Concern Present  . Feeling of Stress : Not at all  Social Connections: Somewhat Isolated  . Frequency of Communication with Friends and Family: More than three times a week  . Frequency of Social Gatherings with Friends and Family: Once a week  . Attends Religious Services: More than 4 times per year  . Active Member of Clubs or Organizations: No  . Attends Archivist Meetings: Never  .  Marital Status: Widowed  Intimate Partner Violence: Not At Risk  . Fear of Current or Ex-Partner: No  . Emotionally Abused: No  . Physically Abused: No  . Sexually Abused: No    Outpatient Medications Prior to Visit  Medication Sig Dispense Refill  . aspirin 81 MG chewable tablet Chew 81 mg by mouth daily.      . hydrochlorothiazide (MICROZIDE) 12.5 MG capsule Take 1 capsule (12.5 mg total) by mouth daily. 90 capsule 1  . Nebivolol HCl 20 MG TABS Take 1 tablet (20 mg total) by mouth 2 (two) times daily. 60 tablet 5  . rosuvastatin (CRESTOR) 5 MG tablet Take 5 mg by mouth at bedtime.    [name] ketorolac (ACULAR) 0.5 % ophthalmic solution Place 1 drop into both eyes 4 (four) times daily.    . predniSONE (DELTASONE) 50 MG tablet 1 daily with food 3 tablet 1   No facility-administered medications prior to visit.    Allergies  Allergen Reactions  . Ace Inhibitors     REACTION: cough Other reaction(s): Abdominal Pain, Chest Pain Patient develops cough, headaches  . Atenolol   . Benicar [Olmesartan Medoxomil] Other (See Comments)    Chest pain  . Diltiazem Other (See Comments)    Sharp head ache.   . Hydralazine Other (See Comments)    "Chills"   . Losartan Other (See Comments)    headache  . Metoprolol Diarrhea    Abdominal cramping.   . Micardis [Telmisartan] Other (See Comments)    Chest Pain, Back Pain.   [name] Penicillins     REACTION: rash  . Verapamil Other (See Comments)    Headaches and constipation  . Amlodipine Rash    And Headaches    ROS Review of Systems    Objective:    Physical Exam  Constitutional: She is oriented to person, place, and time. She appears well-developed and well-nourished.  HENT:  Head: Normocephalic and atraumatic.  Cardiovascular: Normal rate, regular rhythm and normal heart sounds.  Pulmonary/Chest: Effort normal and breath sounds normal.  Musculoskeletal:     Comments: Right writst w/ NROM, nontender. Strength in the wrist and fingers is  normal.  Left ankle with NROM. No swelling or tenderness.   Neurological: She is alert and oriented to person, place, and time.  Skin: Skin is warm and dry.  Some hyperpigmentation of the anteior left shin.  Psychiatric: She has a normal mood and affect. Her behavior is normal.    BP 138/74   Pulse 68   Ht 5' 4"  (1.626 m)   Wt 165 lb (74.8 kg)   SpO2 100%   BMI 28.32 kg/m  Wt Readings from Last 3 Encounters:  12/11/19 165 lb (74.8 kg)  11/02/19 163 lb (73.9 kg)  03/29/19 154 lb (69.9 kg)     Health Maintenance Due  Topic Date Due  . TETANUS/TDAP  12/05/2014    There are no preventive care reminders to display for this patient.  Lab Results  Component Value Date   TSH 1.45 10/09/2019   Lab Results  Component Value Date   WBC 7.4 11/06/2019   HGB 13.6 11/06/2019   HCT 39.1 11/06/2019   MCV 92.7 11/06/2019   PLT 288 11/06/2019   Lab Results  Component Value Date   NA 142 10/09/2019   K 4.4 10/09/2019   CO2 32 10/09/2019   GLUCOSE 89 10/09/2019   BUN 13 10/09/2019   CREATININE 1.14 (H) 10/09/2019   BILITOT 0.5 10/09/2019   ALKPHOS 66 01/30/2015   AST 20 10/09/2019   ALT 13 10/09/2019   PROT 7.2 10/09/2019   ALBUMIN 4.1 01/30/2015   CALCIUM 9.8 10/09/2019   Lab Results  Component Value Date   CHOL 177 09/03/2018   Lab Results  Component Value Date   HDL 50 (L) 09/03/2018   Lab Results  Component Value Date   LDLCALC 110 (H) 09/03/2018   Lab Results  Component Value Date   TRIG 81 09/03/2018   Lab Results  Component Value Date   CHOLHDL 3.5 09/03/2018   Lab Results  Component Value Date   HGBA1C 5.4 09/03/2018      Assessment & Plan:   Problem List Items Addressed This Visit      Other   Right wrist pain    Likely OA versus tendonitis.  Will get xray today. Will call with results. Ok to use Tylenol       Relevant Orders   DG Wrist Complete Right   Flatulence    Has tried some diet changes and probiotic.  Doesn't eat much dairy.   Will refer to GI.        Relevant Orders   Ambulatory referral to Gastroenterology    Other Visit Diagnoses    Acute right ankle pain    -  Primary   Relevant Orders   CMP with Estimated GFR (CPT-80053)   CBC no Diff   Sed Rate (ESR)   C-reactive protein   ANA   Cyclic citrul peptide antibody, IgG   Rheumatoid factor   Uric acid   Itching       Relevant Orders   CMP with Estimated GFR (CPT-80053)   CBC no Diff   Sed Rate (ESR)   C-reactive protein   ANA   Cyclic citrul peptide antibody, IgG   Rheumatoid factor   Uric acid     Right ankle pain - eval for gout and autoimmune arthritis.  Suspect OA. Since ankle is better today will hold off on OA.  With joint pain and rash will evaluate for autoimmune. Ok to use tylenol. Consider Volteran gel.     No orders of the defined types were placed in this encounter.   Follow-up: Return if symptoms worsen or fail to improve.    Beatrice Lecher, MD

## 2019-12-13 ENCOUNTER — Other Ambulatory Visit: Payer: Self-pay | Admitting: *Deleted

## 2019-12-13 DIAGNOSIS — R143 Flatulence: Secondary | ICD-10-CM

## 2019-12-13 LAB — COMPLETE METABOLIC PANEL WITH GFR
AG Ratio: 1.6 (calc) (ref 1.0–2.5)
ALT: 11 U/L (ref 6–29)
AST: 21 U/L (ref 10–35)
Albumin: 4.2 g/dL (ref 3.6–5.1)
Alkaline phosphatase (APISO): 69 U/L (ref 37–153)
BUN/Creatinine Ratio: 11 (calc) (ref 6–22)
BUN: 12 mg/dL (ref 7–25)
CO2: 34 mmol/L — ABNORMAL HIGH (ref 20–32)
Calcium: 10.2 mg/dL (ref 8.6–10.4)
Chloride: 105 mmol/L (ref 98–110)
Creat: 1.06 mg/dL — ABNORMAL HIGH (ref 0.60–0.88)
GFR, Est African American: 57 mL/min/{1.73_m2} — ABNORMAL LOW (ref >=60)
GFR, Est Non African American: 50 mL/min/{1.73_m2} — ABNORMAL LOW (ref >=60)
Globulin: 2.7 g/dL (calc) (ref 1.9–3.7)
Glucose, Bld: 73 mg/dL (ref 65–99)
Potassium: 4.8 mmol/L (ref 3.5–5.3)
Sodium: 142 mmol/L (ref 135–146)
Total Bilirubin: 0.6 mg/dL (ref 0.2–1.2)
Total Protein: 6.9 g/dL (ref 6.1–8.1)

## 2019-12-13 LAB — SEDIMENTATION RATE: Sed Rate: 14 mm/h (ref 0–30)

## 2019-12-13 LAB — CBC
HCT: 40.4 % (ref 35.0–45.0)
Hemoglobin: 13.8 g/dL (ref 11.7–15.5)
MCH: 32.3 pg (ref 27.0–33.0)
MCHC: 34.2 g/dL (ref 32.0–36.0)
MCV: 94.6 fL (ref 80.0–100.0)
MPV: 11.3 fL (ref 7.5–12.5)
Platelets: 303 10*3/uL (ref 140–400)
RBC: 4.27 10*6/uL (ref 3.80–5.10)
RDW: 12.5 % (ref 11.0–15.0)
WBC: 7.4 10*3/uL (ref 3.8–10.8)

## 2019-12-13 LAB — C-REACTIVE PROTEIN: CRP: 1.3 mg/L (ref <8.0)

## 2019-12-13 LAB — RHEUMATOID FACTOR: Rheumatoid fact SerPl-aCnc: <14 IU/mL (ref <14)

## 2019-12-13 LAB — ANA: Anti Nuclear Antibody (ANA): NEGATIVE

## 2019-12-13 LAB — URIC ACID: Uric Acid, Serum: 5.3 mg/dL (ref 2.5–7.0)

## 2019-12-13 LAB — CYCLIC CITRUL PEPTIDE ANTIBODY, IGG: Cyclic Citrullin Peptide Ab: <16 UNITS

## 2019-12-25 ENCOUNTER — Telehealth: Payer: Self-pay

## 2019-12-25 NOTE — Telephone Encounter (Signed)
Tina Medina called about the COVID-19 vaccine. Advised her to go to the [facility] web site to sign up for the waiting list.   [place] On Tuesday, Jan. 19, [place] and the [facility]) begin large-scale COVID-19 vaccinations at the [employer]. The vaccinations are appointment only and for those 53 and older. Walk-ins will NOT be accepted. All appointments are currently filled. Please join our waiting list for the next available appointments. We will contact you when appointments become available. Please do not sign up more than once. Join Our Waiting List

## 2019-12-26 ENCOUNTER — Other Ambulatory Visit: Payer: Self-pay | Admitting: Family Medicine

## 2020-01-21 ENCOUNTER — Other Ambulatory Visit: Payer: Self-pay | Admitting: Family Medicine

## 2020-01-21 DIAGNOSIS — Z1231 Encounter for screening mammogram for malignant neoplasm of breast: Secondary | ICD-10-CM

## 2020-02-26 ENCOUNTER — Ambulatory Visit: Payer: Medicare HMO

## 2020-02-29 ENCOUNTER — Encounter: Payer: Self-pay | Admitting: Family Medicine

## 2020-03-04 ENCOUNTER — Telehealth: Payer: Self-pay

## 2020-03-04 DIAGNOSIS — R143 Flatulence: Secondary | ICD-10-CM | POA: Diagnosis not present

## 2020-03-04 DIAGNOSIS — R1013 Epigastric pain: Secondary | ICD-10-CM | POA: Diagnosis not present

## 2020-03-04 DIAGNOSIS — R1310 Dysphagia, unspecified: Secondary | ICD-10-CM | POA: Diagnosis not present

## 2020-03-04 DIAGNOSIS — R079 Chest pain, unspecified: Secondary | ICD-10-CM | POA: Diagnosis not present

## 2020-03-04 NOTE — Telephone Encounter (Signed)
Received call from digestive health requesting to make patient an appointment to evaluate some new onset chest pressure upon exertion. I spoke with digestive health and they states they examined patient and no need for urgent care or ER. They recommended patient to follow up with cardiology, but patient wanted to see Dr Madilyn Fireman first.   Patient scheduled tomorrow morning at 9:50. Patient has upcoming procedure with digestive health and will need surgical clearance, I advised [facility] we would likely have to evaluate these new SX, then do a surgical clearance for a different visit. [facility] advised patient of all this information as she was in their office.   FYI to Dr Madilyn Fireman

## 2020-03-05 ENCOUNTER — Encounter: Payer: Self-pay | Admitting: Family Medicine

## 2020-03-05 ENCOUNTER — Ambulatory Visit (INDEPENDENT_AMBULATORY_CARE_PROVIDER_SITE_OTHER): Payer: Medicare HMO | Admitting: Family Medicine

## 2020-03-05 ENCOUNTER — Other Ambulatory Visit: Payer: Self-pay

## 2020-03-05 VITALS — BP 136/74 | HR 61 | Ht 64.0 in | Wt 164.0 lb

## 2020-03-05 DIAGNOSIS — R0789 Other chest pain: Secondary | ICD-10-CM

## 2020-03-05 DIAGNOSIS — R079 Chest pain, unspecified: Secondary | ICD-10-CM

## 2020-03-05 NOTE — Progress Notes (Signed)
Established Patient Office Visit  Subjective:  Patient ID: Tina Medina, female    DOB: 12-13-1940  Age: 79 y.o. MRN: [id number]  CC:  Chief Complaint  Patient presents with  . Chest Pain    HPI Tina Medina is an 79 year old female with a history of hypertension and hyperlipidemia who presents for chest pain on exertion.  She reports that she has been having chest pain with shortness of breath and palpitations with more exertional activities such as mowing her lawn.  She says if she stops and rests it goes away after about 5 minutes.  She says she had a stress test in [place] about 3 years ago.  She reports that at that time it was normal.  She is not having shortness of breath or palpitations outside of these episodes of exertion.  She reported this to the gastroenterologist when she went for evaluation they want to schedule her for an EGD but also want her to get cardiac clearance before she goes.  Past Medical History:  Diagnosis Date  . DDD (degenerative disc disease), lumbar    L spine, spinal stenosis  . Gastritis    H Pylori  . Hyperlipidemia   . Hypertension   . Lumbar degenerative disc disease 2002   with stenosis  . 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 Menopause    since age 24  . Migraine   . Osteoma    2 mm of the L lid ethmoid air cells    Past Surgical History:  Procedure Laterality Date  . BREAST SURGERY  1969   Left    Family History  Problem Relation Age of Onset  . Hyperlipidemia Mother   . Hypertension Mother   . Diabetes Mother   . Prostate cancer Father   . Hypertension Brother     Social History   Socioeconomic History  . Marital status: Widowed    Spouse name: Not on file  . Number of children: 3  . Years of education: 76  . Highest education level: Some college, no degree  Occupational History  . Occupation: Retired.      Employer: [employer]    Comment: Retired  Tobacco Use  . Smoking status: Former Smoker     Types: Cigarettes  . Smokeless tobacco: Former Systems developer    Quit date: 12/05/1962  Substance and Sexual Activity  . Alcohol use: Yes    Comment: Occasional glass of wine  . Drug use: No  . Sexual activity: Not Currently  Other Topics Concern  . Not on file  Social History Narrative  . Not on file   Social Determinants of Health   Financial Resource Strain: Low Risk   . Difficulty of Paying Living Expenses: Not hard at all  Food Insecurity: No Food Insecurity  . Worried About Charity fundraiser in the Last Year: Never true  . Ran Out of Food in the Last Year: Never true  Transportation Needs: No Transportation Needs  . Lack of Transportation (Medical): No  . Lack of Transportation (Non-Medical): No  Physical Activity: Inactive  . Days of Exercise per Week: 0 days  . Minutes of Exercise per Session: 0 min  Stress: No Stress Concern Present  . Feeling of Stress : Not at all  Social Connections: Somewhat Isolated  . Frequency of Communication with Friends and Family: More than three times a week  . Frequency of Social Gatherings with Friends and Family:  Once a week  . Attends Religious Services: More than 4 times per year  . Active Member of Clubs or Organizations: No  . Attends Archivist Meetings: Never  . Marital Status: Widowed  Intimate Partner Violence: Not At Risk  . Fear of Current or Ex-Partner: No  . Emotionally Abused: No  . Physically Abused: No  . Sexually Abused: No    Outpatient Medications Prior to Visit  Medication Sig Dispense Refill  . aspirin 81 MG chewable tablet Chew 81 mg by mouth daily.      [name] BYSTOLIC 20 MG TABS TAKE 1 TABLET BY MOUTH TWICE A DAY 60 tablet 5  . hydrochlorothiazide (MICROZIDE) 12.5 MG capsule Take 1 capsule (12.5 mg total) by mouth daily. 90 capsule 1  . rosuvastatin (CRESTOR) 5 MG tablet Take 5 mg by mouth at bedtime.     No facility-administered medications prior to visit.    Allergies  Allergen Reactions  . Ace  Inhibitors     REACTION: cough Other reaction(s): Abdominal Pain, Chest Pain Patient develops cough, headaches  . Atenolol   . Benicar [Olmesartan Medoxomil] Other (See Comments)    Chest pain  . Diltiazem Other (See Comments)    Sharp head ache.   . Hydralazine Other (See Comments)    "Chills"   . Losartan Other (See Comments)    headache  . Metoprolol Diarrhea    Abdominal cramping.   . Micardis [Telmisartan] Other (See Comments)    Chest Pain, Back Pain.   [name] Penicillins     REACTION: rash  . Verapamil Other (See Comments)    Headaches and constipation  . Amlodipine Rash    And Headaches    ROS Review of Systems    Objective:    Physical Exam  Constitutional: She is oriented to person, place, and time. She appears well-developed and well-nourished.  HENT:  Head: Normocephalic and atraumatic.  Eyes: Conjunctivae are normal.  Cardiovascular: Normal rate, regular rhythm and normal heart sounds.  Pulmonary/Chest: Effort normal and breath sounds normal.  Neurological: She is alert and oriented to person, place, and time.  Skin: Skin is warm and dry.  Psychiatric: She has a normal mood and affect. Her behavior is normal.    BP 136/74   Pulse 61   Ht 5\' 4"  (1.626 m)   Wt 164 lb (74.4 kg)   SpO2 97%   BMI 28.15 kg/m  Wt Readings from Last 3 Encounters:  03/05/20 164 lb (74.4 kg)  12/11/19 165 lb (74.8 kg)  11/02/19 163 lb (73.9 kg)     There are no preventive care reminders to display for this patient.  There are no preventive care reminders to display for this patient.  Lab Results  Component Value Date   TSH 1.45 10/09/2019   Lab Results  Component Value Date   WBC 7.4 12/11/2019   HGB 13.8 12/11/2019   HCT 40.4 12/11/2019   MCV 94.6 12/11/2019   PLT 303 12/11/2019   Lab Results  Component Value Date   NA 142 12/11/2019   K 4.8 12/11/2019   CO2 34 (H) 12/11/2019   GLUCOSE 73 12/11/2019   BUN 12 12/11/2019   CREATININE 1.06 (H) 12/11/2019    BILITOT 0.6 12/11/2019   ALKPHOS 66 01/30/2015   AST 21 12/11/2019   ALT 11 12/11/2019   PROT 6.9 12/11/2019   ALBUMIN 4.1 01/30/2015   CALCIUM 10.2 12/11/2019   Lab Results  Component Value Date   CHOL 177  09/03/2018   Lab Results  Component Value Date   HDL 50 (L) 09/03/2018   Lab Results  Component Value Date   LDLCALC 110 (H) 09/03/2018   Lab Results  Component Value Date   TRIG 81 09/03/2018   Lab Results  Component Value Date   CHOLHDL 3.5 09/03/2018   Lab Results  Component Value Date   HGBA1C 5.4 09/03/2018      Assessment & Plan:   Problem List Items Addressed This Visit    None    Visit Diagnoses    Atypical chest pain    -  Primary   Chest pain, unspecified type       Relevant Orders   EKG 12-Lead     Chest pain with exertion with concomitant shortness of breath and palpitations that resolves after about 5 minutes.  Is concerning for possible coronary artery disease she does have history of hypertension and hyperlipidemia.  This is like she had a normal stress test about 3 years ago stored in a call and try to get a copy of that report.  She just had labs done in January.  BC was normal.  No sign of anemia.  Her thyroid looked great 5 months ago.  Metabolic panel was okay except for chronic renal insufficiency.  EKG today shows rate of 53 bpm, sinus bradycardia.    No orders of the defined types were placed in this encounter.   Follow-up: Return if symptoms worsen or fail to improve.    Beatrice Lecher, MD

## 2020-03-13 ENCOUNTER — Telehealth: Payer: Self-pay

## 2020-03-13 NOTE — Telephone Encounter (Signed)
Keymani wanted to know if Dr Madilyn Fireman received the information from the Cardiologist.

## 2020-03-18 NOTE — Telephone Encounter (Signed)
I have not received any additional information from previous cardiologist in [place].  We will need to have them refax stress test from about 3 years ago please see office visit note.  I went through my basket yesterday and did not see anything.

## 2020-03-20 NOTE — Telephone Encounter (Signed)
Left message for a return call

## 2020-03-23 NOTE — Telephone Encounter (Signed)
Re-faxed the request and circled the need for images or other cardiac tests.

## 2020-04-03 ENCOUNTER — Telehealth: Payer: Self-pay | Admitting: Family Medicine

## 2020-04-03 DIAGNOSIS — I270 Primary pulmonary hypertension: Secondary | ICD-10-CM | POA: Insufficient documentation

## 2020-04-03 DIAGNOSIS — I209 Angina pectoris, unspecified: Secondary | ICD-10-CM

## 2020-04-03 NOTE — Telephone Encounter (Signed)
Call pt: the Cardiologist in [facility] sent [place] a copy of your Echo, US of the heart from 12/2017.  It showed normal pumping.  You did have some enlargement of the ventricle which comes from BP being too high.  Overall nothing too worrisome. I would like to consider a heart monitor for when she is feeling the palpitations and SOB with activity to check the rhythm of the heart.

## 2020-04-03 NOTE — Telephone Encounter (Signed)
See other telephone message about Echo in 2019.

## 2020-04-03 NOTE — Telephone Encounter (Signed)
Kynzley wanted to know if it was ok to proceed with endoscopy.   Patient advised of recommendations. She declines a heart monitor at this moment. She will call if she feels she needs to have it done in the future.

## 2020-04-03 NOTE — Telephone Encounter (Signed)
OK to do her endoscopy

## 2020-04-03 NOTE — Telephone Encounter (Signed)
Patient advised.

## 2020-04-06 DIAGNOSIS — Z0100 Encounter for examination of eyes and vision without abnormal findings: Secondary | ICD-10-CM | POA: Diagnosis not present

## 2020-04-07 DIAGNOSIS — R1013 Epigastric pain: Secondary | ICD-10-CM | POA: Diagnosis not present

## 2020-04-07 DIAGNOSIS — K219 Gastro-esophageal reflux disease without esophagitis: Secondary | ICD-10-CM | POA: Diagnosis not present

## 2020-04-07 DIAGNOSIS — K2950 Unspecified chronic gastritis without bleeding: Secondary | ICD-10-CM | POA: Diagnosis not present

## 2020-04-15 ENCOUNTER — Ambulatory Visit (INDEPENDENT_AMBULATORY_CARE_PROVIDER_SITE_OTHER): Payer: Medicare HMO

## 2020-04-15 ENCOUNTER — Other Ambulatory Visit: Payer: Self-pay

## 2020-04-15 DIAGNOSIS — Z1231 Encounter for screening mammogram for malignant neoplasm of breast: Secondary | ICD-10-CM

## 2020-04-18 ENCOUNTER — Encounter: Payer: Self-pay | Admitting: Family Medicine

## 2020-05-06 ENCOUNTER — Ambulatory Visit: Payer: Medicare HMO

## 2020-05-13 ENCOUNTER — Ambulatory Visit (INDEPENDENT_AMBULATORY_CARE_PROVIDER_SITE_OTHER): Payer: Medicare HMO | Admitting: Family Medicine

## 2020-05-13 ENCOUNTER — Encounter: Payer: Self-pay | Admitting: Family Medicine

## 2020-05-13 VITALS — BP 160/72 | HR 56 | Ht 64.0 in | Wt 160.0 lb

## 2020-05-13 DIAGNOSIS — H539 Unspecified visual disturbance: Secondary | ICD-10-CM

## 2020-05-13 DIAGNOSIS — I209 Angina pectoris, unspecified: Secondary | ICD-10-CM

## 2020-05-13 DIAGNOSIS — N1831 Chronic kidney disease, stage 3a: Secondary | ICD-10-CM | POA: Diagnosis not present

## 2020-05-13 DIAGNOSIS — N183 Chronic kidney disease, stage 3 unspecified: Secondary | ICD-10-CM | POA: Insufficient documentation

## 2020-05-13 DIAGNOSIS — I10 Essential (primary) hypertension: Secondary | ICD-10-CM

## 2020-05-13 NOTE — Progress Notes (Signed)
Established Patient Office Visit  Subjective:  Patient ID: Tina Medina, female    DOB: February 16, 1941  Age: 79 y.o. MRN: [id number]  CC:  Chief Complaint  Patient presents with  . Headache    HPI Tina Medina presents for headaches.  They mostly been in the back of her head.  They usually last for a few minutes and she describes it as a sharp pain.  Happens about once a month on average sometimes a little more frequently.    She says she also is concerned because she feels like she is in a "fog".  She feels like it is worse when she puts on her glasses.  She says that she has been twice to have her glasses checked and recently had her prescription updated but it still does not seem to make a difference.  She says even with reading she will eventually just have to stop reading because things start to look off and blurry.  She is noticing that when she is working in the yard she feels like her perception of things is off just slightly and that has caused her to almost fall or make a couple mistakes.  She says she does not notice it when she is in the house images when she is doing yard work.  It is been bad enough that she is actually going to give up doing any yard work.  She did have a brain MRI about a year ago in April 2020 which was essentially normal except for some chronic microvascular ischemic changes and we reviewed that information today.  She also wanted to discuss her recent kidney function results.  Her creatinine had been around 0.9 for at least the last 6 years or more and recently has bumped up to 1.0-1.1.  Hypertension- Pt denies chest pain, SOB, dizziness, or heart palpitations.  Taking meds as directed w/o problems.  Denies medication side effects.      Past Medical History:  Diagnosis Date  . DDD (degenerative disc disease), lumbar    L spine, spinal stenosis  . Gastritis    H Pylori  . Hyperlipidemia   . Hypertension   . Lumbar degenerative disc disease 2002   with stenosis  . 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 Menopause    since age 30  . Migraine   . Osteoma    2 mm of the L lid ethmoid air cells    Past Surgical History:  Procedure Laterality Date  . BREAST BIOPSY Left   . BREAST EXCISIONAL BIOPSY Left   . BREAST SURGERY  1969   Left    Family History  Problem Relation Age of Onset  . Hyperlipidemia Mother   . Hypertension Mother   . Diabetes Mother   . Prostate cancer Father   . Hypertension Brother     Social History   Socioeconomic History  . Marital status: Widowed    Spouse name: Not on file  . Number of children: 3  . Years of education: 61  . Highest education level: Some college, no degree  Occupational History  . Occupation: Retired.      Employer: [employer]    Comment: Retired  Tobacco Use  . Smoking status: Former Smoker    Types: Cigarettes  . Smokeless tobacco: Former Systems developer    Quit date: 12/05/1962  Substance and Sexual Activity  . Alcohol use: Yes    Comment: Occasional glass of wine  .  Drug use: No  . Sexual activity: Not Currently  Other Topics Concern  . Not on file  Social History Narrative  . Not on file   Social Determinants of Health   Financial Resource Strain: Low Risk   . Difficulty of Paying Living Expenses: Not hard at all  Food Insecurity: No Food Insecurity  . Worried About Charity fundraiser in the Last Year: Never true  . Ran Out of Food in the Last Year: Never true  Transportation Needs: No Transportation Needs  . Lack of Transportation (Medical): No  . Lack of Transportation (Non-Medical): No  Physical Activity: Inactive  . Days of Exercise per Week: 0 days  . Minutes of Exercise per Session: 0 min  Stress: No Stress Concern Present  . Feeling of Stress : Not at all  Social Connections: Somewhat Isolated  . Frequency of Communication with Friends and Family: More than three times a week  . Frequency of Social Gatherings with Friends and Family:  Once a week  . Attends Religious Services: More than 4 times per year  . Active Member of Clubs or Organizations: No  . Attends Archivist Meetings: Never  . Marital Status: Widowed  Intimate Partner Violence: Not At Risk  . Fear of Current or Ex-Partner: No  . Emotionally Abused: No  . Physically Abused: No  . Sexually Abused: No    Outpatient Medications Prior to Visit  Medication Sig Dispense Refill  . aspirin 81 MG chewable tablet Chew 81 mg by mouth daily.      [name] BYSTOLIC 20 MG TABS TAKE 1 TABLET BY MOUTH TWICE A DAY 60 tablet 5  . hydrochlorothiazide (MICROZIDE) 12.5 MG capsule Take 1 capsule (12.5 mg total) by mouth daily. 90 capsule 1  . pantoprazole (PROTONIX) 40 MG tablet     . rosuvastatin (CRESTOR) 5 MG tablet Take 5 mg by mouth at bedtime.     No facility-administered medications prior to visit.    Allergies  Allergen Reactions  . Ace Inhibitors     REACTION: cough Other reaction(s): Abdominal Pain, Chest Pain Patient develops cough, headaches  . Atenolol   . Benicar [Olmesartan Medoxomil] Other (See Comments)    Chest pain  . Diltiazem Other (See Comments)    Sharp head ache.   . Hydralazine Other (See Comments)    "Chills"   . Losartan Other (See Comments)    headache  . Metoprolol Diarrhea    Abdominal cramping.   . Micardis [Telmisartan] Other (See Comments)    Chest Pain, Back Pain.   [name] Penicillins     REACTION: rash  . Verapamil Other (See Comments)    Headaches and constipation  . Amlodipine Rash    And Headaches    ROS Review of Systems    Objective:    Physical Exam  Constitutional: She is oriented to person, place, and time. She appears well-developed and well-nourished.  HENT:  Head: Normocephalic and atraumatic.  Cardiovascular: Normal rate, regular rhythm and normal heart sounds.  Pulmonary/Chest: Effort normal and breath sounds normal.  Neurological: She is alert and oriented to person, place, and time.  Skin: Skin is  warm and dry.  Psychiatric: She has a normal mood and affect. Her behavior is normal.    BP (!) 160/72   Pulse (!) 56   Ht 5\' 4"  (1.626 m)   Wt 160 lb (72.6 kg)   SpO2 98%   BMI 27.46 kg/m  Wt Readings from Last 3  Encounters:  05/13/20 160 lb (72.6 kg)  03/05/20 164 lb (74.4 kg)  12/11/19 165 lb (74.8 kg)     There are no preventive care reminders to display for this patient.  There are no preventive care reminders to display for this patient.  Lab Results  Component Value Date   TSH 1.45 10/09/2019   Lab Results  Component Value Date   WBC 7.4 12/11/2019   HGB 13.8 12/11/2019   HCT 40.4 12/11/2019   MCV 94.6 12/11/2019   PLT 303 12/11/2019   Lab Results  Component Value Date   NA 142 12/11/2019   K 4.8 12/11/2019   CO2 34 (H) 12/11/2019   GLUCOSE 73 12/11/2019   BUN 12 12/11/2019   CREATININE 1.06 (H) 12/11/2019   BILITOT 0.6 12/11/2019   ALKPHOS 66 01/30/2015   AST 21 12/11/2019   ALT 11 12/11/2019   PROT 6.9 12/11/2019   ALBUMIN 4.1 01/30/2015   CALCIUM 10.2 12/11/2019   Lab Results  Component Value Date   CHOL 177 09/03/2018   Lab Results  Component Value Date   HDL 50 (L) 09/03/2018   Lab Results  Component Value Date   LDLCALC 110 (H) 09/03/2018   Lab Results  Component Value Date   TRIG 81 09/03/2018   Lab Results  Component Value Date   CHOLHDL 3.5 09/03/2018   Lab Results  Component Value Date   HGBA1C 5.4 09/03/2018      Assessment & Plan:   Problem List Items Addressed This Visit      Cardiovascular and Mediastinum   ESSENTIAL HYPERTENSION, BENIGN - Primary    Not well controlled.  Could be contributing to her HA.  BP in April at goal on same regimen. Will keep an eye on this. Recheck at f/u.      Relevant Orders   BASIC METABOLIC PANEL WITH GFR   Angina pectoris (HCC)    No recent chest pain.        Genitourinary   CKD (chronic kidney disease) stage 3, GFR 30-59 ml/min    Due to recheck renal function. Was  previously 0.9. now around 1.0-1.1 in the last year.       Relevant Orders   BASIC METABOLIC PANEL WITH GFR    Other Visit Diagnoses    Vision changes       Relevant Orders   Ambulatory referral to Ophthalmology      Vision changes - having what sound like depth perception issues. Recommend consult with opthalmology  Brain MRI was normal last year.  Referral placed.  Prism glasses?   No orders of the defined types were placed in this encounter.   Follow-up: Return in about 4 months (around 09/12/2020) for Hypertension.    Beatrice Lecher, MD

## 2020-05-13 NOTE — Assessment & Plan Note (Addendum)
Not well controlled.  Could be contributing to her HA.  BP in April at goal on same regimen. Will keep an eye on this. Recheck at f/u.

## 2020-05-13 NOTE — Progress Notes (Signed)
Pt reports that 

## 2020-05-13 NOTE — Assessment & Plan Note (Signed)
No recent chest pain  

## 2020-05-13 NOTE — Assessment & Plan Note (Signed)
Due to recheck renal function. Was previously 0.9. now around 1.0-1.1 in the last year.

## 2020-05-26 DIAGNOSIS — I10 Essential (primary) hypertension: Secondary | ICD-10-CM | POA: Diagnosis not present

## 2020-05-26 DIAGNOSIS — N1831 Chronic kidney disease, stage 3a: Secondary | ICD-10-CM | POA: Diagnosis not present

## 2020-05-26 LAB — BASIC METABOLIC PANEL WITH GFR
BUN/Creatinine Ratio: 14 (calc) (ref 6–22)
BUN: 15 mg/dL (ref 7–25)
CO2: 30 mmol/L (ref 20–32)
Calcium: 9.8 mg/dL (ref 8.6–10.4)
Chloride: 106 mmol/L (ref 98–110)
Creat: 1.08 mg/dL — ABNORMAL HIGH (ref 0.60–0.88)
GFR, Est African American: 56 mL/min/{1.73_m2} — ABNORMAL LOW (ref >=60)
GFR, Est Non African American: 48 mL/min/{1.73_m2} — ABNORMAL LOW (ref >=60)
Glucose, Bld: 80 mg/dL (ref 65–139)
Potassium: 4.8 mmol/L (ref 3.5–5.3)
Sodium: 141 mmol/L (ref 135–146)

## 2020-06-01 ENCOUNTER — Encounter: Payer: Medicare HMO | Admitting: Sports Medicine

## 2020-06-04 ENCOUNTER — Encounter: Payer: Self-pay | Admitting: Family Medicine

## 2020-06-04 DIAGNOSIS — H26491 Other secondary cataract, right eye: Secondary | ICD-10-CM | POA: Diagnosis not present

## 2020-06-04 DIAGNOSIS — H0100B Unspecified blepharitis left eye, upper and lower eyelids: Secondary | ICD-10-CM | POA: Diagnosis not present

## 2020-06-04 DIAGNOSIS — H0100A Unspecified blepharitis right eye, upper and lower eyelids: Secondary | ICD-10-CM | POA: Diagnosis not present

## 2020-06-04 DIAGNOSIS — H527 Unspecified disorder of refraction: Secondary | ICD-10-CM | POA: Diagnosis not present

## 2020-06-19 ENCOUNTER — Ambulatory Visit (INDEPENDENT_AMBULATORY_CARE_PROVIDER_SITE_OTHER): Payer: Medicare HMO

## 2020-06-19 ENCOUNTER — Other Ambulatory Visit: Payer: Self-pay

## 2020-06-19 ENCOUNTER — Ambulatory Visit (INDEPENDENT_AMBULATORY_CARE_PROVIDER_SITE_OTHER): Payer: Medicare HMO | Admitting: Family Medicine

## 2020-06-19 ENCOUNTER — Encounter: Payer: Self-pay | Admitting: Family Medicine

## 2020-06-19 VITALS — BP 158/72 | HR 59 | Ht 64.0 in | Wt 161.0 lb

## 2020-06-19 DIAGNOSIS — M545 Low back pain: Secondary | ICD-10-CM

## 2020-06-19 DIAGNOSIS — G8929 Other chronic pain: Secondary | ICD-10-CM

## 2020-06-19 DIAGNOSIS — M25562 Pain in left knee: Secondary | ICD-10-CM | POA: Diagnosis not present

## 2020-06-19 DIAGNOSIS — M1712 Unilateral primary osteoarthritis, left knee: Secondary | ICD-10-CM | POA: Diagnosis not present

## 2020-06-19 NOTE — Progress Notes (Signed)
Established Patient Office Visit  Subjective:  Patient ID: Tina Medina, female    DOB: 05-17-1941  Age: 79 y.o. MRN: [id number]  CC:  Chief Complaint  Patient presents with  . Knee Pain    HPI Tina Medina presents for left knee pain x 2 days after working int he yard. Having to use a cane [place].  . Is not taking anything orally she just does take her aspirin daily.  She says the knee is really only painful if she bears weight.  If she is sitting and at rest it feels fine.  She did have a consult with digestive health on May 4 for epigastric pain and dysphagia.  She says she noticed that she is getting some epigastric discomfort when she flexes or bends forward.  Its not after eating or when she does not eat.  It is really just when she crunches the stomach forward that she notices some discomfort she has not noticed a bulge in the abdominal wall such as a hernia.  She also reports a history of spinal stenosis and says that she has been noticing some intermittent right leg weakness she wonders if it could be related.  She says the leg will sometimes give out on her and then sometimes she will have significant pain in the toes.  Patient actually mentioned this to me as she was leaving and at already left the patient room.  Past Medical History:  Diagnosis Date  . DDD (degenerative disc disease), lumbar    L spine, spinal stenosis  . Gastritis    H Pylori  . Hyperlipidemia   . Hypertension   . Lumbar degenerative disc disease 2002   with stenosis  . 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 Menopause    since age 44  . Migraine   . Osteoma    2 mm of the L lid ethmoid air cells    Past Surgical History:  Procedure Laterality Date  . BREAST BIOPSY Left   . BREAST EXCISIONAL BIOPSY Left   . BREAST SURGERY  1969   Left    Family History  Problem Relation Age of Onset  . Hyperlipidemia Mother   . Hypertension Mother   . Diabetes Mother   .  Prostate cancer Father   . Hypertension Brother     Social History   Socioeconomic History  . Marital status: Widowed    Spouse name: Not on file  . Number of children: 3  . Years of education: 65  . Highest education level: Some college, no degree  Occupational History  . Occupation: Retired.      Employer: [employer]    Comment: Retired  Tobacco Use  . Smoking status: Former Smoker    Types: Cigarettes  . Smokeless tobacco: Former Systems developer    Quit date: 12/05/1962  Vaping Use  . Vaping Use: Never used  Substance and Sexual Activity  . Alcohol use: Yes    Comment: Occasional glass of wine  . Drug use: No  . Sexual activity: Not Currently  Other Topics Concern  . Not on file  Social History Narrative  . Not on file   Social Determinants of Health   Financial Resource Strain: Low Risk   . Difficulty of Paying Living Expenses: Not hard at all  Food Insecurity: No Food Insecurity  . Worried About Charity fundraiser in the Last Year: Never true  . Ran Out of  Food in the Last Year: Never true  Transportation Needs: No Transportation Needs  . Lack of Transportation (Medical): No  . Lack of Transportation (Non-Medical): No  Physical Activity: Inactive  . Days of Exercise per Week: 0 days  . Minutes of Exercise per Session: 0 min  Stress: No Stress Concern Present  . Feeling of Stress : Not at all  Social Connections: Moderately Isolated  . Frequency of Communication with Friends and Family: More than three times a week  . Frequency of Social Gatherings with Friends and Family: Once a week  . Attends Religious Services: More than 4 times per year  . Active Member of Clubs or Organizations: No  . Attends Archivist Meetings: Never  . Marital Status: Widowed  Intimate Partner Violence: Not At Risk  . Fear of Current or Ex-Partner: No  . Emotionally Abused: No  . Physically Abused: No  . Sexually Abused: No    Outpatient Medications Prior to Visit  Medication Sig  Dispense Refill  . aspirin 81 MG chewable tablet Chew 81 mg by mouth daily.      [name] BYSTOLIC 20 MG TABS TAKE 1 TABLET BY MOUTH TWICE A DAY 60 tablet 5  . esomeprazole (NEXIUM) 20 MG capsule Take 20 mg by mouth daily at 12 noon.    . hydrochlorothiazide (MICROZIDE) 12.5 MG capsule Take 1 capsule (12.5 mg total) by mouth daily. 90 capsule 1  . rosuvastatin (CRESTOR) 5 MG tablet Take 5 mg by mouth at bedtime.    . pantoprazole (PROTONIX) 40 MG tablet      No facility-administered medications prior to visit.    Allergies  Allergen Reactions  . Ace Inhibitors     REACTION: cough Other reaction(s): Abdominal Pain, Chest Pain Patient develops cough, headaches  . Atenolol   . Benicar [Olmesartan Medoxomil] Other (See Comments)    Chest pain  . Diltiazem Other (See Comments)    Sharp head ache.   . Hydralazine Other (See Comments)    "Chills"   . Losartan Other (See Comments)    headache  . Metoprolol Diarrhea    Abdominal cramping.   . Micardis [Telmisartan] Other (See Comments)    Chest Pain, Back Pain.   [name] Penicillins     REACTION: rash  . Verapamil Other (See Comments)    Headaches and constipation  . Amlodipine Rash    And Headaches    ROS Review of Systems    Objective:    Physical Exam Vitals reviewed.  Constitutional:      Appearance: She is well-developed.  HENT:     Head: Normocephalic and atraumatic.  Eyes:     Conjunctiva/sclera: Conjunctivae normal.  Cardiovascular:     Rate and Rhythm: Normal rate.  Pulmonary:     Effort: Pulmonary effort is normal.  Abdominal:     General: Abdomen is flat. There is no distension.     Palpations: Abdomen is soft.     Tenderness: There is no abdominal tenderness.     Hernia: No hernia is present.  Musculoskeletal:     Comments: Left knee with trace swelling.  Normal flexion and extension.  She points to the patellar tendon area just at the base of the patella as there area of most pain.  She was nontender on exam over  the patella or joint lines.  No significant crepitus.  Negative McMurray's.  He did have some pain when she tried to straighten her leg on her own.  Skin:  General: Skin is dry.     Coloration: Skin is not pale.  Neurological:     Mental Status: She is alert and oriented to person, place, and time.  Psychiatric:        Behavior: Behavior normal.     BP (!) 158/72   Pulse (!) 59   Ht 5\' 4"  (1.626 m)   Wt 161 lb (73 kg)   SpO2 99%   BMI 27.64 kg/m  Wt Readings from Last 3 Encounters:  06/19/20 161 lb (73 kg)  05/13/20 160 lb (72.6 kg)  03/05/20 164 lb (74.4 kg)     There are no preventive care reminders to display for this patient.  There are no preventive care reminders to display for this patient.  Lab Results  Component Value Date   TSH 1.45 10/09/2019   Lab Results  Component Value Date   WBC 7.4 12/11/2019   HGB 13.8 12/11/2019   HCT 40.4 12/11/2019   MCV 94.6 12/11/2019   PLT 303 12/11/2019   Lab Results  Component Value Date   NA 141 05/26/2020   K 4.8 05/26/2020   CO2 30 05/26/2020   GLUCOSE 80 05/26/2020   BUN 15 05/26/2020   CREATININE 1.08 (H) 05/26/2020   BILITOT 0.6 12/11/2019   ALKPHOS 66 01/30/2015   AST 21 12/11/2019   ALT 11 12/11/2019   PROT 6.9 12/11/2019   ALBUMIN 4.1 01/30/2015   CALCIUM 9.8 05/26/2020   Lab Results  Component Value Date   CHOL 177 09/03/2018   Lab Results  Component Value Date   HDL 50 (L) 09/03/2018   Lab Results  Component Value Date   LDLCALC 110 (H) 09/03/2018   Lab Results  Component Value Date   TRIG 81 09/03/2018   Lab Results  Component Value Date   CHOLHDL 3.5 09/03/2018   Lab Results  Component Value Date   HGBA1C 5.4 09/03/2018      Assessment & Plan:   Problem List Items Addressed This Visit    None    Visit Diagnoses    Acute pain of left knee    -  Primary   Relevant Orders   DG Knee Complete 4 Views Left (Completed)   Chronic midline low back pain without sciatica        Relevant Orders   DG Lumbar Spine Complete (Completed)     Acute left knee pain with sudden onset of pain will get x-ray today for further work-up.  Suspect that it may just be osteoarthritis.  She would do with Tylenol and ice rest and elevation continue with compression sleeve.  Will call with results once available.  Right leg weakness  - will start with x-rays.  Could consider MRI for further evaluation.  Does report a history of spinal stenosis.  The last imaging I have on her for her low back was years ago.  Epigastric fullness -unclear why she has discomfort when she flexes forward in the epigastric area I did not palpate any mass or lesion I did not appreciate any abdominal wall hernia.  Consider that she could have a hiatal hernia.  No orders of the defined types were placed in this encounter.   Follow-up: Return if symptoms worsen or fail to improve.    Beatrice Lecher, MD

## 2020-07-06 ENCOUNTER — Encounter: Payer: Self-pay | Admitting: Family Medicine

## 2020-07-07 ENCOUNTER — Encounter: Payer: Self-pay | Admitting: Family Medicine

## 2020-09-10 ENCOUNTER — Ambulatory Visit (INDEPENDENT_AMBULATORY_CARE_PROVIDER_SITE_OTHER): Payer: Medicare HMO

## 2020-09-10 ENCOUNTER — Other Ambulatory Visit: Payer: Self-pay

## 2020-09-10 ENCOUNTER — Ambulatory Visit (INDEPENDENT_AMBULATORY_CARE_PROVIDER_SITE_OTHER): Payer: Medicare HMO | Admitting: Family Medicine

## 2020-09-10 ENCOUNTER — Encounter: Payer: Self-pay | Admitting: Family Medicine

## 2020-09-10 VITALS — BP 160/62 | HR 61 | Ht 64.0 in | Wt 163.0 lb

## 2020-09-10 DIAGNOSIS — R1013 Epigastric pain: Secondary | ICD-10-CM | POA: Diagnosis not present

## 2020-09-10 DIAGNOSIS — R0789 Other chest pain: Secondary | ICD-10-CM

## 2020-09-10 DIAGNOSIS — R194 Change in bowel habit: Secondary | ICD-10-CM | POA: Diagnosis not present

## 2020-09-10 DIAGNOSIS — K529 Noninfective gastroenteritis and colitis, unspecified: Secondary | ICD-10-CM | POA: Diagnosis not present

## 2020-09-10 DIAGNOSIS — K2100 Gastro-esophageal reflux disease with esophagitis, without bleeding: Secondary | ICD-10-CM

## 2020-09-10 DIAGNOSIS — Z23 Encounter for immunization: Secondary | ICD-10-CM

## 2020-09-10 DIAGNOSIS — R195 Other fecal abnormalities: Secondary | ICD-10-CM | POA: Diagnosis not present

## 2020-09-10 DIAGNOSIS — R198 Other specified symptoms and signs involving the digestive system and abdomen: Secondary | ICD-10-CM

## 2020-09-10 DIAGNOSIS — I209 Angina pectoris, unspecified: Secondary | ICD-10-CM | POA: Diagnosis not present

## 2020-09-10 DIAGNOSIS — I10 Essential (primary) hypertension: Secondary | ICD-10-CM | POA: Diagnosis not present

## 2020-09-10 DIAGNOSIS — M47816 Spondylosis without myelopathy or radiculopathy, lumbar region: Secondary | ICD-10-CM | POA: Diagnosis not present

## 2020-09-10 DIAGNOSIS — N1831 Chronic kidney disease, stage 3a: Secondary | ICD-10-CM

## 2020-09-10 DIAGNOSIS — E785 Hyperlipidemia, unspecified: Secondary | ICD-10-CM | POA: Diagnosis not present

## 2020-09-10 MED ORDER — ESOMEPRAZOLE MAGNESIUM 40 MG PO CPDR: 40.0000 mg | DELAYED_RELEASE_CAPSULE | Freq: Two times a day (BID) | ORAL | 1 refills | Status: DC

## 2020-09-10 NOTE — Assessment & Plan Note (Signed)
[name] better today but still not well controlled.  She has multiple intolerances to blood pressure medications.

## 2020-09-10 NOTE — Assessment & Plan Note (Signed)
For the epigastric pain lets increase her Nexium to 40 mg which is the prescription strength and have you take it twice a day for the next 4 weeks to see if this helps if it is helping then after 4 weeks you can try dropping down to once a day and see if it still able to maintain.  If it is not improving then I highly recommend that she follow back up with digestive health specialist since she just had her scope done in May.

## 2020-09-10 NOTE — Patient Instructions (Signed)
For the epigastric pain lets increase her Nexium to 40 mg which is the prescription strength and have you take it twice a day for the next 4 weeks to see if this helps if it is helping then after 4 weeks you can try dropping down to once a day and see if it still able to maintain.  If it is not improving then I highly recommend that she follow back up with digestive health specialist since she just had her scope done in May.

## 2020-09-10 NOTE — Assessment & Plan Note (Signed)
Due tor recheck lipids.

## 2020-09-10 NOTE — Assessment & Plan Note (Addendum)
Had recurring chest pain for years she actually had a nuclear stress test when she was living in [place] that was neg.  I do believe we have a copy of that in the records.  She had an echocardiogram in 2019.  I do feel that her chest pain is probably just angina but I would like to send her to cardiology to see if we can come up with a good regimen that will work well for her to help control her symptoms.

## 2020-09-10 NOTE — Progress Notes (Signed)
Established Patient Office Visit  Subjective:  Patient ID: Tina Medina, female    DOB: January 06, 1941  Age: 79 y.o. MRN: [id number]  CC:  Chief Complaint  Patient presents with  . Hypertension    HPI [name] presents for several concerns today.  Hypertension- Pt denies chest pain, SOB, dizziness, or heart palpitations.  Taking meds as directed w/o problems.  Denies medication side effects.    F/U CKD 3 -   No recent changes.    She also earlier this week had an episode in the morning while trying to get dressed where she experienced some mid chest pain and felt like it was radiating down into both arms..  She sat down for about 5 minutes and says it eased off she said it scared her enough that she actually went took an aspirin and then laid down.  She had not eaten yet that day.  She does still complain of some shortness of breath especially when bending over to do things.  She still having burning episodes in the epigastric area even though she has been taking her Nexium regularly.  She just had an EGD on Apr 07, 2020 with digestive health that did show some gastritis.  Looks like she also had a couple biopsies taken as well as a dilatation performed.[name]  He is also noticed some anal leakage for about the last month.  She says normally she would have 1 bowel movement a day now she has been having 2-3 and noticing a little bit of leakage.  Also still has some tenderness over the tip of the tailbone.  She says she has to sit forward a little bit and cannot lean backwards quite sore.  She does remember any injury or trauma she thinks is just part of her chronic low back pain she does have a history of spinal stenosis of the lumbar spine.  Past Medical History:  Diagnosis Date  . DDD (degenerative disc disease), lumbar    L spine, spinal stenosis  . Gastritis    H Pylori  . Hyperlipidemia   . Hypertension   . Lumbar degenerative disc disease 2002   with stenosis  . 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 Menopause    since age 49  . Migraine   . Osteoma    2 mm of the L lid ethmoid air cells    Past Surgical History:  Procedure Laterality Date  . BREAST BIOPSY Left   . BREAST EXCISIONAL BIOPSY Left   . BREAST SURGERY  1969   Left    Family History  Problem Relation Age of Onset  . Hyperlipidemia Mother   . Hypertension Mother   . Diabetes Mother   . Prostate cancer Father   . Hypertension Brother     Social History   Socioeconomic History  . Marital status: Widowed    Spouse name: Not on file  . Number of children: 3  . Years of education: 34  . Highest education level: Some college, no degree  Occupational History  . Occupation: Retired.      Employer: [employer]    Comment: Retired  Tobacco Use  . Smoking status: Former Smoker    Types: Cigarettes  . Smokeless tobacco: Former Systems developer    Quit date: 12/05/1962  Vaping Use  . Vaping Use: Never used  Substance and Sexual Activity  . Alcohol use: Yes    Comment: Occasional glass of  wine  . Drug use: No  . Sexual activity: Not Currently  Other Topics Concern  . Not on file  Social History Narrative  . Not on file   Social Determinants of Health   Financial Resource Strain:   . Difficulty of Paying Living Expenses: Not on file  Food Insecurity:   . Worried About Charity fundraiser in the Last Year: Not on file  . Ran Out of Food in the Last Year: Not on file  Transportation Needs:   . Lack of Transportation (Medical): Not on file  . Lack of Transportation (Non-Medical): Not on file  Physical Activity:   . Days of Exercise per Week: Not on file  . Minutes of Exercise per Session: Not on file  Stress:   . Feeling of Stress : Not on file  Social Connections:   . Frequency of Communication with Friends and Family: Not on file  . Frequency of Social Gatherings with Friends and Family: Not on file  . Attends Religious Services: Not on file  . Active Member of  Clubs or Organizations: Not on file  . Attends Archivist Meetings: Not on file  . Marital Status: Not on file  Intimate Partner Violence:   . Fear of Current or Ex-Partner: Not on file  . Emotionally Abused: Not on file  . Physically Abused: Not on file  . Sexually Abused: Not on file    Outpatient Medications Prior to Visit  Medication Sig Dispense Refill  . aspirin 81 MG chewable tablet Chew 81 mg by mouth daily.      [name] BYSTOLIC 20 MG TABS TAKE 1 TABLET BY MOUTH TWICE A DAY 60 tablet 5  . hydrochlorothiazide (MICROZIDE) 12.5 MG capsule Take 1 capsule (12.5 mg total) by mouth daily. 90 capsule 1  . rosuvastatin (CRESTOR) 5 MG tablet Take 5 mg by mouth at bedtime.    [name] esomeprazole (NEXIUM) 20 MG capsule Take 20 mg by mouth daily at 12 noon.     No facility-administered medications prior to visit.    Allergies  Allergen Reactions  . Ace Inhibitors     REACTION: cough Other reaction(s): Abdominal Pain, Chest Pain Patient develops cough, headaches  . Atenolol   . Benicar [Olmesartan Medoxomil] Other (See Comments)    Chest pain  . Diltiazem Other (See Comments)    Sharp head ache.   . Hydralazine Other (See Comments)    "Chills"   . Losartan Other (See Comments)    headache  . Metoprolol Diarrhea    Abdominal cramping.   . Micardis [Telmisartan] Other (See Comments)    Chest Pain, Back Pain.   [name] Penicillins     REACTION: rash  . Verapamil Other (See Comments)    Headaches and constipation  . Amlodipine Rash    And Headaches    ROS Review of Systems    Objective:    Physical Exam Constitutional:      Appearance: She is well-developed.  HENT:     Head: Normocephalic and atraumatic.  Cardiovascular:     Rate and Rhythm: Normal rate and regular rhythm.     Heart sounds: Normal heart sounds.  Pulmonary:     Effort: Pulmonary effort is normal.     Breath sounds: Normal breath sounds.  Abdominal:     General: Abdomen is flat. Bowel sounds are  normal.     Palpations: Abdomen is soft.     Tenderness: There is no abdominal tenderness.  Skin:  General: Skin is warm and dry.  Neurological:     Mental Status: She is alert and oriented to person, place, and time.  Psychiatric:        Behavior: Behavior normal.     BP (!) 160/62   Pulse 61   Ht 5\' 4"  (1.626 m)   Wt 163 lb (73.9 kg)   SpO2 99%   BMI 27.98 kg/m  Wt Readings from Last 3 Encounters:  09/10/20 163 lb (73.9 kg)  06/19/20 161 lb (73 kg)  05/13/20 160 lb (72.6 kg)     There are no preventive care reminders to display for this patient.  There are no preventive care reminders to display for this patient.  Lab Results  Component Value Date   TSH 1.45 10/09/2019   Lab Results  Component Value Date   WBC 7.4 12/11/2019   HGB 13.8 12/11/2019   HCT 40.4 12/11/2019   MCV 94.6 12/11/2019   PLT 303 12/11/2019   Lab Results  Component Value Date   NA 141 05/26/2020   K 4.8 05/26/2020   CO2 30 05/26/2020   GLUCOSE 80 05/26/2020   BUN 15 05/26/2020   CREATININE 1.08 (H) 05/26/2020   BILITOT 0.6 12/11/2019   ALKPHOS 66 01/30/2015   AST 21 12/11/2019   ALT 11 12/11/2019   PROT 6.9 12/11/2019   ALBUMIN 4.1 01/30/2015   CALCIUM 9.8 05/26/2020   Lab Results  Component Value Date   CHOL 177 09/03/2018   Lab Results  Component Value Date   HDL 50 (L) 09/03/2018   Lab Results  Component Value Date   LDLCALC 110 (H) 09/03/2018   Lab Results  Component Value Date   TRIG 81 09/03/2018   Lab Results  Component Value Date   CHOLHDL 3.5 09/03/2018   Lab Results  Component Value Date   HGBA1C 5.4 09/03/2018      Assessment & Plan:   Problem List Items Addressed This Visit      Cardiovascular and Mediastinum   ESSENTIAL HYPERTENSION, BENIGN - Primary    [name] better today but still not well controlled.  She has multiple intolerances to blood pressure medications.      Relevant Orders   Lipid Panel w/reflex Direct LDL    COMPLETE METABOLIC PANEL WITH GFR   CBC   Angina pectoris ([place])    Had recurring chest pain for years she actually had a nuclear stress test when she was living in [place] that was neg.  I do believe we have a copy of that in the records.  She had an echocardiogram in 2019.  I do feel that her chest pain is probably just angina but I would like to send her to cardiology to see if we can come up with a good regimen that will work well for her to help control her symptoms.      Relevant Orders   Ambulatory referral to Cardiology     Digestive   GERD    For the epigastric pain lets increase her Nexium to 40 mg which is the prescription strength and have you take it twice a day for the next 4 weeks to see if this helps if it is helping then after 4 weeks you can try dropping down to once a day and see if it still able to maintain.  If it is not improving then I highly recommend that she follow back up with digestive health specialist since she just had her  scope done in May.      Relevant Medications   esomeprazole (NEXIUM) 40 MG capsule     Genitourinary   CKD (chronic kidney disease) stage 3, GFR 30-59 ml/min (HCC)   Relevant Orders   Lipid Panel w/reflex Direct LDL   COMPLETE METABOLIC PANEL WITH GFR   CBC   Urine Microalbumin w/creat. ratio     Other   Hyperlipidemia    Due tor recheck lipids.         Other Visit Diagnoses    Need for immunization against influenza       Relevant Orders   Flu Vaccine QUAD High Dose(Fluad) (Completed)   Anal discharge       Relevant Orders   DG Abd 1 View   Frequent stools       Relevant Orders   DG Abd 1 View   Epigastric burning sensation       Relevant Orders   Lipase   CBC   Atypical chest pain       Relevant Orders   Troponin I   EKG 12-Lead     Atypical chest pain-EKG today shows rate of 60 bpm, normal sinus rhythm with no acute ST-T wave changes.  No change from prior EKG performed about 6 months ago on March 05, 2020.  Frequent stools and anal leakage-we will get a KUB today just to make sure that she actually does not have excess stool that could be causing some leakage.  She has been taking a probiotic lately so I did encourage her to stop that for couple weeks just to make sure that that is not contributing to the problem.  If not improving we can always get her back in with digestive health.  Meds ordered this encounter  Medications  . esomeprazole (NEXIUM) 40 MG capsule    Sig: Take 1 capsule (40 mg total) by mouth 2 (two) times daily before a meal.    Dispense:  180 capsule    Refill:  1    Follow-up: Return in about 4 weeks (around 10/08/2020) for stomach problems and chest pain .   Spent 45 minutes in encounter.  Beatrice Lecher, MD

## 2020-09-11 ENCOUNTER — Other Ambulatory Visit: Payer: Self-pay | Admitting: *Deleted

## 2020-09-11 DIAGNOSIS — N1831 Chronic kidney disease, stage 3a: Secondary | ICD-10-CM | POA: Diagnosis not present

## 2020-09-11 DIAGNOSIS — R0789 Other chest pain: Secondary | ICD-10-CM | POA: Diagnosis not present

## 2020-09-11 DIAGNOSIS — I10 Essential (primary) hypertension: Secondary | ICD-10-CM | POA: Diagnosis not present

## 2020-09-11 DIAGNOSIS — R1013 Epigastric pain: Secondary | ICD-10-CM | POA: Diagnosis not present

## 2020-09-12 LAB — CBC
HCT: 42.4 % (ref 35.0–45.0)
Hemoglobin: 14.5 g/dL (ref 11.7–15.5)
MCH: 31.9 pg (ref 27.0–33.0)
MCHC: 34.2 g/dL (ref 32.0–36.0)
MCV: 93.4 fL (ref 80.0–100.0)
MPV: 11.9 fL (ref 7.5–12.5)
Platelets: 261 10*3/uL (ref 140–400)
RBC: 4.54 10*6/uL (ref 3.80–5.10)
RDW: 12.5 % (ref 11.0–15.0)
WBC: 6.8 10*3/uL (ref 3.8–10.8)

## 2020-09-12 LAB — COMPLETE METABOLIC PANEL WITH GFR
AG Ratio: 1.6 (calc) (ref 1.0–2.5)
ALT: 8 U/L (ref 6–29)
AST: 17 U/L (ref 10–35)
Albumin: 4.1 g/dL (ref 3.6–5.1)
Alkaline phosphatase (APISO): 80 U/L (ref 37–153)
BUN/Creatinine Ratio: 12 (calc) (ref 6–22)
BUN: 12 mg/dL (ref 7–25)
CO2: 30 mmol/L (ref 20–32)
Calcium: 9.6 mg/dL (ref 8.6–10.4)
Chloride: 108 mmol/L (ref 98–110)
Creat: 0.98 mg/dL — ABNORMAL HIGH (ref 0.60–0.88)
GFR, Est African American: 63 mL/min/{1.73_m2} (ref >=60)
GFR, Est Non African American: 54 mL/min/{1.73_m2} — ABNORMAL LOW (ref >=60)
Globulin: 2.6 g/dL (calc) (ref 1.9–3.7)
Glucose, Bld: 91 mg/dL (ref 65–99)
Potassium: 4.4 mmol/L (ref 3.5–5.3)
Sodium: 143 mmol/L (ref 135–146)
Total Bilirubin: 0.7 mg/dL (ref 0.2–1.2)
Total Protein: 6.7 g/dL (ref 6.1–8.1)

## 2020-09-12 LAB — LIPID PANEL W/REFLEX DIRECT LDL
Cholesterol: 200 mg/dL — ABNORMAL HIGH (ref <200)
HDL: 42 mg/dL — ABNORMAL LOW (ref >=50)
LDL Cholesterol (Calc): 135 mg/dL (calc) — ABNORMAL HIGH
Non-HDL Cholesterol (Calc): 158 mg/dL (calc) — ABNORMAL HIGH (ref <130)
Total CHOL/HDL Ratio: 4.8 (calc) (ref <5.0)
Triglycerides: 115 mg/dL (ref <150)

## 2020-09-12 LAB — MICROALBUMIN / CREATININE URINE RATIO
Creatinine, Urine: 81 mg/dL (ref 20–275)
Microalb Creat Ratio: 11 mcg/mg creat (ref <30)
Microalb, Ur: 0.9 mg/dL

## 2020-09-12 LAB — TROPONIN I: Troponin I: 7 ng/L (ref <=47)

## 2020-09-12 LAB — LIPASE: Lipase: 11 U/L (ref 7–60)

## 2020-09-16 ENCOUNTER — Other Ambulatory Visit: Payer: Self-pay | Admitting: Family Medicine

## 2020-09-16 MED ORDER — BYSTOLIC 20 MG PO TABS
1.0000 | ORAL_TABLET | Freq: Two times a day (BID) | ORAL | 1 refills | Status: DC
Start: 2020-09-16 — End: 2020-11-23

## 2020-09-17 ENCOUNTER — Telehealth: Payer: Self-pay

## 2020-09-17 MED ORDER — ESOMEPRAZOLE MAGNESIUM 20 MG PO CPDR
20.0000 mg | DELAYED_RELEASE_CAPSULE | Freq: Two times a day (BID) | ORAL | 1 refills | Status: DC
Start: 2020-09-17 — End: 2020-09-18

## 2020-09-17 NOTE — Telephone Encounter (Signed)
OK, that works. That is find.  Glad she is feeling better

## 2020-09-17 NOTE — Telephone Encounter (Signed)
Patient left msg stating that she has been taking the Nexium 20 BID and it seems to really be helping, so she was not going to take the 40 mg capsule as long as Dr Madilyn Fireman thought that was OK.  If any concerns, let me know. I have updated med list

## 2020-09-18 ENCOUNTER — Other Ambulatory Visit: Payer: Self-pay | Admitting: *Deleted

## 2020-09-18 MED ORDER — ESOMEPRAZOLE MAGNESIUM 20 MG PO CPDR
20.0000 mg | DELAYED_RELEASE_CAPSULE | Freq: Two times a day (BID) | ORAL | 1 refills | Status: DC
Start: 2020-09-18 — End: 2020-09-18

## 2020-09-18 MED ORDER — ESOMEPRAZOLE MAGNESIUM 20 MG PO CPDR
20.0000 mg | DELAYED_RELEASE_CAPSULE | Freq: Two times a day (BID) | ORAL | 1 refills | Status: DC
Start: 2020-09-18 — End: 2020-10-08

## 2020-09-18 NOTE — Telephone Encounter (Signed)
Left pt msg advising this is OK

## 2020-10-08 ENCOUNTER — Encounter: Payer: Self-pay | Admitting: Family Medicine

## 2020-10-08 ENCOUNTER — Other Ambulatory Visit: Payer: Self-pay

## 2020-10-08 ENCOUNTER — Ambulatory Visit (INDEPENDENT_AMBULATORY_CARE_PROVIDER_SITE_OTHER): Payer: Medicare HMO | Admitting: Family Medicine

## 2020-10-08 VITALS — BP 148/64 | HR 58 | Ht 64.0 in | Wt 163.0 lb

## 2020-10-08 DIAGNOSIS — I209 Angina pectoris, unspecified: Secondary | ICD-10-CM

## 2020-10-08 DIAGNOSIS — I10 Essential (primary) hypertension: Secondary | ICD-10-CM | POA: Diagnosis not present

## 2020-10-08 DIAGNOSIS — K2100 Gastro-esophageal reflux disease with esophagitis, without bleeding: Secondary | ICD-10-CM

## 2020-10-08 DIAGNOSIS — E785 Hyperlipidemia, unspecified: Secondary | ICD-10-CM | POA: Diagnosis not present

## 2020-10-08 DIAGNOSIS — R0789 Other chest pain: Secondary | ICD-10-CM

## 2020-10-08 MED ORDER — ROSUVASTATIN CALCIUM 5 MG PO TABS: 5.0000 mg | ORAL_TABLET | Freq: Every day | ORAL | 11 refills | Status: DC

## 2020-10-08 MED ORDER — PANTOPRAZOLE SODIUM 40 MG PO TBEC: 40.0000 mg | DELAYED_RELEASE_TABLET | Freq: Two times a day (BID) | ORAL | 1 refills | Status: DC

## 2020-10-08 MED ORDER — AMBULATORY NON FORMULARY MEDICATION: 0 refills | Status: DC

## 2020-10-08 MED ORDER — ROSUVASTATIN CALCIUM 5 MG PO TABS
5.0000 mg | ORAL_TABLET | Freq: Every day | ORAL | 3 refills | Status: DC
Start: 2020-10-08 — End: 2020-10-08

## 2020-10-08 NOTE — Assessment & Plan Note (Signed)
She has follow-up scheduled with cardiology in about a month.

## 2020-10-08 NOTE — Assessment & Plan Note (Signed)
She is feeling better overall as far as the epigastric pain but still getting occasional burning sensation I think it is worth trying a different PPI to see if she notices any improvement.  It looks like pantoprazole is covered on her insurance plan so prescription sent to pharmacy

## 2020-10-08 NOTE — Progress Notes (Signed)
Established Patient Office Visit  Subjective:  Patient ID: Tina Medina, female    DOB: June 11, 1941  Age: 79 y.o. MRN: [id number]  CC:  Chief Complaint  Patient presents with  . Abdominal Pain    HPI Tina Medina presents for atypical chest pain.  I really feel like she has symptoms consistent with angina please see preop prior note.  Cardiology office did get in touch with her and she is scheduled for an appointment in December.  In regards to the chest pain she says it has gotten a little bit better.  Overall she is scheduled for December 1 with cardiology blood pressures have still been little elevated at home the most recent when she checked was 157/70.  In regards to the epigastric pain she does feel like the Nexium 20 mg twice a day has actually been helping.  She says it definitely has made an improvement in epigastric pain but she still getting some intermittent burning she.  She most wonders if it is the Nexium itself as it seems to always occur not long after taking the medication.  Past Medical History:  Diagnosis Date  . DDD (degenerative disc disease), lumbar    L spine, spinal stenosis  . Gastritis    H Pylori  . Hyperlipidemia   . Hypertension   . Lumbar degenerative disc disease 2002   with stenosis  . Meningioma ([place]) 2010   8 mm in the R tentorium cerebelli  . Menopause    since age 1  . Migraine   . Osteoma    2 mm of the L lid ethmoid air cells    Past Surgical History:  Procedure Laterality Date  . BREAST BIOPSY Left   . BREAST EXCISIONAL BIOPSY Left   . BREAST SURGERY  1969   Left    Family History  Problem Relation Age of Onset  . Hyperlipidemia Mother   . Hypertension Mother   . Diabetes Mother   . Prostate cancer Father   . Hypertension Brother     Social History   Socioeconomic History  . Marital status: Widowed    Spouse name: Not on file  . Number of children: 3  . Years of education: 58  . Highest education level: Some  college, no degree  Occupational History  . Occupation: Retired.      Employer: [employer]    Comment: Retired  Tobacco Use  . Smoking status: Former Smoker    Types: Cigarettes  . Smokeless tobacco: Former Systems developer    Quit date: 12/05/1962  Vaping Use  . Vaping Use: Never used  Substance and Sexual Activity  . Alcohol use: Yes    Comment: Occasional glass of wine  . Drug use: No  . Sexual activity: Not Currently  Other Topics Concern  . Not on file  Social History Narrative  . Not on file   Social Determinants of Health   Financial Resource Strain:   . Difficulty of Paying Living Expenses: Not on file  Food Insecurity:   . Worried About Charity fundraiser in the Last Year: Not on file  . Ran Out of Food in the Last Year: Not on file  Transportation Needs:   . Lack of Transportation (Medical): Not on file  . Lack of Transportation (Non-Medical): Not on file  Physical Activity:   . Days of Exercise per Week: Not on file  . Minutes of Exercise per Session: Not on file  Stress:   .  Feeling of Stress : Not on file  Social Connections:   . Frequency of Communication with Friends and Family: Not on file  . Frequency of Social Gatherings with Friends and Family: Not on file  . Attends Religious Services: Not on file  . Active Member of Clubs or Organizations: Not on file  . Attends Archivist Meetings: Not on file  . Marital Status: Not on file  Intimate Partner Violence:   . Fear of Current or Ex-Partner: Not on file  . Emotionally Abused: Not on file  . Physically Abused: Not on file  . Sexually Abused: Not on file    Outpatient Medications Prior to Visit  Medication Sig Dispense Refill  . aspirin 81 MG chewable tablet Chew 81 mg by mouth daily.      . hydrochlorothiazide (MICROZIDE) 12.5 MG capsule Take 1 capsule (12.5 mg total) by mouth daily. 90 capsule 1  . Nebivolol HCl (BYSTOLIC) 20 MG TABS Take 1 tablet (20 mg total) by mouth 2 (two) times daily. 180 tablet 1   . esomeprazole (NEXIUM) 20 MG capsule Take 1 capsule (20 mg total) by mouth 2 (two) times daily before a meal. 60 capsule 1  . rosuvastatin (CRESTOR) 5 MG tablet Take 5 mg by mouth at bedtime.     No facility-administered medications prior to visit.    Allergies  Allergen Reactions  . Ace Inhibitors     REACTION: cough Other reaction(s): Abdominal Pain, Chest Pain Patient develops cough, headaches  . Atenolol   . Benicar [Olmesartan Medoxomil] Other (See Comments)    Chest pain  . Diltiazem Other (See Comments)    Sharp head ache.   . Hydralazine Other (See Comments)    "Chills"   . Losartan Other (See Comments)    headache  . Metoprolol Diarrhea    Abdominal cramping.   . Micardis [Telmisartan] Other (See Comments)    Chest Pain, Back Pain.   [name] Penicillins     REACTION: rash  . Verapamil Other (See Comments)    Headaches and constipation  . Amlodipine Rash    And Headaches    ROS Review of Systems    Objective:    Physical Exam Constitutional:      Appearance: She is well-developed.  HENT:     Head: Normocephalic and atraumatic.  Cardiovascular:     Rate and Rhythm: Normal rate and regular rhythm.     Heart sounds: Normal heart sounds.  Pulmonary:     Effort: Pulmonary effort is normal.     Breath sounds: Normal breath sounds.  Skin:    General: Skin is warm and dry.  Neurological:     Mental Status: She is alert and oriented to person, place, and time.  Psychiatric:        Behavior: Behavior normal.     BP (!) 148/64   Pulse (!) 58   Ht 5\' 4"  (1.626 m)   Wt 163 lb (73.9 kg)   SpO2 99%   BMI 27.98 kg/m  Wt Readings from Last 3 Encounters:  10/08/20 163 lb (73.9 kg)  09/10/20 163 lb (73.9 kg)  06/19/20 161 lb (73 kg)     There are no preventive care reminders to display for this patient.  There are no preventive care reminders to display for this patient.  Lab Results  Component Value Date   TSH 1.45 10/09/2019   Lab Results   Component Value Date   WBC 6.8 09/11/2020   HGB 14.5  09/11/2020   HCT 42.4 09/11/2020   MCV 93.4 09/11/2020   PLT 261 09/11/2020   Lab Results  Component Value Date   NA 143 09/11/2020   K 4.4 09/11/2020   CO2 30 09/11/2020   GLUCOSE 91 09/11/2020   BUN 12 09/11/2020   CREATININE 0.98 (H) 09/11/2020   BILITOT 0.7 09/11/2020   ALKPHOS 66 01/30/2015   AST 17 09/11/2020   ALT 8 09/11/2020   PROT 6.7 09/11/2020   ALBUMIN 4.1 01/30/2015   CALCIUM 9.6 09/11/2020   Lab Results  Component Value Date   CHOL 200 (H) 09/11/2020   Lab Results  Component Value Date   HDL 42 (L) 09/11/2020   Lab Results  Component Value Date   LDLCALC 135 (H) 09/11/2020   Lab Results  Component Value Date   TRIG 115 09/11/2020   Lab Results  Component Value Date   CHOLHDL 4.8 09/11/2020   Lab Results  Component Value Date   HGBA1C 5.4 09/03/2018      Assessment & Plan:   Problem List Items Addressed This Visit      Cardiovascular and Mediastinum   ESSENTIAL HYPERTENSION, BENIGN    He is doing well on the nebivolol and hydrochlorothiazide but does not really like the diuretic effect.  She wanted to know if it came in a smaller dose but unfortunately it does not.  She says she has plenty of the prescription left we will continue to monitor carefully.  Unfortunately, she has been intolerant to multiple drugs so were little bit limited with what we can use.      Relevant Medications   rosuvastatin (CRESTOR) 5 MG tablet (Start on 10/14/2020)   Angina pectoris [facility])    She has follow-up scheduled with cardiology in about a month.      Relevant Medications   rosuvastatin (CRESTOR) 5 MG tablet (Start on 10/14/2020)     Digestive   GERD    She is feeling better overall as far as the epigastric pain but still getting occasional burning sensation I think it is worth trying a different PPI to see if she notices any improvement.  It looks like pantoprazole is covered on her insurance plan  so prescription sent to pharmacy      Relevant Medications   pantoprazole (PROTONIX) 40 MG tablet     Other   Hyperlipidemia   Relevant Medications   rosuvastatin (CRESTOR) 5 MG tablet (Start on 10/14/2020)    Other Visit Diagnoses    Atypical chest pain    -  Primary      Meds ordered this encounter  Medications  . DISCONTD: rosuvastatin (CRESTOR) 5 MG tablet    Sig: Take 1 tablet (5 mg total) by mouth at bedtime.    Dispense:  90 tablet    Refill:  3  . pantoprazole (PROTONIX) 40 MG tablet    Sig: Take 1 tablet (40 mg total) by mouth 2 (two) times daily.    Dispense:  60 tablet    Refill:  1  . rosuvastatin (CRESTOR) 5 MG tablet    Sig: Take 1 tablet (5 mg total) by mouth at bedtime.    Dispense:  30 tablet    Refill:  11    These disregard prescription sent earlier today for 90-day supply she would prefer to do 30-day supply because of cost.  . AMBULATORY NON FORMULARY MEDICATION    Sig: Medication Name: Tdap IM x 1    Dispense:  1 Units  Refill:  0    Follow-up: Return in about 4 months (around 02/05/2021) for Hypertension.    Beatrice Lecher, MD

## 2020-10-08 NOTE — Assessment & Plan Note (Addendum)
He is doing well on the nebivolol and hydrochlorothiazide but does not really like the diuretic effect.  She wanted to know if it came in a smaller dose but unfortunately it does not.  She says she has plenty of the prescription left we will continue to monitor carefully.  Unfortunately, she has been intolerant to multiple drugs so were little bit limited with what we can use.

## 2020-10-12 ENCOUNTER — Encounter: Payer: Self-pay | Admitting: Family Medicine

## 2020-10-15 ENCOUNTER — Telehealth: Payer: Self-pay

## 2020-10-15 NOTE — Telephone Encounter (Signed)
Tina Medina states she is not going to take the pantoprazole because it has side effects that may effect her kidney's. She states she has been off the Nexium for 3-4 days without any problems.

## 2020-10-15 NOTE — Telephone Encounter (Signed)
Patient advised.

## 2020-10-15 NOTE — Telephone Encounter (Signed)
Okay, that sounds good 

## 2020-11-02 NOTE — Progress Notes (Deleted)
Referring-Catherine Metheney MD Reason for referral-chest pain  HPI: 79 year old female for evaluation of chest pain at request of Beatrice Lecher MD.  Patient seen previously but not since 2013. Patient has had medication intolerances including cough with ACE inhibitors, intolerance to Norvasc and ARBs. Echocardiogram in [place] January 2019 showed normal LV function, mild left ventricular hypertrophy, left atrial enlargement, mild aortic, mitral and tricuspid regurgitation.  Current Outpatient Medications  Medication Sig Dispense Refill  . AMBULATORY NON FORMULARY MEDICATION Medication Name: Tdap IM x 1 1 Units 0  . aspirin 81 MG chewable tablet Chew 81 mg by mouth daily.      . hydrochlorothiazide (MICROZIDE) 12.5 MG capsule Take 1 capsule (12.5 mg total) by mouth daily. 90 capsule 1  . Nebivolol HCl (BYSTOLIC) 20 MG TABS Take 1 tablet (20 mg total) by mouth 2 (two) times daily. 180 tablet 1  . pantoprazole (PROTONIX) 40 MG tablet Take 1 tablet (40 mg total) by mouth 2 (two) times daily. 60 tablet 1  . rosuvastatin (CRESTOR) 5 MG tablet Take 1 tablet (5 mg total) by mouth at bedtime. 30 tablet 11   No current facility-administered medications for this visit.    Allergies  Allergen Reactions  . Ace Inhibitors     REACTION: cough Other reaction(s): Abdominal Pain, Chest Pain Patient develops cough, headaches  . Atenolol   . Benicar [Olmesartan Medoxomil] Other (See Comments)    Chest pain  . Diltiazem Other (See Comments)    Sharp head ache.   . Hydralazine Other (See Comments)    "Chills"   . Losartan Other (See Comments)    headache  . Metoprolol Diarrhea    Abdominal cramping.   . Micardis [Telmisartan] Other (See Comments)    Chest Pain, Back Pain.   [name] Penicillins     REACTION: rash  . Verapamil Other (See Comments)    Headaches and constipation  . Amlodipine Rash    And Headaches    Past Medical History:  Diagnosis Date  . DDD (degenerative disc disease),  lumbar    L spine, spinal stenosis  . Gastritis    H Pylori  . Hyperlipidemia   . Hypertension   . Lumbar degenerative disc disease 2002   with stenosis  . 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 Menopause    since age 65  . Migraine   . Osteoma    2 mm of the L lid ethmoid air cells    Past Surgical History:  Procedure Laterality Date  . BREAST BIOPSY Left   . BREAST EXCISIONAL BIOPSY Left   . BREAST SURGERY  1969   Left    Social History   Socioeconomic History  . Marital status: Widowed    Spouse name: Not on file  . Number of children: 3  . Years of education: 69  . Highest education level: Some college, no degree  Occupational History  . Occupation: Retired.      Employer: [employer]    Comment: Retired  Tobacco Use  . Smoking status: Former Smoker    Types: Cigarettes  . Smokeless tobacco: Former Systems developer    Quit date: 12/05/1962  Vaping Use  . Vaping Use: Never used  Substance and Sexual Activity  . Alcohol use: Yes    Comment: Occasional glass of wine  . Drug use: No  . Sexual activity: Not Currently  Other Topics Concern  . Not on file  Social History Narrative  .  Not on file   Social Determinants of Health   Financial Resource Strain:   . Difficulty of Paying Living Expenses: Not on file  Food Insecurity:   . Worried About Charity fundraiser in the Last Year: Not on file  . Ran Out of Food in the Last Year: Not on file  Transportation Needs:   . Lack of Transportation (Medical): Not on file  . Lack of Transportation (Non-Medical): Not on file  Physical Activity:   . Days of Exercise per Week: Not on file  . Minutes of Exercise per Session: Not on file  Stress:   . Feeling of Stress : Not on file  Social Connections:   . Frequency of Communication with Friends and Family: Not on file  . Frequency of Social Gatherings with Friends and Family: Not on file  . Attends Religious Services: Not on file  . Active Member of Clubs or  Organizations: Not on file  . Attends Archivist Meetings: Not on file  . Marital Status: Not on file  Intimate Partner Violence:   . Fear of Current or Ex-Partner: Not on file  . Emotionally Abused: Not on file  . Physically Abused: Not on file  . Sexually Abused: Not on file    Family History  Problem Relation Age of Onset  . Hyperlipidemia Mother   . Hypertension Mother   . Diabetes Mother   . Prostate cancer Father   . Hypertension Brother     ROS: no fevers or chills, productive cough, hemoptysis, dysphasia, odynophagia, melena, hematochezia, dysuria, hematuria, rash, seizure activity, orthopnea, PND, pedal edema, claudication. Remaining systems are negative.  Physical Exam:   There were no vitals taken for this visit.  General:  Well developed/well nourished in NAD Skin warm/dry Patient not depressed No peripheral clubbing Back-normal HEENT-normal/normal eyelids Neck supple/normal carotid upstroke bilaterally; no bruits; no JVD; no thyromegaly chest - CTA/ normal expansion CV - RRR/normal S1 and S2; no murmurs, rubs or gallops;  PMI nondisplaced Abdomen -NT/ND, no HSM, no mass, + bowel sounds, no bruit 2+ femoral pulses, no bruits Ext-no edema, chords, 2+ DP Neuro-grossly nonfocal  ECG - 09/10/20; NSR, no ST changes; personally reviewed  A/P  1 chest pain-  2 hypertension-  3 hyperlipidemia-  Kirk Ruths, MD

## 2020-11-04 ENCOUNTER — Ambulatory Visit: Payer: Medicare HMO | Admitting: Cardiology

## 2020-11-04 ENCOUNTER — Telehealth: Payer: Self-pay | Admitting: Cardiology

## 2020-11-04 NOTE — Telephone Encounter (Signed)
Patient had appt today in [facility] with Dr. Stanford Breed as new patient, patient came at 9:00am, however, patient appt was at 2:00pm today.Patient stated "auto sevice told her to come at 9:00am.Called patient to make sure she could not come back or come 12/1    By Doristine Devoid

## 2020-11-18 ENCOUNTER — Telehealth: Payer: Self-pay | Admitting: Family Medicine

## 2020-11-18 DIAGNOSIS — I10 Essential (primary) hypertension: Secondary | ICD-10-CM

## 2020-11-18 NOTE — Telephone Encounter (Signed)
Call pt: received letter frm [employer].  The Bystolic is not covered. They will cover Carvedilol. Is she ok with trying this instead or does she want [place] to initial any appeal for the Bystolic.

## 2020-11-18 NOTE — Telephone Encounter (Signed)
She agreed to switch to carvedilol.

## 2020-11-23 MED ORDER — CARVEDILOL 25 MG PO TABS: 25.0000 mg | ORAL_TABLET | Freq: Two times a day (BID) | ORAL | 3 refills | Status: DC

## 2020-11-23 NOTE — Telephone Encounter (Signed)
Patient advised.

## 2020-11-23 NOTE — Telephone Encounter (Signed)
Okay.  New prescription sent for carvedilol 25 mg twice a day.  Bystolic removed from medication list.

## 2020-11-24 ENCOUNTER — Ambulatory Visit (INDEPENDENT_AMBULATORY_CARE_PROVIDER_SITE_OTHER): Payer: Medicare HMO | Admitting: Family Medicine

## 2020-11-24 ENCOUNTER — Other Ambulatory Visit: Payer: Self-pay

## 2020-11-24 ENCOUNTER — Encounter: Payer: Self-pay | Admitting: Family Medicine

## 2020-11-24 VITALS — BP 152/72 | HR 63 | Ht 64.0 in | Wt 163.0 lb

## 2020-11-24 DIAGNOSIS — R413 Other amnesia: Secondary | ICD-10-CM | POA: Insufficient documentation

## 2020-11-24 DIAGNOSIS — R519 Headache, unspecified: Secondary | ICD-10-CM | POA: Diagnosis not present

## 2020-11-24 DIAGNOSIS — G4700 Insomnia, unspecified: Secondary | ICD-10-CM

## 2020-11-24 DIAGNOSIS — H6122 Impacted cerumen, left ear: Secondary | ICD-10-CM | POA: Diagnosis not present

## 2020-11-24 MED ORDER — SPIRONOLACTONE 25 MG PO TABS: 25.0000 mg | ORAL_TABLET | Freq: Every day | ORAL | 0 refills | Status: DC

## 2020-11-24 NOTE — Patient Instructions (Signed)
Please take your pill to the pharmacy to verify that it really is the generic Bystolic and that you are taking the correct medication on just 1 to make sure that this is not causing some of your new symptoms.  I would also like for you to take a new pill called spironolactone which we have not tried before to help lower your blood pressure.  It is a type of diuretic.  I want to see if this will also help with some of the swelling that you are getting in your face.

## 2020-11-24 NOTE — Assessment & Plan Note (Signed)
Clear etiology it seems to be intermittent and sharp and only last a few minutes at a time but it always seems to be left-sided no known triggers.  That the blood pressure's are still uncontrolled.  She also has an impacted left ear canal cerumen and irrigate that and see if that makes a difference.  I also want to add spironolactone and see if we can try to get her blood pressure down she is been intolerant to multiple blood pressure medications.

## 2020-11-24 NOTE — Assessment & Plan Note (Signed)
Since she has had some memory changes over an indefinite period of time I do think this warrants further work-up.  We will schedule for brain MRI for further work-up.  With her longstanding hypertension she is at high risk for peripheral vascular disease and stroke.

## 2020-11-24 NOTE — Assessment & Plan Note (Signed)
Still struggling with sleep quality.  Will sleep for 4 hours and then awake for 2-3 hours and then finally falls back asleep.

## 2020-11-24 NOTE — Progress Notes (Signed)
Established Patient Office Visit  Subjective:  Patient ID: Tina Medina, female    DOB: 20-Jun-1941  Age: 79 y.o. MRN: [id number]  CC:  Chief Complaint  Patient presents with  . Follow-up    HPI Tina Medina presents for left sided Headaches that have been occurring on most biweekly since she got her Covid booster in October she says it will come on suddenly and last usually just for a few minutes but it is very sharp and intense she rates it about an 8 out of 10 over 10.  She said she had one that was quite severe that lasted a most an hour she finally ended up taking ibuprofen which did help some it finally eased off she is also noticed some vision changes over the last couple months not related to the headaches but she has had to go back and forth between pairs of glasses.  Says she has not checked her blood pressure when she gets the pain.  Is also been waking up in the little dizzy and nauseated in the mornings.  Right after her Covid booster she had some body aches that lasted about a day but no other symptoms such as fever or other side effects at that time.  He did note that when she picked up [name] it did not look like a triangle it look like a circle.  She also reports that she feels like her memory is off.  She feels like it is been gradually getting worse she is becoming more forgetful she will go into her room to do something forget why she went.  She has had times where she was driving and forgot where she was going.  She says she is not really losing things.  She is not having any difficulty paying bills.  She does have an eye exam scheduled for January 24 with Dr. Jaymes Graff.  Last brain MRI was in 2013 per our records. She can't remember if she had any head imaging in [place].  Past Medical History:  Diagnosis Date  . DDD (degenerative disc disease), lumbar    L spine, spinal stenosis  . Gastritis    H Pylori  . Hyperlipidemia   . Hypertension   . Lumbar degenerative  disc disease 2002   with stenosis  . 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 Menopause    since age 28  . Migraine   . Osteoma    2 mm of the L lid ethmoid air cells    Past Surgical History:  Procedure Laterality Date  . BREAST BIOPSY Left   . BREAST EXCISIONAL BIOPSY Left   . BREAST SURGERY  1969   Left    Family History  Problem Relation Age of Onset  . Hyperlipidemia Mother   . Hypertension Mother   . Diabetes Mother   . Prostate cancer Father   . Hypertension Brother     Social History   Socioeconomic History  . Marital status: Widowed    Spouse name: Not on file  . Number of children: 3  . Years of education: 50  . Highest education level: Some college, no degree  Occupational History  . Occupation: Retired.      Employer: [employer]    Comment: Retired  Tobacco Use  . Smoking status: Former Smoker    Types: Cigarettes  . Smokeless tobacco: Former Systems developer    Quit date: 12/05/1962  Vaping Use  .  Vaping Use: Never used  Substance and Sexual Activity  . Alcohol use: Yes    Comment: Occasional glass of wine  . Drug use: No  . Sexual activity: Not Currently  Other Topics Concern  . Not on file  Social History Narrative  . Not on file   Social Determinants of Health   Financial Resource Strain: Not on file  Food Insecurity: Not on file  Transportation Needs: Not on file  Physical Activity: Not on file  Stress: Not on file  Social Connections: Not on file  Intimate Partner Violence: Not on file    Outpatient Medications Prior to Visit  Medication Sig Dispense Refill  . AMBULATORY NON FORMULARY MEDICATION Medication Name: Tdap IM x 1 1 Units 0  . aspirin 81 MG chewable tablet Chew 81 mg by mouth daily.    . hydrochlorothiazide (MICROZIDE) 12.5 MG capsule Take 1 capsule (12.5 mg total) by mouth daily. 90 capsule 1  . rosuvastatin (CRESTOR) 5 MG tablet Take 1 tablet (5 mg total) by mouth at bedtime. 30 tablet 11  . carvedilol (COREG)  25 MG tablet Take 1 tablet (25 mg total) by mouth 2 (two) times daily with a meal. (Patient not taking: Reported on 11/24/2020) 60 tablet 3  . pantoprazole (PROTONIX) 40 MG tablet Take 1 tablet (40 mg total) by mouth 2 (two) times daily. (Patient not taking: Reported on 11/24/2020) 60 tablet 1   No facility-administered medications prior to visit.    Allergies  Allergen Reactions  . Ace Inhibitors     REACTION: cough Other reaction(s): Abdominal Pain, Chest Pain Patient develops cough, headaches  . Atenolol   . Benicar [Olmesartan Medoxomil] Other (See Comments)    Chest pain  . Diltiazem Other (See Comments)    Sharp head ache.   . Hydralazine Other (See Comments)    "Chills"   . Losartan Other (See Comments)    headache  . Metoprolol Diarrhea    Abdominal cramping.   . Micardis [Telmisartan] Other (See Comments)    Chest Pain, Back Pain.   [name] Penicillins     REACTION: rash  . Verapamil Other (See Comments)    Headaches and constipation  . Amlodipine Rash    And Headaches    ROS Review of Systems    Objective:    Physical Exam Constitutional:      Appearance: She is well-developed.  HENT:     Head: Normocephalic and atraumatic.     Comments: Left cerumen impaction.      Right Ear: Tympanic membrane, ear canal and external ear normal.     Left Ear: Tympanic membrane, ear canal and external ear normal.     Nose: Nose normal.     Mouth/Throat:     Mouth: Mucous membranes are moist.     Pharynx: Oropharynx is clear. No oropharyngeal exudate or posterior oropharyngeal erythema.  Eyes:     Conjunctiva/sclera: Conjunctivae normal.     Pupils: Pupils are equal, round, and reactive to light.  Neck:     Thyroid: No thyromegaly.  Cardiovascular:     Rate and Rhythm: Normal rate and regular rhythm.     Heart sounds: Normal heart sounds.  Pulmonary:     Effort: Pulmonary effort is normal.     Breath sounds: Normal breath sounds. No wheezing.  Musculoskeletal:      Cervical back: Neck supple.  Lymphadenopathy:     Cervical: No cervical adenopathy.  Skin:    General: Skin is warm and  dry.  Neurological:     Mental Status: She is alert and oriented to person, place, and time.     BP (!) 152/72   Pulse 63   Ht 5\' 4"  (1.626 m)   Wt 163 lb (73.9 kg)   SpO2 99%   BMI 27.98 kg/m  Wt Readings from Last 3 Encounters:  11/24/20 163 lb (73.9 kg)  10/08/20 163 lb (73.9 kg)  09/10/20 163 lb (73.9 kg)     There are no preventive care reminders to display for this patient.  There are no preventive care reminders to display for this patient.  Lab Results  Component Value Date   TSH 1.45 10/09/2019   Lab Results  Component Value Date   WBC 6.8 09/11/2020   HGB 14.5 09/11/2020   HCT 42.4 09/11/2020   MCV 93.4 09/11/2020   PLT 261 09/11/2020   Lab Results  Component Value Date   NA 143 09/11/2020   K 4.4 09/11/2020   CO2 30 09/11/2020   GLUCOSE 91 09/11/2020   BUN 12 09/11/2020   CREATININE 0.98 (H) 09/11/2020   BILITOT 0.7 09/11/2020   ALKPHOS 66 01/30/2015   AST 17 09/11/2020   ALT 8 09/11/2020   PROT 6.7 09/11/2020   ALBUMIN 4.1 01/30/2015   CALCIUM 9.6 09/11/2020   Lab Results  Component Value Date   CHOL 200 (H) 09/11/2020   Lab Results  Component Value Date   HDL 42 (L) 09/11/2020   Lab Results  Component Value Date   LDLCALC 135 (H) 09/11/2020   Lab Results  Component Value Date   TRIG 115 09/11/2020   Lab Results  Component Value Date   CHOLHDL 4.8 09/11/2020   Lab Results  Component Value Date   HGBA1C 5.4 09/03/2018      Assessment & Plan:   Problem List Items Addressed This Visit      Other   Memory impairment - Primary    Since she has had some memory changes over an indefinite period of time I do think this warrants further work-up.  We will schedule for brain MRI for further work-up.  With her longstanding hypertension she is at high risk for peripheral vascular disease and stroke.       Relevant Orders   MR Brain W Wo Contrast   COMPLETE METABOLIC PANEL WITH GFR   CBC   Left-sided headache    Clear etiology it seems to be intermittent and sharp and only last a few minutes at a time but it always seems to be left-sided no known triggers.  That the blood pressure's are still uncontrolled.  She also has an impacted left ear canal cerumen and irrigate that and see if that makes a difference.  I also want to add spironolactone and see if we can try to get her blood pressure down she is been intolerant to multiple blood pressure medications.      Relevant Orders   COMPLETE METABOLIC PANEL WITH GFR   CBC   Insomnia    Still struggling with sleep quality.  Will sleep for 4 hours and then awake for 2-3 hours and then finally falls back asleep.        Other Visit Diagnoses    Impacted cerumen of left ear          Meds ordered this encounter  Medications  . spironolactone (ALDACTONE) 25 MG tablet    Sig: Take 1 tablet (25 mg total) by mouth daily.  Dispense:  30 tablet    Refill:  0    Follow-up: Return in about 2 weeks (around 12/08/2020) for Hypertension and headaches.   I spent 35 minutes on the day of the encounter to include pre-visit record review, face-to-face time with the patient and post visit ordering of test.   Beatrice Lecher, MD

## 2020-11-25 ENCOUNTER — Ambulatory Visit (INDEPENDENT_AMBULATORY_CARE_PROVIDER_SITE_OTHER): Payer: Medicare HMO | Admitting: Physician Assistant

## 2020-11-25 ENCOUNTER — Encounter: Payer: Self-pay | Admitting: Physician Assistant

## 2020-11-25 DIAGNOSIS — H6122 Impacted cerumen, left ear: Secondary | ICD-10-CM | POA: Diagnosis not present

## 2020-11-25 LAB — CBC
HCT: 42.5 % (ref 35.0–45.0)
Hemoglobin: 14.7 g/dL (ref 11.7–15.5)
MCH: 31.7 pg (ref 27.0–33.0)
MCHC: 34.6 g/dL (ref 32.0–36.0)
MCV: 91.8 fL (ref 80.0–100.0)
MPV: 11.8 fL (ref 7.5–12.5)
Platelets: 327 10*3/uL (ref 140–400)
RBC: 4.63 10*6/uL (ref 3.80–5.10)
RDW: 12.2 % (ref 11.0–15.0)
WBC: 7.6 10*3/uL (ref 3.8–10.8)

## 2020-11-25 LAB — COMPLETE METABOLIC PANEL WITH GFR
AG Ratio: 1.5 (calc) (ref 1.0–2.5)
ALT: 10 U/L (ref 6–29)
AST: 20 U/L (ref 10–35)
Albumin: 4.4 g/dL (ref 3.6–5.1)
Alkaline phosphatase (APISO): 82 U/L (ref 37–153)
BUN/Creatinine Ratio: 13 (calc) (ref 6–22)
BUN: 14 mg/dL (ref 7–25)
CO2: 33 mmol/L — ABNORMAL HIGH (ref 20–32)
Calcium: 10.2 mg/dL (ref 8.6–10.4)
Chloride: 105 mmol/L (ref 98–110)
Creat: 1.06 mg/dL — ABNORMAL HIGH (ref 0.60–0.88)
GFR, Est African American: 57 mL/min/{1.73_m2} — ABNORMAL LOW (ref >=60)
GFR, Est Non African American: 49 mL/min/{1.73_m2} — ABNORMAL LOW (ref >=60)
Globulin: 3 g/dL (calc) (ref 1.9–3.7)
Glucose, Bld: 102 mg/dL — ABNORMAL HIGH (ref 65–99)
Potassium: 4.7 mmol/L (ref 3.5–5.3)
Sodium: 142 mmol/L (ref 135–146)
Total Bilirubin: 0.4 mg/dL (ref 0.2–1.2)
Total Protein: 7.4 g/dL (ref 6.1–8.1)

## 2020-11-25 NOTE — Progress Notes (Signed)
Pt came in to have an ear lavage done for cerumen impaction.  She tolerated this well. No dizziness afterwards. She was advised to look for any pain associated with this.

## 2020-11-25 NOTE — Progress Notes (Signed)
Patient ID: Tina Medina, female   DOB: 08/12/1941, 79 y.o.   MRN: [id number]  Agree with plan.

## 2020-11-26 ENCOUNTER — Other Ambulatory Visit: Payer: Self-pay | Admitting: Family Medicine

## 2020-12-01 ENCOUNTER — Telehealth: Payer: Self-pay | Admitting: Family Medicine

## 2020-12-01 NOTE — Telephone Encounter (Signed)
Can we check on the MRI it was ordered a week ago but does not look like it has been scheduled I do not know for still working on authorization?  I thought we had gotten caught up on all authorizations last Thursday?

## 2020-12-01 NOTE — Telephone Encounter (Signed)
Tina Medina called and states Carvedilol is causing her to have diarrhea and chest discomfort. She left the message stating she is going to stop taking the medication.

## 2020-12-01 NOTE — Telephone Encounter (Signed)
It was authorized today. I was the only clinical person in triage Wednesday and Thursday. They are going to get her scheduled.

## 2020-12-02 NOTE — Telephone Encounter (Signed)
Spoke with patient and discussed recommendations for 1/2 tab of carvedilol twice a day. She reports that she is not willing to try this. She tells me that she took one dose of the carvedilol and her "heart hurt" and she experienced cold chills and diarrhea.  She tells me that generic brand medications give her adverse reactions. Discussed sending in brand name medication of carvedilol. She reports that she is not interested in that at this time due to not wanting to spend more money on medication.   Discussed referral to hypertensive specialist suggested by Dr. Linford Arnold. She reports that she is not interested in this option right now and does not want to see any other doctors at this time.  She reports that she does have some of the Bystolic left from her previous prescription and she has been taking these. She did admit that her blood pressure has not been well controlled on Bystolic, but she has not been checking her blood pressure at home.  I discussed risks associated with uncontrolled blood pressure. Recommend that she monitors her blood pressure at home daily and brings log with her to appointment on 12/09/2020. Encouraged patient to report blood pressure readings over 170/90 to the office and not wait for the appointment, if these occur.  She expressed understanding. She does have new appointment with Dr. Jens Som in late January.

## 2020-12-02 NOTE — Telephone Encounter (Signed)
Okay, would she be willing to try a half a tab twice a day?  If not I would love to send her to one of our new hypertension specialists.  We have a new cardiologist who is fantastic I think she would really like her.  And she has a specialty clinic for her to treat high blood pressure.  Her name is Dr. Chilton Si.

## 2020-12-08 IMAGING — DX DG CHEST 2V
2 series · 2 of 2 positions shown · non-contrast
Comparison: 10/23/2018

CLINICAL DATA: 78-year-old female with shortness of breath and
cough

EXAM:
CHEST - 2 VIEW

[chest pa]
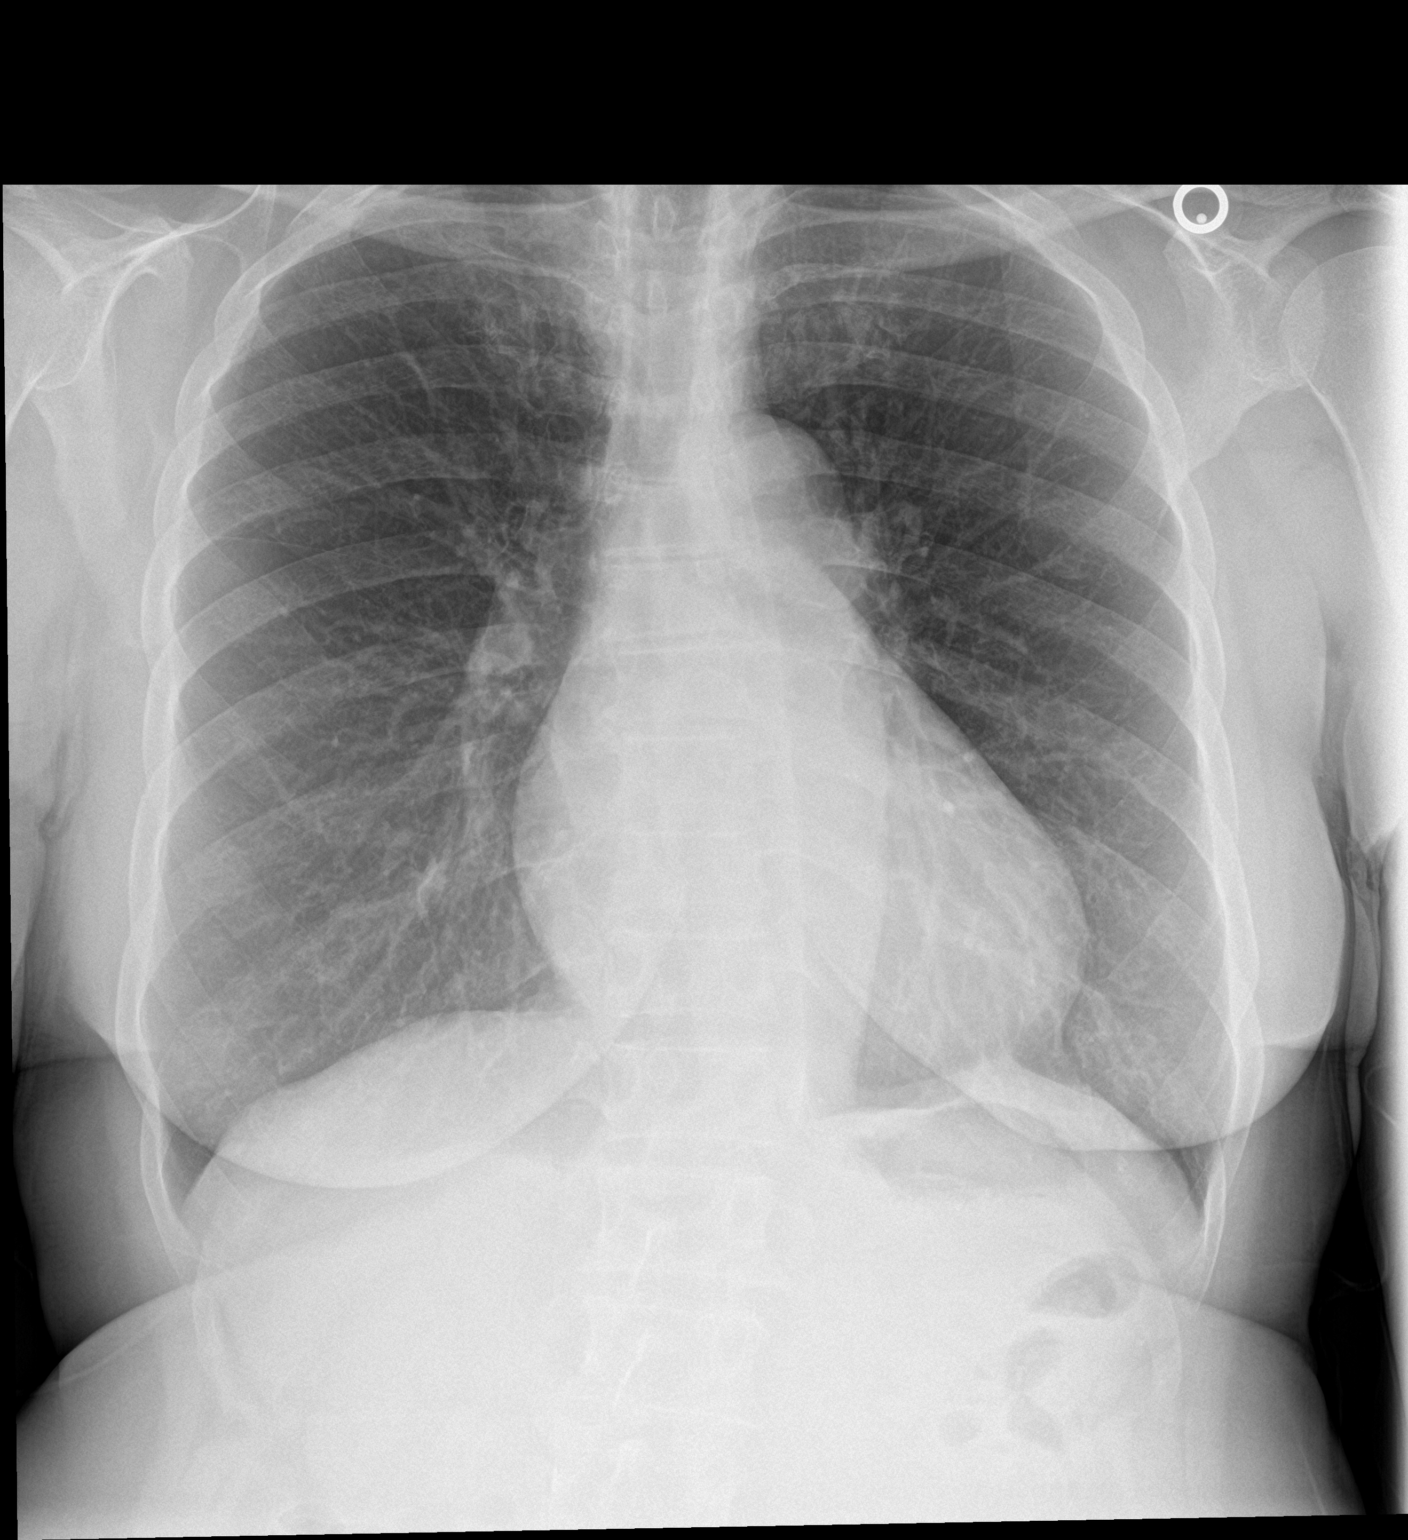

[chest lat]
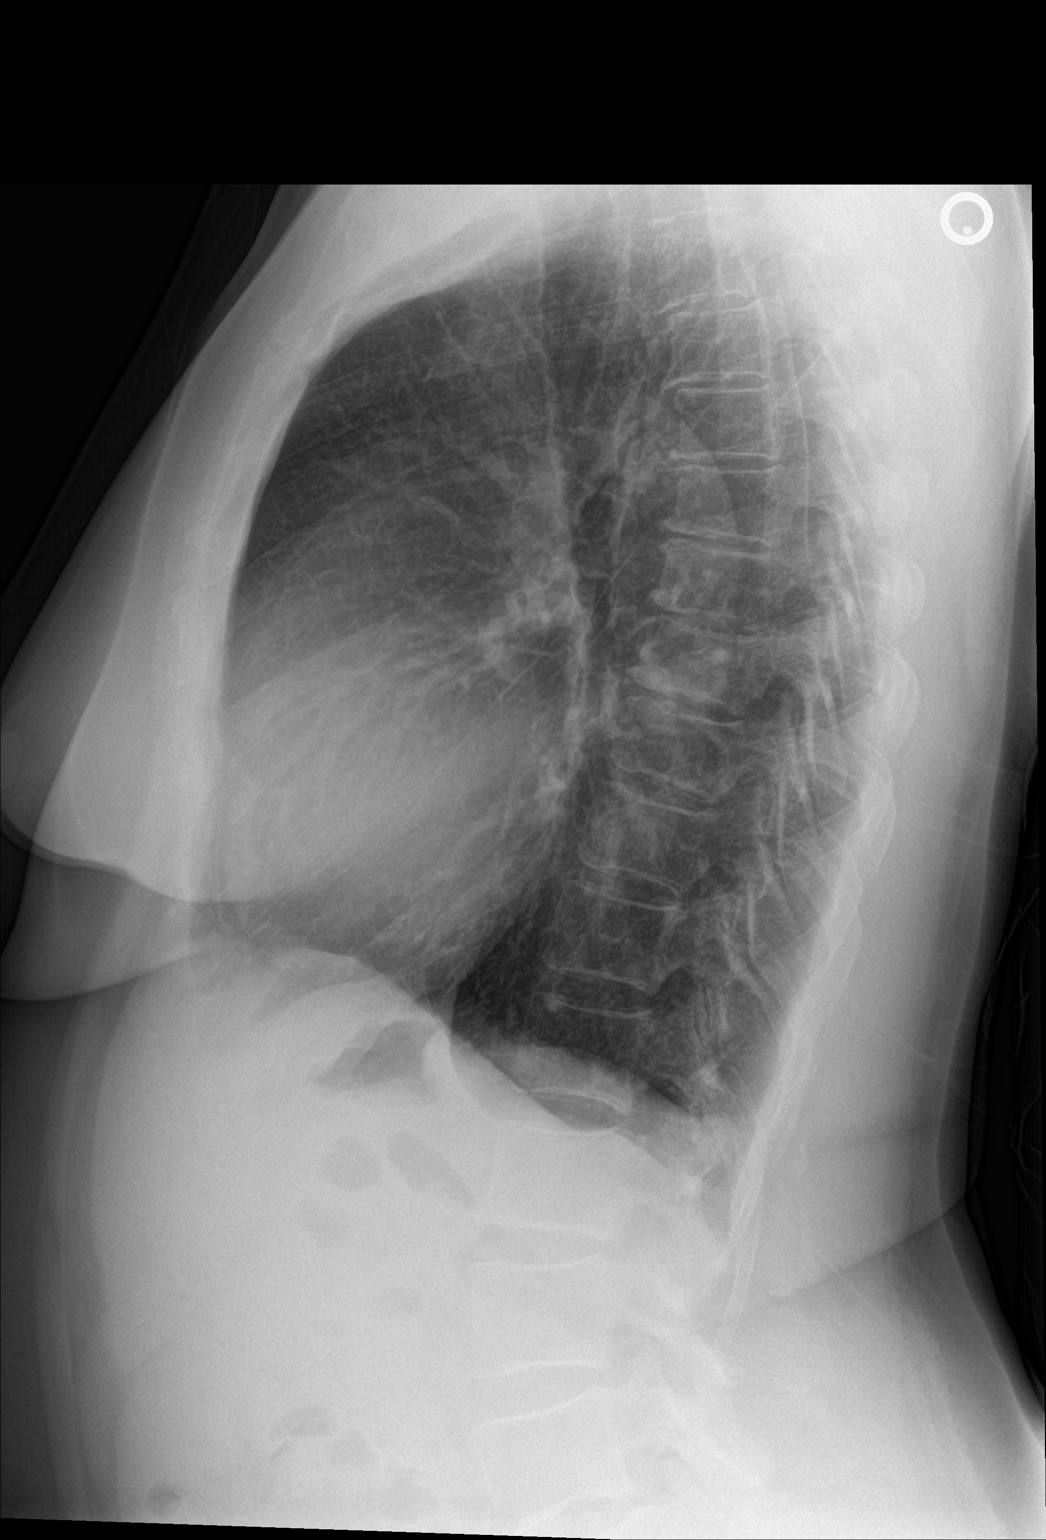

[2 of 2 positions shown; findings below may reference images not displayed]

FINDINGS: Cardiomediastinal silhouette unchanged in size and contour. No
evidence of central vascular congestion. No pneumothorax or pleural
effusion. No confluent airspace disease. Scoliotic curvature without
displaced fracture
IMPRESSION: Negative for acute cardiopulmonary disease

## 2020-12-09 ENCOUNTER — Telehealth: Payer: Self-pay

## 2020-12-09 ENCOUNTER — Ambulatory Visit (INDEPENDENT_AMBULATORY_CARE_PROVIDER_SITE_OTHER): Payer: Medicare HMO | Admitting: Family Medicine

## 2020-12-09 ENCOUNTER — Encounter: Payer: Self-pay | Admitting: Family Medicine

## 2020-12-09 ENCOUNTER — Other Ambulatory Visit: Payer: Self-pay

## 2020-12-09 VITALS — BP 158/78 | HR 66 | Ht 64.0 in | Wt 162.0 lb

## 2020-12-09 DIAGNOSIS — R519 Headache, unspecified: Secondary | ICD-10-CM

## 2020-12-09 DIAGNOSIS — I10 Essential (primary) hypertension: Secondary | ICD-10-CM

## 2020-12-09 NOTE — Telephone Encounter (Signed)
Tina Medina states her insurance will cover the nebivolol. She would like to stay on this medication.

## 2020-12-09 NOTE — Progress Notes (Signed)
Established Patient Office Visit  Subjective:  Patient ID: Tina Medina, female    DOB: March 01, 1941  Age: 80 y.o. MRN: [id number]  CC:  Chief Complaint  Patient presents with  . Hypertension  . Headache    HPI Tina Medina presents for   She stopped the carvedilol bc it made her chest hurt and made her :"feel cold"  She never tried the spironolactone. She saw that it is used on animals and didn't want to take it. Says she doesn't take her HCTZ daily. Often skips it if leaving the house.      She also complained of significant left-sided headaches that were occurring about twice a week that were extremely intense.  She says since I last saw her she is only had one episode.  She did get scheduled for her MRI on Monday.  Past Medical History:  Diagnosis Date  . DDD (degenerative disc disease), lumbar    L spine, spinal stenosis  . Gastritis    H Pylori  . Hyperlipidemia   . Hypertension   . Lumbar degenerative disc disease 2002   with stenosis  . 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 Menopause    since age 4  . Migraine   . Osteoma    2 mm of the L lid ethmoid air cells    Past Surgical History:  Procedure Laterality Date  . BREAST BIOPSY Left   . BREAST EXCISIONAL BIOPSY Left   . BREAST SURGERY  1969   Left    Family History  Problem Relation Age of Onset  . Hyperlipidemia Mother   . Hypertension Mother   . Diabetes Mother   . Prostate cancer Father   . Hypertension Brother     Social History   Socioeconomic History  . Marital status: Widowed    Spouse name: Not on file  . Number of children: 3  . Years of education: 54  . Highest education level: Some college, no degree  Occupational History  . Occupation: Retired.      Employer: [employer]    Comment: Retired  Tobacco Use  . Smoking status: Former Smoker    Types: Cigarettes  . Smokeless tobacco: Former Neurosurgeon    Quit date: 12/05/1962  Vaping Use  . Vaping Use: Never used   Substance and Sexual Activity  . Alcohol use: Yes    Comment: Occasional glass of wine  . Drug use: No  . Sexual activity: Not Currently  Other Topics Concern  . Not on file  Social History Narrative  . Not on file   Social Determinants of Health   Financial Resource Strain: Not on file  Food Insecurity: Not on file  Transportation Needs: Not on file  Physical Activity: Not on file  Stress: Not on file  Social Connections: Not on file  Intimate Partner Violence: Not on file    Outpatient Medications Prior to Visit  Medication Sig Dispense Refill  . aspirin 81 MG chewable tablet Chew 81 mg by mouth daily.    . hydrochlorothiazide (MICROZIDE) 12.5 MG capsule Take 1 capsule (12.5 mg total) by mouth daily. 90 capsule 1  . rosuvastatin (CRESTOR) 5 MG tablet Take 1 tablet (5 mg total) by mouth at bedtime. 30 tablet 11  . spironolactone (ALDACTONE) 25 MG tablet Take 1 tablet (25 mg total) by mouth daily. (Patient not taking: Reported on 12/09/2020) 30 tablet 0  . AMBULATORY NON FORMULARY MEDICATION Medication  Name: Tdap IM x 1 1 Units 0  . carvedilol (COREG) 25 MG tablet Take 1 tablet (25 mg total) by mouth 2 (two) times daily with a meal. (Patient not taking: Reported on 11/25/2020) 60 tablet 3   No facility-administered medications prior to visit.    Allergies  Allergen Reactions  . Ace Inhibitors     REACTION: cough Other reaction(s): Abdominal Pain, Chest Pain Patient develops cough, headaches  . Atenolol   . Benicar [Olmesartan Medoxomil] Other (See Comments)    Chest pain  . Carvedilol Diarrhea    Complains of diarrhea and chest pain.   . Diltiazem Other (See Comments)    Sharp head ache.   . Hydralazine Other (See Comments)    "Chills"   . Losartan Other (See Comments)    headache  . Metoprolol Diarrhea    Abdominal cramping.   . Micardis [Telmisartan] Other (See Comments)    Chest Pain, Back Pain.   [name] Penicillins     REACTION: rash  . Verapamil Other (See  Comments)    Headaches and constipation  . Amlodipine Rash    And Headaches    ROS Review of Systems    Objective:    Physical Exam Vitals reviewed.  Constitutional:      Appearance: She is well-developed and well-nourished.  HENT:     Head: Normocephalic and atraumatic.  Eyes:     Extraocular Movements: EOM normal.     Conjunctiva/sclera: Conjunctivae normal.  Cardiovascular:     Rate and Rhythm: Normal rate.  Pulmonary:     Effort: Pulmonary effort is normal.  Skin:    General: Skin is dry.     Coloration: Skin is not pale.  Neurological:     Mental Status: She is alert and oriented to person, place, and time.  Psychiatric:        Mood and Affect: Mood and affect normal.        Behavior: Behavior normal.     BP (!) 158/78   Pulse 66   Ht 5\' 4"  (1.626 m)   Wt 162 lb (73.5 kg)   SpO2 99%   BMI 27.81 kg/m  Wt Readings from Last 3 Encounters:  12/09/20 162 lb (73.5 kg)  11/24/20 163 lb (73.9 kg)  10/08/20 163 lb (73.9 kg)     There are no preventive care reminders to display for this patient.  There are no preventive care reminders to display for this patient.  Lab Results  Component Value Date   TSH 1.45 10/09/2019   Lab Results  Component Value Date   WBC 7.6 11/24/2020   HGB 14.7 11/24/2020   HCT 42.5 11/24/2020   MCV 91.8 11/24/2020   PLT 327 11/24/2020   Lab Results  Component Value Date   NA 142 11/24/2020   K 4.7 11/24/2020   CO2 33 (H) 11/24/2020   GLUCOSE 102 (H) 11/24/2020   BUN 14 11/24/2020   CREATININE 1.06 (H) 11/24/2020   BILITOT 0.4 11/24/2020   ALKPHOS 66 01/30/2015   AST 20 11/24/2020   ALT 10 11/24/2020   PROT 7.4 11/24/2020   ALBUMIN 4.1 01/30/2015   CALCIUM 10.2 11/24/2020   Lab Results  Component Value Date   CHOL 200 (H) 09/11/2020   Lab Results  Component Value Date   HDL 42 (L) 09/11/2020   Lab Results  Component Value Date   LDLCALC 135 (H) 09/11/2020   Lab Results  Component Value Date   TRIG 115  09/11/2020  Lab Results  Component Value Date   CHOLHDL 4.8 09/11/2020   Lab Results  Component Value Date   HGBA1C 5.4 09/03/2018      Assessment & Plan:   Problem List Items Addressed This Visit      Cardiovascular and Mediastinum   ESSENTIAL HYPERTENSION, BENIGN - Primary    BP still uncontrolled.  She reports that she really does not take her HCTZ regularly so we discussed try to take it more consistently.  And even possibly increasing the dose.  We will hold off on the spironolactone for now but I want her to still consider taking it there are some good studies showing that the 2 medications together can work well to lower blood pressure.  She also received a phone call from her insurance that the [place] will not be covered this year under her insurance plan so we looked under good Rx she can get a 90-day supply at [facility] for about $45 a month so that may be our best choice.  She is currently paying around $40 a month with her health insurance.        Other   Left-sided headache     Left-sided headache-MRI scheduled for Monday.  I still think getting her blood pressure under good control could be helpful.   No orders of the defined types were placed in this encounter.   Follow-up: No follow-ups on file.    Beatrice Lecher, MD

## 2020-12-09 NOTE — Assessment & Plan Note (Signed)
BP still uncontrolled.  She reports that she really does not take her HCTZ regularly so we discussed try to take it more consistently.  And even possibly increasing the dose.  We will hold off on the spironolactone for now but I want her to still consider taking it there are some good studies showing that the 2 medications together can work well to lower blood pressure.  She also received a phone call from her insurance that the [place] will not be covered this year under her insurance plan so we looked under good Rx she can get a 90-day supply at [facility] for about $45 a month so that may be our best choice.  She is currently paying around $40 a month with her health insurance.

## 2020-12-11 ENCOUNTER — Telehealth: Payer: Self-pay

## 2020-12-11 MED ORDER — LORAZEPAM 0.5 MG PO TABS: 0.5000 mg | ORAL_TABLET | Freq: Once | ORAL | 0 refills | Status: DC | PRN

## 2020-12-11 NOTE — Telephone Encounter (Signed)
Med sent.

## 2020-12-11 NOTE — Telephone Encounter (Signed)
Patient advised.

## 2020-12-11 NOTE — Telephone Encounter (Signed)
Fernande called and left a message requesting pre-medication for anxiety before/during MRI. Please advise.

## 2020-12-14 ENCOUNTER — Other Ambulatory Visit: Payer: Self-pay

## 2020-12-14 ENCOUNTER — Ambulatory Visit (INDEPENDENT_AMBULATORY_CARE_PROVIDER_SITE_OTHER): Payer: Medicare HMO

## 2020-12-14 DIAGNOSIS — R519 Headache, unspecified: Secondary | ICD-10-CM | POA: Diagnosis not present

## 2020-12-14 DIAGNOSIS — R413 Other amnesia: Secondary | ICD-10-CM

## 2020-12-14 DIAGNOSIS — H5712 Ocular pain, left eye: Secondary | ICD-10-CM | POA: Diagnosis not present

## 2020-12-14 DIAGNOSIS — D320 Benign neoplasm of cerebral meninges: Secondary | ICD-10-CM

## 2020-12-14 DIAGNOSIS — R6889 Other general symptoms and signs: Secondary | ICD-10-CM | POA: Diagnosis not present

## 2020-12-14 DIAGNOSIS — D329 Benign neoplasm of meninges, unspecified: Secondary | ICD-10-CM | POA: Diagnosis not present

## 2020-12-14 MED ORDER — GADOBUTROL 1 MMOL/ML IV SOLN
7.0000 mL | Freq: Once | INTRAVENOUS | Status: AC | PRN
  Administered 2020-12-14: 7.5 mL via INTRAVENOUS

## 2020-12-24 NOTE — Progress Notes (Signed)
Referring-Catherine Metheney MD Reason for referral-chest pain  HPI: 80 year old female for evaluation of chest pain at request of Beatrice Lecher MD.  Patient seen previously but not since 2013.  Carotid Dopplers 2012 with minimal intimal thickening.  Nuclear study in [place] in May 2017 showed ejection fraction 70%, breast attenuation but no ischemia.  Echocardiogram in [place] January 2019 showed normal LV function, mild left atrial enlargement, mild aortic, mitral and tricuspid regurgitation.  Patient seen back in October with an episode of chest pain.  She states she had chest pressure diffusely including her neck and jaw.  Lasted 5 to 10 minutes and resolve spontaneously.  She was nauseated with it but no dyspnea or diaphoresis.  The pain was not pleuritic.  She has had no symptoms since then.  She denies exertional chest pain.  She has some dyspnea on exertion but no orthopnea, PND or pedal edema.  She has not had syncope.  No palpitations.  Cardiology asked to evaluate.  Note she has multiple medication intolerances including atenolol, ARB's, carvedilol, ACE inhibitor's, Cardizem, verapamil, amlodipine and hydralazine.  Current Outpatient Medications  Medication Sig Dispense Refill  . aspirin 81 MG chewable tablet Chew 81 mg by mouth daily.    . hydrochlorothiazide (MICROZIDE) 12.5 MG capsule Take 1 capsule (12.5 mg total) by mouth daily. 90 capsule 1  . Nebivolol HCl (BYSTOLIC) 20 MG TABS Take 20 mg by mouth in the morning and at bedtime.    . rosuvastatin (CRESTOR) 5 MG tablet Take 1 tablet (5 mg total) by mouth at bedtime. 30 tablet 11   No current facility-administered medications for this visit.    Allergies  Allergen Reactions  . Ace Inhibitors     REACTION: cough Other reaction(s): Abdominal Pain, Chest Pain Patient develops cough, headaches  . Atenolol   . Benicar [Olmesartan Medoxomil] Other (See Comments)    Chest pain  . Carvedilol Diarrhea    Complains of  diarrhea and chest pain.   . Diltiazem Other (See Comments)    Sharp head ache.   . Hydralazine Other (See Comments)    "Chills"   . Losartan Other (See Comments)    headache  . Metoprolol Diarrhea    Abdominal cramping.   . Micardis [Telmisartan] Other (See Comments)    Chest Pain, Back Pain.   [name] Penicillins     REACTION: rash  . Verapamil Other (See Comments)    Headaches and constipation  . Amlodipine Rash    And Headaches     Past Medical History:  Diagnosis Date  . DDD (degenerative disc disease), lumbar    L spine, spinal stenosis  . Gastritis    H Pylori  . Hyperlipidemia   . Hypertension   . Lumbar degenerative disc disease 2002   with stenosis  . Meningioma ([place]) 2010   8 mm in the R tentorium cerebelli  . Menopause    since age 55  . Migraine   . Osteoma    2 mm of the L lid ethmoid air cells  . Thyroid nodule     Past Surgical History:  Procedure Laterality Date  . BREAST BIOPSY Left   . BREAST EXCISIONAL BIOPSY Left   . BREAST SURGERY  1969   Left    Social History   Socioeconomic History  . Marital status: Widowed    Spouse name: Not on file  . Number of children: 3  . Years of education: 68  . Highest education level: Some college,  no degree  Occupational History  . Occupation: Retired.      Employer: [employer]    Comment: Retired  Tobacco Use  . Smoking status: Former Smoker    Types: Cigarettes  . Smokeless tobacco: Former Systems developer    Quit date: 12/05/1962  Vaping Use  . Vaping Use: Never used  Substance and Sexual Activity  . Alcohol use: Yes    Comment: Occasional glass of wine  . Drug use: No  . Sexual activity: Not Currently  Other Topics Concern  . Not on file  Social History Narrative  . Not on file   Social Determinants of Health   Financial Resource Strain: Not on file  Food Insecurity: Not on file  Transportation Needs: Not on file  Physical Activity: Not on file  Stress: Not on file  Social Connections: Not on file   Intimate Partner Violence: Not on file    Family History  Problem Relation Age of Onset  . Hyperlipidemia Mother   . Hypertension Mother   . Diabetes Mother   . Prostate cancer Father   . Hypertension Brother     ROS: no fevers or chills, productive cough, hemoptysis, dysphasia, odynophagia, melena, hematochezia, dysuria, hematuria, rash, seizure activity, orthopnea, PND, pedal edema, claudication. Remaining systems are negative.  Physical Exam:   Blood pressure (!) 215/95, pulse (!) 55, height 5\' 4"  (1.626 m), weight 163 lb 12.8 oz (74.3 kg).  General:  Well developed/well nourished in NAD Skin warm/dry Patient not depressed No peripheral clubbing Back-normal HEENT-normal/normal eyelids Neck supple/normal carotid upstroke bilaterally; no bruits; no JVD; no thyromegaly chest - CTA/ normal expansion CV - RRR/normal S1 and S2; no murmurs, rubs or gallops;  PMI nondisplaced Abdomen -NT/ND, no HSM, no mass, + bowel sounds, no bruit 2+ femoral pulses, no bruits Ext-no edema, chords, 2+ DP Neuro-grossly nonfocal  ECG - 09/10/20 normal sinus rhythm with no ST changes.  Personally reviewed  A/P  1 chest pain-symptoms are somewhat atypical and she has had no recurrences since October.  Electrocardiogram showed no ST changes.  [name] repeating a functional study but she declined.  We will consider further evaluation if she has recurrent symptoms.  2 hypertension-patient's blood pressure is elevated.  However somewhat difficult situation.  She has not tolerated ACE inhibitors, ARB, calcium blockers and the majority of beta-blockers.  She also has not tolerated hydralazine.  We will continue with her present medications and add clonidine 0.1 mg daily.  If she tolerates we will advance.  If not we will consider a different beta-blocker such as labetalol.  3 hyperlipidemia-continue statin.  Kirk Ruths, MD

## 2020-12-30 ENCOUNTER — Other Ambulatory Visit: Payer: Self-pay

## 2020-12-30 ENCOUNTER — Ambulatory Visit: Payer: Medicare HMO | Admitting: Cardiology

## 2020-12-30 ENCOUNTER — Encounter: Payer: Self-pay | Admitting: Cardiology

## 2020-12-30 VITALS — BP 180/95 | HR 55 | Ht 64.0 in | Wt 163.8 lb

## 2020-12-30 DIAGNOSIS — R072 Precordial pain: Secondary | ICD-10-CM | POA: Diagnosis not present

## 2020-12-30 DIAGNOSIS — E7800 Pure hypercholesterolemia, unspecified: Secondary | ICD-10-CM | POA: Diagnosis not present

## 2020-12-30 DIAGNOSIS — I10 Essential (primary) hypertension: Secondary | ICD-10-CM

## 2020-12-30 MED ORDER — CLONIDINE HCL 0.1 MG PO TABS: 0.1000 mg | ORAL_TABLET | Freq: Every day | ORAL | 3 refills | Status: DC

## 2020-12-30 NOTE — Patient Instructions (Signed)
Medication Instructions:   START CLONIDINE 0.1 MG ONCE DAILY  *If you need a refill on your cardiac medications before your next appointment, please call your pharmacy*  Follow-Up: At [facility], you and your health needs are our priority.  As part of our continuing mission to provide you with exceptional heart care, we have created designated Provider Care Teams.  These Care Teams include your primary Cardiologist (physician) and Advanced Practice Providers (APPs -  Physician Assistants and Nurse Practitioners) who all work together to provide you with the care you need, when you need it.  We recommend signing up for the patient portal called "MyChart".  Sign up information is provided on this After Visit Summary.  MyChart is used to connect with patients for Virtual Visits (Telemedicine).  Patients are able to view lab/test results, encounter notes, upcoming appointments, etc.  Non-urgent messages can be sent to your provider as well.   To learn more about what you can do with MyChart, go to [email or web address].    Your next appointment:   3 month(s)  The format for your next appointment:   In Person  Provider:   Kirk Ruths, MD

## 2021-01-25 DIAGNOSIS — I700 Atherosclerosis of aorta: Secondary | ICD-10-CM | POA: Diagnosis not present

## 2021-01-25 DIAGNOSIS — K5900 Constipation, unspecified: Secondary | ICD-10-CM | POA: Diagnosis not present

## 2021-01-25 DIAGNOSIS — R143 Flatulence: Secondary | ICD-10-CM | POA: Diagnosis not present

## 2021-02-04 IMAGING — DX DG WRIST COMPLETE 3+V*R*
4 series · 4 of 4 positions shown · non-contrast
Comparison: None.

CLINICAL DATA: Wrist pain

EXAM:
RIGHT WRIST - COMPLETE 3+ VIEW

[wrist pa]
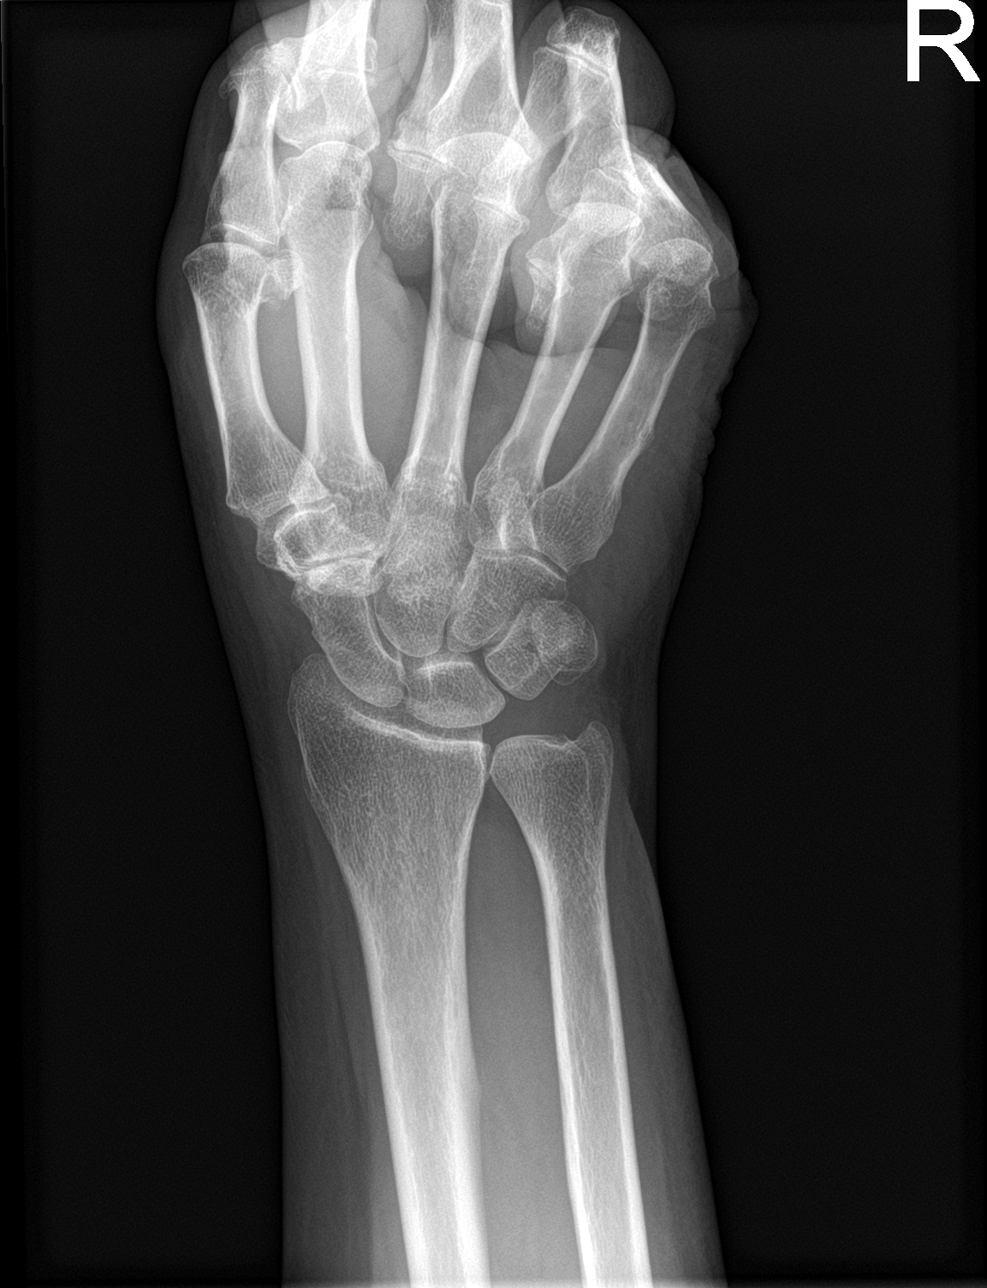

[wrist obl]
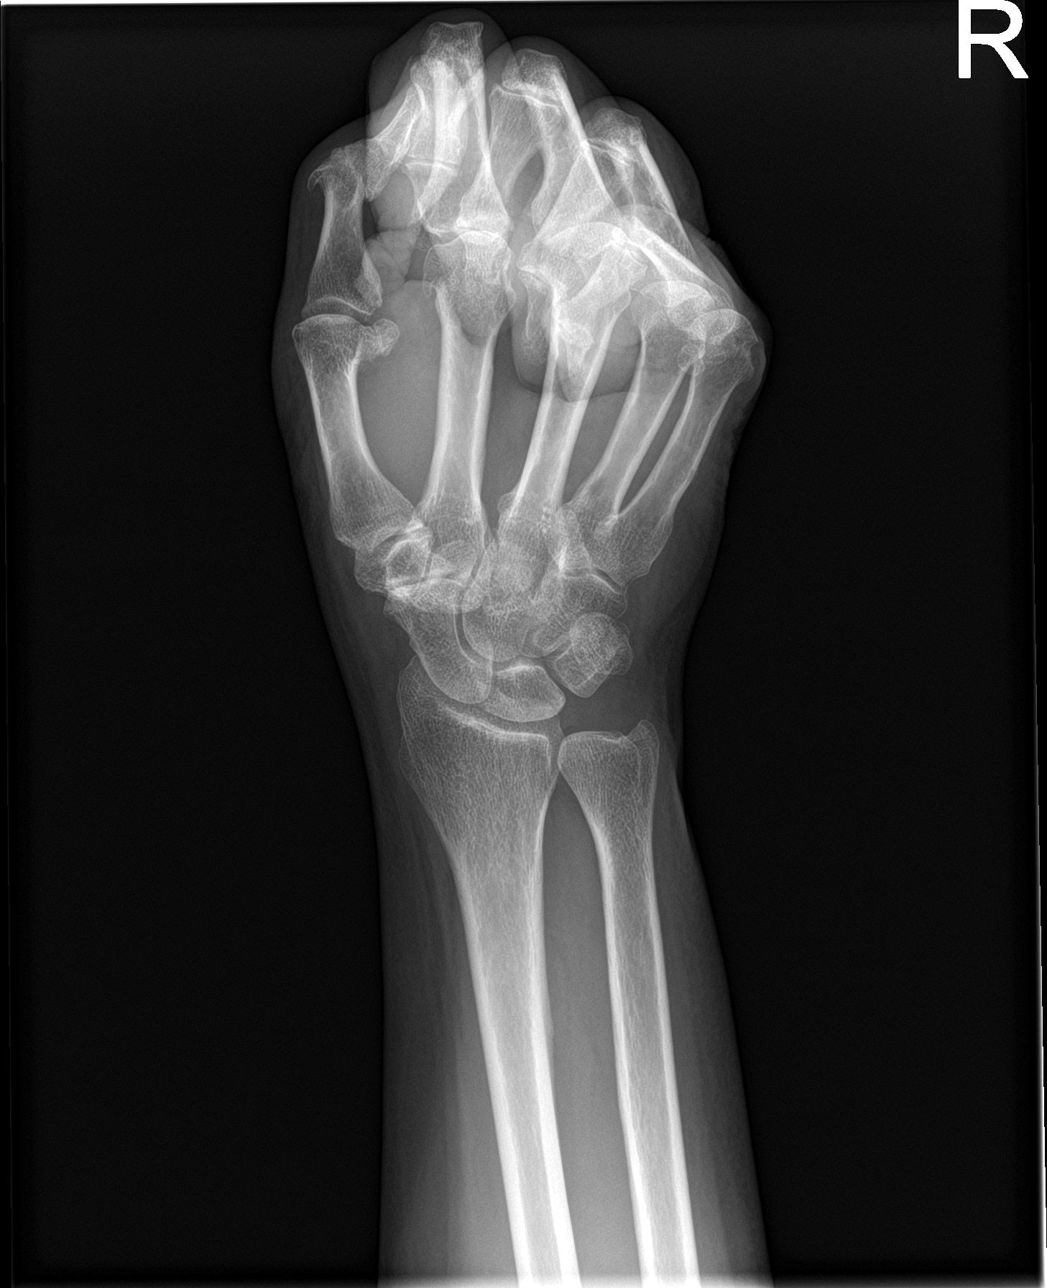

[wrist lat]
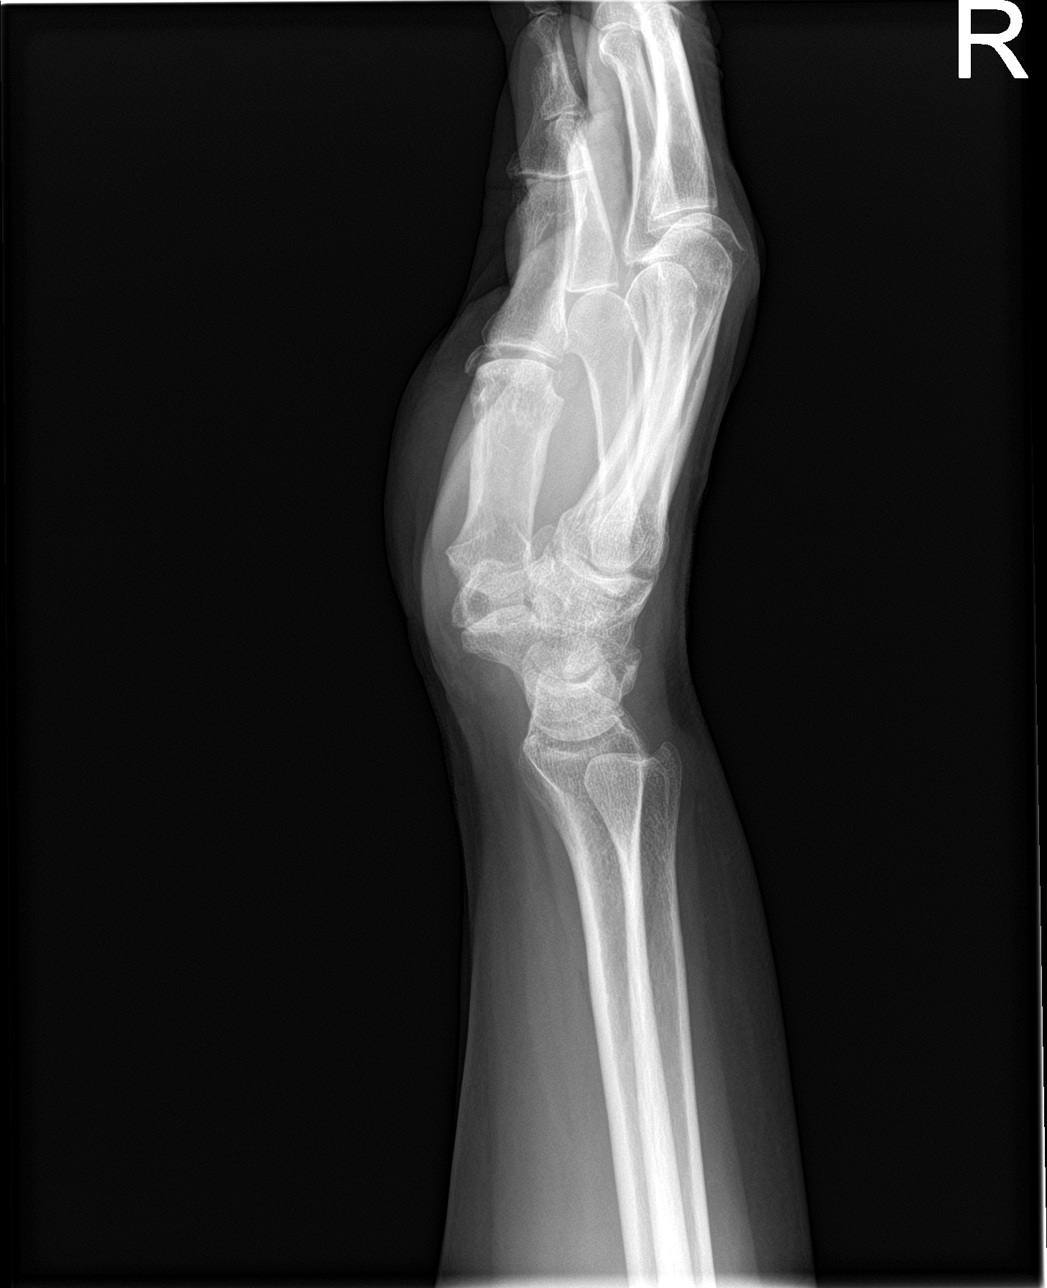

[wrist navicular]
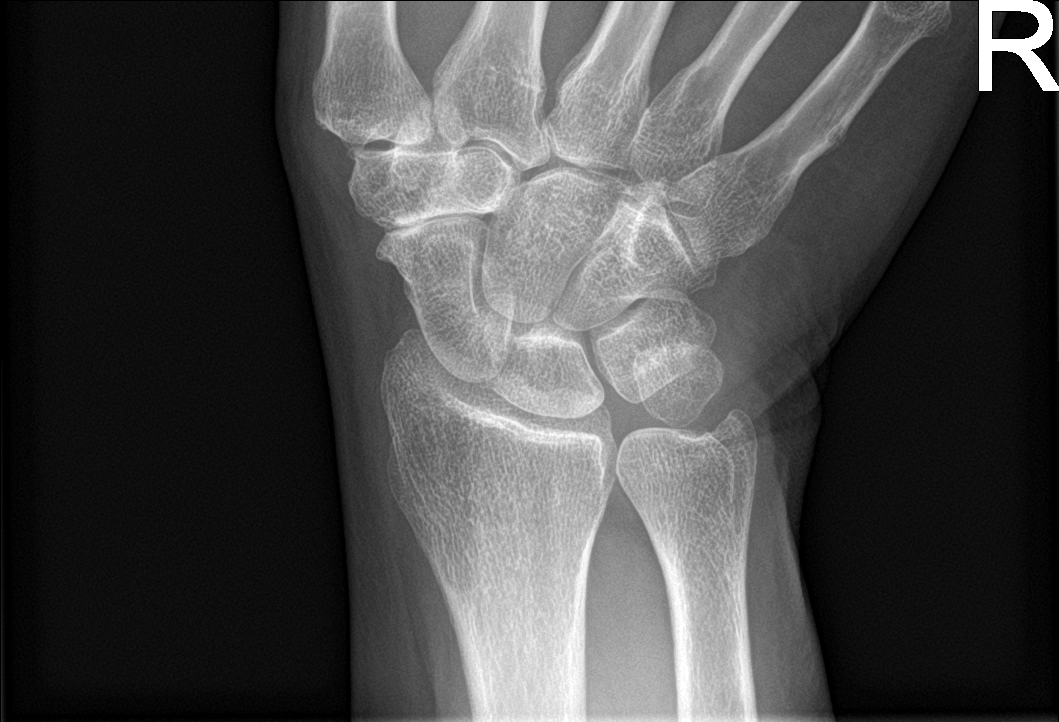

[4 of 4 positions shown; findings below may reference images not displayed]

FINDINGS: Alignment is anatomic. No acute fracture. There are degenerative
changes at the triscaphe joint. No intrinsic osseous lesion
IMPRESSION: Degenerative changes at the triscaphe joint.

## 2021-02-08 ENCOUNTER — Other Ambulatory Visit: Payer: Self-pay

## 2021-02-08 ENCOUNTER — Ambulatory Visit (INDEPENDENT_AMBULATORY_CARE_PROVIDER_SITE_OTHER): Payer: Medicare HMO | Admitting: Family Medicine

## 2021-02-08 ENCOUNTER — Encounter: Payer: Self-pay | Admitting: Family Medicine

## 2021-02-08 VITALS — BP 146/78 | HR 61 | Ht 64.0 in | Wt 163.0 lb

## 2021-02-08 DIAGNOSIS — R143 Flatulence: Secondary | ICD-10-CM

## 2021-02-08 DIAGNOSIS — I10 Essential (primary) hypertension: Secondary | ICD-10-CM

## 2021-02-08 MED ORDER — HYDROCHLOROTHIAZIDE 25 MG PO TABS: 12.5000 mg | ORAL_TABLET | Freq: Every day | ORAL | 3 refills | Status: DC

## 2021-02-08 NOTE — Assessment & Plan Note (Addendum)
P still not well controlled but stable it is typically been in the 150s the last several times that she has been here.  She says she was not willing to take the clonidine because it made her feel cold.  [name] maybe adding labetalol which the cardiologist had suggested but she really wants to start walking more regularly she says she has found a neighbor that she is gone start walking with.  I will see her back in 3 to 4 months and we will recheck her blood pressure at that time.  He also wants to switch from the 12.5 mg hydrochlorothiazide capsule to half a tab of the 25 mg so that she can use a tab instead of a capsule she reports that she is having hard time swallowing and sometimes it feels like it gets stuck or hung.

## 2021-02-08 NOTE — Progress Notes (Signed)
Established Patient Office Visit  Subjective:  Patient ID: Tina Medina, female    DOB: [birth year]  Age: 80 y.o. MRN: [id number]  CC:  Chief Complaint  Patient presents with  . Hypertension    HPI Tina Medina presents for   Follow-up uncontrolled hypertension.  She actually recently saw Dr. Stanford Breed, cardiology, and he recommended a trial of clonidine 0.1 mg.  [place] pressure was over 200 that day.  She says she tried the clonidine and felt really cold.  Like she just could not get warm.  She even tried splitting a half and the sensation was not quite as bad but still felt really cold so stopped it and does not want to take it again  Having a lot of flatulence and stool irregularity and actually saw Dr. Altamese Badger, with gastroenterology.  They have recommended a high-fiber diet to help bulk the stool since she was having some loose stools but also not going daily. They also gave her a bowel cleanout as well and she is staring that.    Past Medical History:  Diagnosis Date  . DDD (degenerative disc disease), lumbar    L spine, spinal stenosis  . Gastritis    H Pylori  . Hyperlipidemia   . Hypertension   . Lumbar degenerative disc disease 2002   with stenosis  . Meningioma ([place]) 2010   8 mm in the R tentorium cerebelli  . Menopause    since age 30  . Migraine   . Osteoma    2 mm of the L lid ethmoid air cells  . Thyroid nodule     Past Surgical History:  Procedure Laterality Date  . BREAST BIOPSY Left   . BREAST EXCISIONAL BIOPSY Left   . BREAST SURGERY  1969   Left    Family History  Problem Relation Age of Onset  . Hyperlipidemia Mother   . Hypertension Mother   . Diabetes Mother   . Prostate cancer Father   . Hypertension Brother     Social History   Socioeconomic History  . Marital status: Widowed    Spouse name: Not on file  . Number of children: 3  . Years of education: 78  . Highest education level: Some college, no degree  Occupational History  .  Occupation: Retired.      Employer: [employer]    Comment: Retired  Tobacco Use  . Smoking status: Former Smoker    Types: Cigarettes  . Smokeless tobacco: Former Systems developer    Quit date: 12/05/1962  Vaping Use  . Vaping Use: Never used  Substance and Sexual Activity  . Alcohol use: Yes    Comment: Occasional glass of wine  . Drug use: No  . Sexual activity: Not Currently  Other Topics Concern  . Not on file  Social History Narrative  . Not on file   Social Determinants of Health   Financial Resource Strain: Not on file  Food Insecurity: Not on file  Transportation Needs: Not on file  Physical Activity: Not on file  Stress: Not on file  Social Connections: Not on file  Intimate Partner Violence: Not on file    Outpatient Medications Prior to Visit  Medication Sig Dispense Refill  . aspirin 81 MG chewable tablet Chew 81 mg by mouth daily.    . Nebivolol HCl 20 MG TABS Take 20 mg by mouth in the morning and at bedtime.    . rosuvastatin (CRESTOR) 5 MG tablet Take 1 tablet (5  mg total) by mouth at bedtime. 30 tablet 11  . hydrochlorothiazide (MICROZIDE) 12.5 MG capsule Take 1 capsule (12.5 mg total) by mouth daily. 90 capsule 1  . cloNIDine (CATAPRES) 0.1 MG tablet Take 1 tablet (0.1 mg total) by mouth daily. (Patient not taking: Reported on 02/08/2021) 90 tablet 3   No facility-administered medications prior to visit.    Allergies  Allergen Reactions  . Ace Inhibitors     REACTION: cough Other reaction(s): Abdominal Pain, Chest Pain Patient develops cough, headaches  . Atenolol   . Benicar [Olmesartan Medoxomil] Other (See Comments)    Chest pain  . Carvedilol Diarrhea    Complains of diarrhea and chest pain.   . Clonidine Derivatives Other (See Comments)  . Diltiazem Other (See Comments)    Sharp head ache.   . Hydralazine Other (See Comments)    "Chills"   . Losartan Other (See Comments)    headache  . Metoprolol Diarrhea    Abdominal cramping.   . Micardis  [Telmisartan] Other (See Comments)    Chest Pain, Back Pain.   [name] Penicillins     REACTION: rash  . Verapamil Other (See Comments)    Headaches and constipation  . Amlodipine Rash    And Headaches    ROS Review of Systems    Objective:    Physical Exam Constitutional:      Appearance: She is well-developed and well-nourished.  HENT:     Head: Normocephalic and atraumatic.  Cardiovascular:     Rate and Rhythm: Normal rate and regular rhythm.     Heart sounds: Normal heart sounds.  Pulmonary:     Effort: Pulmonary effort is normal.     Breath sounds: Normal breath sounds.  Skin:    General: Skin is warm and dry.  Neurological:     Mental Status: She is alert and oriented to person, place, and time.  Psychiatric:        Mood and Affect: Mood and affect normal.        Behavior: Behavior normal.     BP (!) 146/78   Pulse 61   Ht 5\' 4"  (1.626 m)   Wt 163 lb (73.9 kg)   SpO2 99%   BMI 27.98 kg/m  Wt Readings from Last 3 Encounters:  02/08/21 163 lb (73.9 kg)  12/30/20 163 lb 12.8 oz (74.3 kg)  12/09/20 162 lb (73.5 kg)     Health Maintenance Due  Topic Date Due  . TETANUS/TDAP  12/05/2014    There are no preventive care reminders to display for this patient.  Lab Results  Component Value Date   TSH 1.45 10/09/2019   Lab Results  Component Value Date   WBC 7.6 11/24/2020   HGB 14.7 11/24/2020   HCT 42.5 11/24/2020   MCV 91.8 11/24/2020   PLT 327 11/24/2020   Lab Results  Component Value Date   NA 142 11/24/2020   K 4.7 11/24/2020   CO2 33 (H) 11/24/2020   GLUCOSE 102 (H) 11/24/2020   BUN 14 11/24/2020   CREATININE 1.06 (H) 11/24/2020   BILITOT 0.4 11/24/2020   ALKPHOS 66 01/30/2015   AST 20 11/24/2020   ALT 10 11/24/2020   PROT 7.4 11/24/2020   ALBUMIN 4.1 01/30/2015   CALCIUM 10.2 11/24/2020   Lab Results  Component Value Date   CHOL 200 (H) 09/11/2020   Lab Results  Component Value Date   HDL 42 (L) 09/11/2020   Lab Results   Component Value  Date   LDLCALC 135 (H) 09/11/2020   Lab Results  Component Value Date   TRIG 115 09/11/2020   Lab Results  Component Value Date   CHOLHDL 4.8 09/11/2020   Lab Results  Component Value Date   HGBA1C 5.4 09/03/2018      Assessment & Plan:   Problem List Items Addressed This Visit      Cardiovascular and Mediastinum   ESSENTIAL HYPERTENSION, BENIGN - Primary    P still not well controlled but stable it is typically been in the 150s the last several times that she has been here.  She says she was not willing to take the clonidine because it made her feel cold.  [name] maybe adding labetalol which the cardiologist had suggested but she really wants to start walking more regularly she says she has found a neighbor that she is gone start walking with.  I will see her back in 3 to 4 months and we will recheck her blood pressure at that time.  He also wants to switch from the 12.5 mg hydrochlorothiazide capsule to half a tab of the 25 mg so that she can use a tab instead of a capsule she reports that she is having hard time swallowing and sometimes it feels like it gets stuck or hung.      Relevant Medications   hydrochlorothiazide (HYDRODIURIL) 25 MG tablet    Other Visit Diagnoses    Flatulence          Flatulence-hopefully will improve when she does the cleanout.  She brought the instructions in with her today and says she plans on doing it soon.  Meds ordered this encounter  Medications  . hydrochlorothiazide (HYDRODIURIL) 25 MG tablet    Sig: Take 0.5 tablets (12.5 mg total) by mouth daily.    Dispense:  45 tablet    Refill:  3    Having hard time swallowing the the 12.5 capsule    Follow-up: Return in about 4 months (around 06/10/2021) for Hypertension.    Beatrice Lecher, MD

## 2021-02-11 DIAGNOSIS — H524 Presbyopia: Secondary | ICD-10-CM | POA: Diagnosis not present

## 2021-02-17 ENCOUNTER — Other Ambulatory Visit: Payer: Self-pay | Admitting: Family Medicine

## 2021-02-17 DIAGNOSIS — I10 Essential (primary) hypertension: Secondary | ICD-10-CM

## 2021-03-02 ENCOUNTER — Other Ambulatory Visit: Payer: Self-pay | Admitting: Family Medicine

## 2021-03-02 DIAGNOSIS — Z1231 Encounter for screening mammogram for malignant neoplasm of breast: Secondary | ICD-10-CM

## 2021-03-24 DIAGNOSIS — J439 Emphysema, unspecified: Secondary | ICD-10-CM | POA: Diagnosis not present

## 2021-03-24 DIAGNOSIS — R143 Flatulence: Secondary | ICD-10-CM | POA: Diagnosis not present

## 2021-03-24 DIAGNOSIS — K5900 Constipation, unspecified: Secondary | ICD-10-CM | POA: Diagnosis not present

## 2021-03-29 NOTE — Progress Notes (Deleted)
HPI: FU CP.  Carotid Dopplers 2012 with minimal intimal thickening.  Nuclear study in [place] in May 2017 showed ejection fraction 70%, breast attenuation but no ischemia. Echocardiogram in [place] January 2019 showed normal LV function, mild left atrial enlargement, mild aortic, mitral and tricuspid regurgitation.  Note she has multiple medication intolerances including atenolol, ARB's, carvedilol, ACE inhibitor's, Cardizem, verapamil, amlodipine and hydralazine.  At last office visit patient had been seen for an episode of chest pain last fall.  [name] a functional study but she declined.  Since last seen  Current Outpatient Medications  Medication Sig Dispense Refill  . aspirin 81 MG chewable tablet Chew 81 mg by mouth daily.    . hydrochlorothiazide (HYDRODIURIL) 25 MG tablet Take 0.5 tablets (12.5 mg total) by mouth daily. 45 tablet 3  . Nebivolol HCl 20 MG TABS Take 20 mg by mouth in the morning and at bedtime.    . rosuvastatin (CRESTOR) 5 MG tablet Take 1 tablet (5 mg total) by mouth at bedtime. 30 tablet 11   No current facility-administered medications for this visit.     Past Medical History:  Diagnosis Date  . DDD (degenerative disc disease), lumbar    L spine, spinal stenosis  . Gastritis    H Pylori  . Hyperlipidemia   . Hypertension   . Lumbar degenerative disc disease 2002   with stenosis  . Meningioma ([facility]) 2010   8 mm in the R tentorium cerebelli  . Menopause    since age 57  . Migraine   . Osteoma    2 mm of the L lid ethmoid air cells  . Thyroid nodule     Past Surgical History:  Procedure Laterality Date  . BREAST BIOPSY Left   . BREAST EXCISIONAL BIOPSY Left   . BREAST SURGERY  1969   Left    Social History   Socioeconomic History  . Marital status: Widowed    Spouse name: Not on file  . Number of children: 3  . Years of education: 5  . Highest education level: Some college, no degree  Occupational History  . Occupation:  Retired.      Employer: [employer]    Comment: Retired  Tobacco Use  . Smoking status: Former Smoker    Types: Cigarettes  . Smokeless tobacco: Former Systems developer    Quit date: 12/05/1962  Vaping Use  . Vaping Use: Never used  Substance and Sexual Activity  . Alcohol use: Yes    Comment: Occasional glass of wine  . Drug use: No  . Sexual activity: Not Currently  Other Topics Concern  . Not on file  Social History Narrative  . Not on file   Social Determinants of Health   Financial Resource Strain: Not on file  Food Insecurity: Not on file  Transportation Needs: Not on file  Physical Activity: Not on file  Stress: Not on file  Social Connections: Not on file  Intimate Partner Violence: Not on file    Family History  Problem Relation Age of Onset  . Hyperlipidemia Mother   . Hypertension Mother   . Diabetes Mother   . Prostate cancer Father   . Hypertension Brother     ROS: no fevers or chills, productive cough, hemoptysis, dysphasia, odynophagia, melena, hematochezia, dysuria, hematuria, rash, seizure activity, orthopnea, PND, pedal edema, claudication. Remaining systems are negative.  Physical Exam: Well-developed well-nourished in no acute distress.  Skin is warm and dry.  HEENT is  normal.  Neck is supple.  Chest is clear to auscultation with normal expansion.  Cardiovascular exam is regular rate and rhythm.  Abdominal exam nontender or distended. No masses palpated. Extremities show no edema. neuro grossly intact  ECG- personally reviewed  A/P  1 chest pain-  2 hypertension-difficult situation as patient has hypertension but she has not tolerated ACE inhibitors, ARB's, calcium blockers and the majority of beta-blockers.  Also did not tolerate hydralazine.  We will continue clonidine.  3 hyperlipidemia-continue statin.  Kirk Ruths, MD

## 2021-04-07 ENCOUNTER — Ambulatory Visit: Payer: Medicare HMO | Admitting: Cardiology

## 2021-04-13 ENCOUNTER — Other Ambulatory Visit: Payer: Self-pay

## 2021-04-13 ENCOUNTER — Encounter: Payer: Self-pay | Admitting: Family Medicine

## 2021-04-13 ENCOUNTER — Ambulatory Visit (INDEPENDENT_AMBULATORY_CARE_PROVIDER_SITE_OTHER): Payer: Medicare HMO | Admitting: Family Medicine

## 2021-04-13 VITALS — BP 168/88 | HR 58 | Ht 64.0 in | Wt 160.0 lb

## 2021-04-13 DIAGNOSIS — I270 Primary pulmonary hypertension: Secondary | ICD-10-CM | POA: Diagnosis not present

## 2021-04-13 DIAGNOSIS — H6191 Disorder of right external ear, unspecified: Secondary | ICD-10-CM

## 2021-04-13 DIAGNOSIS — N1831 Chronic kidney disease, stage 3a: Secondary | ICD-10-CM | POA: Diagnosis not present

## 2021-04-13 DIAGNOSIS — I209 Angina pectoris, unspecified: Secondary | ICD-10-CM

## 2021-04-13 NOTE — Progress Notes (Signed)
Pt reports that she had been working out in the yard and [place]

## 2021-04-13 NOTE — Assessment & Plan Note (Addendum)
Did discuss that she does have CKD 3.  Her creatinine has been around 0.9 for the least the last 7 years may [place] longer.  Over the last 2 years her creatinine has been around 1.0.  [name] the importance of really keeping her blood pressure under best control as possible.  Unfortunately, she has had multiple intolerances to blood pressure medications and what she is currently on is really about all she can take.  I even referred her to cardiology previously to have them try to make adjustments.  Lan to follow renal function every 6 months.

## 2021-04-13 NOTE — Assessment & Plan Note (Signed)
Did discuss on echo in 2019 done in [place] she was noted to have some mild pulmonary hypertension.  Reviewed that information with her today.

## 2021-04-13 NOTE — Assessment & Plan Note (Signed)
Gust diagnosis.  She does have recurrent chest pain.  We will keep this on her problem list for now.

## 2021-04-13 NOTE — Progress Notes (Signed)
Acute Office Visit  Subjective:    Patient ID: Tina Medina, female    DOB: 1941/10/05, 80 y.o.   MRN: [id number]  Chief Complaint  Patient presents with  . ear lobe swelling    HPI Patient is in today for ear lobe pain. Started a week ago.  Put alcohol on it and then popped it.  She says it has felt a little better and is gotten a little smaller.  She is not sure if maybe something bit her in the yard she has been out doing a lot of yard work.  She did see if you problems on her problem list that she wanted to address today including pulmonary hypertension, angina pectoris, colon polyps, zonular dehiscence, and CKD.  Has been looking for an apartment/place to move in [place] close to where her children are she would like to get back there if possible.  But cannot find anything that is within her financial range.  Everything is so expensive down there.  Past Medical History:  Diagnosis Date  . DDD (degenerative disc disease), lumbar    L spine, spinal stenosis  . Gastritis    H Pylori  . Hyperlipidemia   . Hypertension   . Lumbar degenerative disc disease 2002   with stenosis  . Meningioma ([facility]) 2010   8 mm in the R tentorium cerebelli  . Menopause    since age 45  . Migraine   . Osteoma    2 mm of the L lid ethmoid air cells  . Thyroid nodule     Past Surgical History:  Procedure Laterality Date  . BREAST BIOPSY Left   . BREAST EXCISIONAL BIOPSY Left   . BREAST SURGERY  1969   Left    Family History  Problem Relation Age of Onset  . Hyperlipidemia Mother   . Hypertension Mother   . Diabetes Mother   . Prostate cancer Father   . Hypertension Brother     Social History   Socioeconomic History  . Marital status: Widowed    Spouse name: Not on file  . Number of children: 3  . Years of education: 42  . Highest education level: Some college, no degree  Occupational History  . Occupation: Retired.      Employer: [employer]    Comment: Retired  Tobacco Use   . Smoking status: Former Smoker    Types: Cigarettes  . Smokeless tobacco: Former Systems developer    Quit date: 12/05/1962  Vaping Use  . Vaping Use: Never used  Substance and Sexual Activity  . Alcohol use: Yes    Comment: Occasional glass of wine  . Drug use: No  . Sexual activity: Not Currently  Other Topics Concern  . Not on file  Social History Narrative  . Not on file   Social Determinants of Health   Financial Resource Strain: Not on file  Food Insecurity: Not on file  Transportation Needs: Not on file  Physical Activity: Not on file  Stress: Not on file  Social Connections: Not on file  Intimate Partner Violence: Not on file    Outpatient Medications Prior to Visit  Medication Sig Dispense Refill  . aspirin 81 MG chewable tablet Chew 81 mg by mouth daily.    . hydrochlorothiazide (HYDRODIURIL) 25 MG tablet Take 0.5 tablets (12.5 mg total) by mouth daily. 45 tablet 3  . Nebivolol HCl 20 MG TABS Take 20 mg by mouth in the morning and at bedtime.    [name]  rosuvastatin (CRESTOR) 5 MG tablet Take 1 tablet (5 mg total) by mouth at bedtime. 30 tablet 11   No facility-administered medications prior to visit.    Allergies  Allergen Reactions  . Ace Inhibitors     REACTION: cough Other reaction(s): Abdominal Pain, Chest Pain Patient develops cough, headaches  . Atenolol   . Benicar [Olmesartan Medoxomil] Other (See Comments)    Chest pain  . Carvedilol Diarrhea    Complains of diarrhea and chest pain.   . Clonidine Derivatives Other (See Comments)  . Diltiazem Other (See Comments)    Sharp head ache.   . Hydralazine Other (See Comments)    "Chills"   . Losartan Other (See Comments)    headache  . Metoprolol Diarrhea    Abdominal cramping.   . Micardis [Telmisartan] Other (See Comments)    Chest Pain, Back Pain.   [name] Penicillins     REACTION: rash  . Verapamil Other (See Comments)    Headaches and constipation  . Amlodipine Rash    And Headaches    Review of  Systems     Objective:    Physical Exam Vitals reviewed.  Constitutional:      Appearance: She is well-developed.  HENT:     Head: Normocephalic and atraumatic.  Eyes:     Conjunctiva/sclera: Conjunctivae normal.  Cardiovascular:     Rate and Rhythm: Normal rate.  Pulmonary:     Effort: Pulmonary effort is normal.  Skin:    General: Skin is dry.     Coloration: Skin is not pale.     Comments: She has small papule on the right middle ear with a little bit of peeling skin where some of the swelling has gone down.  No open drainage.  Neurological:     Mental Status: She is alert and oriented to person, place, and time.  Psychiatric:        Behavior: Behavior normal.     BP (!) 168/88   Pulse (!) 58   Ht 5\' 4"  (1.626 m)   Wt 160 lb (72.6 kg)   SpO2 99%   BMI 27.46 kg/m  Wt Readings from Last 3 Encounters:  04/13/21 160 lb (72.6 kg)  02/08/21 163 lb (73.9 kg)  12/30/20 163 lb 12.8 oz (74.3 kg)    Health Maintenance Due  Topic Date Due  . TETANUS/TDAP  12/05/2014    There are no preventive care reminders to display for this patient.   Lab Results  Component Value Date   TSH 1.45 10/09/2019   Lab Results  Component Value Date   WBC 7.6 11/24/2020   HGB 14.7 11/24/2020   HCT 42.5 11/24/2020   MCV 91.8 11/24/2020   PLT 327 11/24/2020   Lab Results  Component Value Date   NA 142 11/24/2020   K 4.7 11/24/2020   CO2 33 (H) 11/24/2020   GLUCOSE 102 (H) 11/24/2020   BUN 14 11/24/2020   CREATININE 1.06 (H) 11/24/2020   BILITOT 0.4 11/24/2020   ALKPHOS 66 01/30/2015   AST 20 11/24/2020   ALT 10 11/24/2020   PROT 7.4 11/24/2020   ALBUMIN 4.1 01/30/2015   CALCIUM 10.2 11/24/2020   Lab Results  Component Value Date   CHOL 200 (H) 09/11/2020   Lab Results  Component Value Date   HDL 42 (L) 09/11/2020   Lab Results  Component Value Date   LDLCALC 135 (H) 09/11/2020   Lab Results  Component Value Date   TRIG 115 09/11/2020  Lab Results   Component Value Date   CHOLHDL 4.8 09/11/2020   Lab Results  Component Value Date   HGBA1C 5.4 09/03/2018       Assessment & Plan:   Problem List Items Addressed This Visit      Cardiovascular and Mediastinum   Pulmonary hypertension, primary ([facility])    Did discuss on echo in 2019 done in [place] she was noted to have some mild pulmonary hypertension.  Reviewed that information with her today.      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  She does have recurrent chest pain.  We will keep this on her problem list for now.        Genitourinary   CKD (chronic kidney disease) stage 3, GFR 30-59 ml/min (HCC)    Did discuss that she does have CKD 3.  Her creatinine has been around 0.9 for the least the last 7 years may [place] longer.  Over the last 2 years her creatinine has been around 1.0.  [name] the importance of really keeping her blood pressure under best control as possible.  Unfortunately, she has had multiple intolerances to blood pressure medications and what she is currently on is really about all she can take.  I even referred her to cardiology previously to have them try to make adjustments.  Lan to follow renal function every 6 months.       Other Visit Diagnoses    Earlobe lesion, right    -  Primary     Lesion on her ear left-suspect small abscess either from a insect bite or a clogged follicle.  But it seems to be healing well on its own.   No orders of the defined types were placed in this encounter.  I spent 30 minutes on the day of the encounter to include pre-visit record review, face-to-face time with the patient and post visit ordering of test.   Beatrice Lecher, MD

## 2021-04-16 ENCOUNTER — Telehealth: Payer: Self-pay | Admitting: *Deleted

## 2021-04-21 ENCOUNTER — Ambulatory Visit: Payer: Medicare HMO

## 2021-05-04 NOTE — Telephone Encounter (Signed)
error 

## 2021-05-05 ENCOUNTER — Other Ambulatory Visit: Payer: Self-pay

## 2021-05-05 ENCOUNTER — Ambulatory Visit (INDEPENDENT_AMBULATORY_CARE_PROVIDER_SITE_OTHER): Payer: Medicare HMO

## 2021-05-05 DIAGNOSIS — Z1231 Encounter for screening mammogram for malignant neoplasm of breast: Secondary | ICD-10-CM | POA: Diagnosis not present

## 2021-05-26 DIAGNOSIS — R1013 Epigastric pain: Secondary | ICD-10-CM | POA: Diagnosis not present

## 2021-05-26 DIAGNOSIS — K5900 Constipation, unspecified: Secondary | ICD-10-CM | POA: Diagnosis not present

## 2021-05-26 DIAGNOSIS — R143 Flatulence: Secondary | ICD-10-CM | POA: Diagnosis not present

## 2021-06-08 ENCOUNTER — Other Ambulatory Visit: Payer: Self-pay | Admitting: Family Medicine

## 2021-06-10 ENCOUNTER — Other Ambulatory Visit: Payer: Self-pay

## 2021-06-10 ENCOUNTER — Encounter: Payer: Self-pay | Admitting: Family Medicine

## 2021-06-10 ENCOUNTER — Ambulatory Visit (INDEPENDENT_AMBULATORY_CARE_PROVIDER_SITE_OTHER): Payer: Medicare HMO | Admitting: Family Medicine

## 2021-06-10 VITALS — BP 178/88 | HR 53 | Ht 64.0 in | Wt 162.0 lb

## 2021-06-10 DIAGNOSIS — I10 Essential (primary) hypertension: Secondary | ICD-10-CM | POA: Diagnosis not present

## 2021-06-10 DIAGNOSIS — R29898 Other symptoms and signs involving the musculoskeletal system: Secondary | ICD-10-CM | POA: Diagnosis not present

## 2021-06-10 DIAGNOSIS — N644 Mastodynia: Secondary | ICD-10-CM | POA: Diagnosis not present

## 2021-06-10 DIAGNOSIS — I209 Angina pectoris, unspecified: Secondary | ICD-10-CM

## 2021-06-10 DIAGNOSIS — M542 Cervicalgia: Secondary | ICD-10-CM | POA: Diagnosis not present

## 2021-06-10 DIAGNOSIS — R0602 Shortness of breath: Secondary | ICD-10-CM

## 2021-06-10 DIAGNOSIS — N1831 Chronic kidney disease, stage 3a: Secondary | ICD-10-CM

## 2021-06-10 LAB — BASIC METABOLIC PANEL WITH GFR
BUN/Creatinine Ratio: 10 (calc) (ref 6–22)
BUN: 12 mg/dL (ref 7–25)
CO2: 30 mmol/L (ref 20–32)
Calcium: 10 mg/dL (ref 8.6–10.4)
Chloride: 106 mmol/L (ref 98–110)
Creat: 1.21 mg/dL — ABNORMAL HIGH (ref 0.60–0.88)
GFR, Est African American: 48 mL/min/{1.73_m2} — ABNORMAL LOW (ref >=60)
GFR, Est Non African American: 42 mL/min/{1.73_m2} — ABNORMAL LOW (ref >=60)
Glucose, Bld: 81 mg/dL (ref 65–139)
Potassium: 4.7 mmol/L (ref 3.5–5.3)
Sodium: 142 mmol/L (ref 135–146)

## 2021-06-10 NOTE — Progress Notes (Signed)
Established Patient Office Visit  Subjective:  Patient ID: Tina Medina, female    DOB: 12-18-1940  Age: 80 y.o. MRN: [id number]  CC:  Chief Complaint  Patient presents with   Hypertension    HPI Tina Medina presents for   Hypertension- Pt denies chest pain, SOB, dizziness, or heart palpitations.  Taking meds as directed w/o problems.  Denies medication side effects.    Intermittently c/o of Right ankle burning and feels weak.  Has had on and off for over a year.  No prior injury or trauma.  C/O of pelvic pain and breast tenderness.  She denies any breast swelling or change in breast size.  No recent trauma she has had some pain in that left breast on and off over the years.  She had a lumpectomy in that breast years ago.  But she is never had pain in the nipple and this the first time this is happened.  Her last mammogram was 4 weeks ago on June 1 and it was normal.  Also complained of an episode of shortness of breath while she was mopping in her home.  Does not usually get short of breath with activity.  Also complains of bilateral anterior neck pain just near the sternocleidomastoids.  Radiating down into the neck.  Again no recent injury or trauma.   Past Medical History:  Diagnosis Date   DDD (degenerative disc disease), lumbar    L spine, spinal stenosis   Gastritis    H Pylori   Hyperlipidemia    Hypertension    Lumbar degenerative disc disease 2002   with stenosis   Meningioma ([facility]) 2010   8 mm in the R tentorium cerebelli   Menopause    since age 15   Migraine    Osteoma    2 mm of the L lid ethmoid air cells   Thyroid nodule     Past Surgical History:  Procedure Laterality Date   BREAST BIOPSY Left    BREAST EXCISIONAL BIOPSY Left    BREAST SURGERY  1969   Left    Family History  Problem Relation Age of Onset   Hyperlipidemia Mother    Hypertension Mother    Diabetes Mother    Prostate cancer Father    Hypertension Brother     Social  History   Socioeconomic History   Marital status: Widowed    Spouse name: Not on file   Number of children: 3   Years of education: 25   Highest education level: Some college, no degree  Occupational History   Occupation: Retired.      Employer: [employer]    Comment: Retired  Tobacco Use   Smoking status: Former    Pack years: 0.00    Types: Cigarettes   Smokeless tobacco: Former    Quit date: 12/05/1962  Vaping Use   Vaping Use: Never used  Substance and Sexual Activity   Alcohol use: Yes    Comment: Occasional glass of wine   Drug use: No   Sexual activity: Not Currently  Other Topics Concern   Not on file  Social History Narrative   Not on file   Social Determinants of Health   Financial Resource Strain: Not on file  Food Insecurity: Not on file  Transportation Needs: Not on file  Physical Activity: Not on file  Stress: Not on file  Social Connections: Not on file  Intimate Partner Violence: Not on file    Outpatient Medications  Prior to Visit  Medication Sig Dispense Refill   aspirin 81 MG chewable tablet Chew 81 mg by mouth daily.     hydrochlorothiazide (HYDRODIURIL) 25 MG tablet Take 0.5 tablets (12.5 mg total) by mouth daily. 45 tablet 3   Nebivolol HCl 20 MG TABS TAKE 1 TABLET BY MOUTH TWICE A DAY 60 tablet 0   rosuvastatin (CRESTOR) 5 MG tablet Take 1 tablet (5 mg total) by mouth at bedtime. 30 tablet 11   No facility-administered medications prior to visit.    Allergies  Allergen Reactions   Ace Inhibitors     REACTION: cough Other reaction(s): Abdominal Pain, Chest Pain Patient develops cough, headaches   Atenolol    Benicar [Olmesartan Medoxomil] Other (See Comments)    Chest pain   Carvedilol Diarrhea    Complains of diarrhea and chest pain.    Clonidine Derivatives Other (See Comments)   Diltiazem Other (See Comments)    Sharp head ache.    Hydralazine Other (See Comments)    "Chills"    Losartan Other (See Comments)    headache    Metoprolol Diarrhea    Abdominal cramping.    Micardis [Telmisartan] Other (See Comments)    Chest Pain, Back Pain.    Penicillins     REACTION: rash   Verapamil Other (See Comments)    Headaches and constipation   Amlodipine Rash    And Headaches    ROS Review of Systems    Objective:    Physical Exam Constitutional:      Appearance: Normal appearance. She is well-developed.  HENT:     Head: Normocephalic and atraumatic.  Cardiovascular:     Rate and Rhythm: Normal rate and regular rhythm.     Heart sounds: Normal heart sounds.  Pulmonary:     Effort: Pulmonary effort is normal.     Breath sounds: Normal breath sounds.  Musculoskeletal:     Comments: Right ankle with normal ROM and strength is 5/5 bilaterally  Skin:    General: Skin is warm and dry.  Neurological:     Mental Status: She is alert and oriented to person, place, and time.  Psychiatric:        Behavior: Behavior normal.    BP (!) 178/88   Pulse (!) 53   Ht 5\' 4"  (1.626 m)   Wt 162 lb (73.5 kg)   SpO2 100%   BMI 27.81 kg/m  Wt Readings from Last 3 Encounters:  06/10/21 162 lb (73.5 kg)  04/13/21 160 lb (72.6 kg)  02/08/21 163 lb (73.9 kg)     Health Maintenance Due  Topic Date Due   Zoster Vaccines- Shingrix (1 of 2) Never done   TETANUS/TDAP  12/05/2014   COVID-19 Vaccine (4 - Booster for Moderna series) 01/29/2021    There are no preventive care reminders to display for this patient.  Lab Results  Component Value Date   TSH 1.45 10/09/2019   Lab Results  Component Value Date   WBC 7.6 11/24/2020   HGB 14.7 11/24/2020   HCT 42.5 11/24/2020   MCV 91.8 11/24/2020   PLT 327 11/24/2020   Lab Results  Component Value Date   NA 142 11/24/2020   K 4.7 11/24/2020   CO2 33 (H) 11/24/2020   GLUCOSE 102 (H) 11/24/2020   BUN 14 11/24/2020   CREATININE 1.06 (H) 11/24/2020   BILITOT 0.4 11/24/2020   ALKPHOS 66 01/30/2015   AST 20 11/24/2020   ALT 10 11/24/2020  PROT 7.4 11/24/2020    ALBUMIN 4.1 01/30/2015   CALCIUM 10.2 11/24/2020   Lab Results  Component Value Date   CHOL 200 (H) 09/11/2020   Lab Results  Component Value Date   HDL 42 (L) 09/11/2020   Lab Results  Component Value Date   LDLCALC 135 (H) 09/11/2020   Lab Results  Component Value Date   TRIG 115 09/11/2020   Lab Results  Component Value Date   CHOLHDL 4.8 09/11/2020   Lab Results  Component Value Date   HGBA1C 5.4 09/03/2018      Assessment & Plan:   Problem List Items Addressed This Visit       Cardiovascular and Mediastinum   ESSENTIAL HYPERTENSION, BENIGN - Primary    BP not well controlled. Have consulted with cards multiple times and her current regimen are the meds that she has been able to take.         Relevant Orders   BASIC METABOLIC PANEL WITH GFR   US Carotid Duplex Bilateral   Angina pectoris (HCC)   Relevant Orders   US Carotid Duplex Bilateral     Genitourinary   CKD (chronic kidney disease) stage 3, GFR 30-59 ml/min (HCC)    Due for BMP today.        Other Visit Diagnoses     Neck pain       Relevant Orders   US Carotid Duplex Bilateral   Ankle weakness       SOB (shortness of breath)       Breast pain, left          Right ankle weakness - normal exam today. Given H.O for stretches and recommend band exercises for strengthening.  Can refer to ortho of not helping.    Neck pain -  with known CAD and HTN will schedule for carotid dopplers.   SOB - clear lung exam today. Discussed continuing to work on consistent exercise.  Has had cardiac workup.    Left breast pain - we could consider testing hormones.  Recent mammo is normal.   No orders of the defined types were placed in this encounter.   Follow-up: Return in about 6 months (around 12/11/2021) for Hypertension.    Beatrice Lecher, MD

## 2021-06-10 NOTE — Assessment & Plan Note (Signed)
Due for BMP today.

## 2021-06-10 NOTE — Assessment & Plan Note (Signed)
BP not well controlled. Have consulted with cards multiple times and her current regimen are the meds that she has been able to take.

## 2021-06-11 ENCOUNTER — Other Ambulatory Visit: Payer: Self-pay | Admitting: Family Medicine

## 2021-06-11 MED ORDER — NEBIVOLOL HCL 20 MG PO TABS: 1.0000 | ORAL_TABLET | Freq: Two times a day (BID) | ORAL | 1 refills | Status: DC

## 2021-06-15 ENCOUNTER — Ambulatory Visit (HOSPITAL_BASED_OUTPATIENT_CLINIC_OR_DEPARTMENT_OTHER): Payer: Medicare HMO

## 2021-06-15 ENCOUNTER — Encounter (HOSPITAL_BASED_OUTPATIENT_CLINIC_OR_DEPARTMENT_OTHER): Payer: Self-pay

## 2021-06-18 ENCOUNTER — Ambulatory Visit (INDEPENDENT_AMBULATORY_CARE_PROVIDER_SITE_OTHER): Payer: Medicare HMO

## 2021-06-18 ENCOUNTER — Other Ambulatory Visit: Payer: Self-pay

## 2021-06-18 DIAGNOSIS — I209 Angina pectoris, unspecified: Secondary | ICD-10-CM | POA: Diagnosis not present

## 2021-06-18 DIAGNOSIS — E782 Mixed hyperlipidemia: Secondary | ICD-10-CM | POA: Diagnosis not present

## 2021-06-18 DIAGNOSIS — M542 Cervicalgia: Secondary | ICD-10-CM

## 2021-06-18 DIAGNOSIS — E785 Hyperlipidemia, unspecified: Secondary | ICD-10-CM

## 2021-06-18 DIAGNOSIS — I6523 Occlusion and stenosis of bilateral carotid arteries: Secondary | ICD-10-CM | POA: Diagnosis not present

## 2021-06-18 DIAGNOSIS — I10 Essential (primary) hypertension: Secondary | ICD-10-CM | POA: Diagnosis not present

## 2021-06-18 DIAGNOSIS — E041 Nontoxic single thyroid nodule: Secondary | ICD-10-CM | POA: Diagnosis not present

## 2021-06-24 ENCOUNTER — Telehealth: Payer: Self-pay | Admitting: *Deleted

## 2021-06-24 NOTE — Telephone Encounter (Signed)
Pt lvm wanting to know what the CKD stage 3 means.   Also she would like to be switched back to the HCTZ capsules she stated that the pills cause her to feel "out of sorts"

## 2021-06-25 MED ORDER — HYDROCHLOROTHIAZIDE 12.5 MG PO CAPS: 12.5000 mg | ORAL_CAPSULE | Freq: Every day | ORAL | 1 refills | Status: DC

## 2021-06-25 NOTE — Telephone Encounter (Signed)
Okay to switch back to capsule.  Please just Clarify if she is actually taking 25 mg or 12.5 mg.  On the record it looks like she is splitting the 25's but I just want to make sure.  I would love for her to take a full 25 mg because I think it would really help her blood pressure.  We can discuss CKD 3 at her next office visit I feel like we have already gone over this before.  It means she has mild impairment to her kidneys but again we can discuss in detail when I see her back

## 2021-06-25 NOTE — Telephone Encounter (Signed)
Meds ordered this encounter  Medications   hydrochlorothiazide (MICROZIDE) 12.5 MG capsule    Sig: Take 1 capsule (12.5 mg total) by mouth daily.    Dispense:  90 capsule    Refill:  1

## 2021-06-25 NOTE — Addendum Note (Signed)
Addended by: Beatrice Lecher D on: 06/25/2021 11:36 AM   Modules accepted: Orders

## 2021-06-25 NOTE — Telephone Encounter (Signed)
Spoke w/pt she is taking 1/2 tab of 25 mg hctz. She does not want to increase the dosage.   Also informed her of CKD indications and that this can be discussed at her next appt.

## 2021-07-26 ENCOUNTER — Other Ambulatory Visit: Payer: Self-pay

## 2021-07-26 ENCOUNTER — Ambulatory Visit (INDEPENDENT_AMBULATORY_CARE_PROVIDER_SITE_OTHER): Payer: Medicare HMO | Admitting: Medical-Surgical

## 2021-07-26 ENCOUNTER — Encounter: Payer: Self-pay | Admitting: Medical-Surgical

## 2021-07-26 VITALS — BP 197/100 | HR 53 | Resp 20 | Wt 162.2 lb

## 2021-07-26 DIAGNOSIS — K1120 Sialoadenitis, unspecified: Secondary | ICD-10-CM | POA: Diagnosis not present

## 2021-07-26 DIAGNOSIS — I10 Essential (primary) hypertension: Secondary | ICD-10-CM

## 2021-07-26 MED ORDER — AZITHROMYCIN 250 MG PO TABS: ORAL_TABLET | ORAL | 0 refills | Status: AC

## 2021-07-26 NOTE — Progress Notes (Signed)
  HPI with pertinent ROS:   CC: facial swelling, hearing issue  HPI: Pleasant 80 year old female presenting today for evaluation of facial swelling near her ear and hearing difficulty on that side.  Her symptoms started last Friday after she was outside working in the yard and she originally thought she may have been bitten by something.  She did not notice any specific bite or stinging pain that would be associated with a bug bite.  She initially had some discomfort around the swelling but is no longer having any tenderness or pain.  She feels that her vision is a little bit blurrier than usual on her left side.  Her hearing on the left side has been somewhat decreased.  She did use eardrops but this was not helpful.  Has not tried any other over-the-counter treatments or home remedies.  Denies fever, chills, shortness of breath, chest pain, difficulty swallowing, tinnitus, cervical lymphadenopathy, sore throat, and other upper respiratory symptoms.  I reviewed the past medical history, family history, social history, surgical history, and allergies today and no changes were needed.  Please see the problem list section below in epic for further details.   Physical exam:   General: Well Developed, well nourished, and in no acute distress.  Neuro: Alert and oriented x3.  HEENT: Normocephalic, atraumatic, pupils equal round reactive to light, neck supple, + left preauricular swelling, no lymphadenopathy, thyroid nonpalpable.  Unable to visualize left TM secondary to cerumen buildup.  Left ear canal somewhat swollen limiting further exam.  No tenderness to palpation of the preauricular swelling. Skin: Warm and dry. Cardiac: Regular rate and rhythm, no murmurs rubs or gallops, no lower extremity edema.  Respiratory: Clear to auscultation bilaterally. Not using accessory muscles, speaking in full sentences.  Impression and Recommendations:    1. Parotiditis Suspect swelling is related to parotid  gland infection. Treating with Azithromycin due to PCN allergy. Recommend warm compresses for 10-15 minutes at least 4-5 times daily followed by massage. Also can try sour candies to promote saliva production.   Return if symptoms worsen or fail to improve. ___________________________________________ Clearnce Sorrel, DNP, APRN, FNP-BC [facility]

## 2021-08-04 ENCOUNTER — Encounter: Payer: Self-pay | Admitting: Family Medicine

## 2021-08-04 ENCOUNTER — Other Ambulatory Visit: Payer: Self-pay

## 2021-08-04 ENCOUNTER — Ambulatory Visit (INDEPENDENT_AMBULATORY_CARE_PROVIDER_SITE_OTHER): Payer: Medicare HMO | Admitting: Family Medicine

## 2021-08-04 VITALS — BP 172/56 | HR 59 | Ht 64.0 in | Wt 162.0 lb

## 2021-08-04 DIAGNOSIS — H6122 Impacted cerumen, left ear: Secondary | ICD-10-CM | POA: Diagnosis not present

## 2021-08-04 DIAGNOSIS — R220 Localized swelling, mass and lump, head: Secondary | ICD-10-CM

## 2021-08-04 DIAGNOSIS — H9192 Unspecified hearing loss, left ear: Secondary | ICD-10-CM | POA: Diagnosis not present

## 2021-08-04 DIAGNOSIS — R351 Nocturia: Secondary | ICD-10-CM | POA: Diagnosis not present

## 2021-08-04 DIAGNOSIS — Z23 Encounter for immunization: Secondary | ICD-10-CM | POA: Diagnosis not present

## 2021-08-04 MED ORDER — OXYBUTYNIN CHLORIDE ER 5 MG PO TB24: 5.0000 mg | ORAL_TABLET | Freq: Every day | ORAL | 1 refills | Status: DC

## 2021-08-04 NOTE — Assessment & Plan Note (Addendum)
Curious significantly impacting her sleep quality.  [name] options.  We will do a trial of oxybutynin warned about potential side effects.  If helpful we will continue make sure to take the HCTZ first thing in the morning.  Normally I would discontinue the diuretic but she has had significant side effects with multiple antihypertensives that at this point I think we need to keep her on that current regimen.

## 2021-08-04 NOTE — Patient Instructions (Signed)
Plesae schedule your medicare wellness exam  on the way out today, with [place].

## 2021-08-04 NOTE — Progress Notes (Signed)
Established Patient Office Visit  Subjective:  Patient ID: Tina Medina, female    DOB: 06/24/1941  Age: 80 y.o. MRN: [id number]  CC:  Chief Complaint  Patient presents with   Facial Swelling    HPI Tina Medina [facility] presents for follow-up of left-sided facial swelling in front of her ear and jaw.  She was seen on August 22 by one of my partners.  She felt like she had sudden facial swelling and hearing difficulty on that left side.  She had been working on her yard and thought maybe something had bitten her that had caused the swelling.  She did use some eardrops that she had at home but said it did not really help.  She was seen by one of our providers who felt like it was possibly parotid Titus and recommended treatment with azithromycin warm compresses and lemon candy.  She says she completed the antibiotic on Saturday about 4 days ago and feels like the swelling is probably more than 50% improved she feels like it is gone down significantly.  She was using lemons and lemon water.  She really has not had any significant discomfort or pain.  She still feels like she is not hearing well in that left ear.  But she did have a cerumen impaction on exam  He also wanted to discuss her struggles with urinating at night.  She says that she has been trying to increase her fluids during the day but cuts her self off around 6 PM right after her evening meal.  But she still waking up 2-3 times at night to urinate.  Unfortunately when she is awake it takes her almost 2 hours to fall back asleep so its been extremely frustrating.  She does have some occasional urgency as well.  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   8 mm in the R tentorium cerebelli   Menopause    since age 44   Migraine     Osteoma    2 mm of the L lid ethmoid air cells   Thyroid nodule     Past Surgical History:  Procedure Laterality Date   BREAST BIOPSY Left    BREAST EXCISIONAL BIOPSY Left    BREAST SURGERY  1969   Left    Family History  Problem Relation Age of Onset   Hyperlipidemia Mother    Hypertension Mother    Diabetes Mother    Prostate cancer Father    Hypertension Brother     Social History   Socioeconomic History   Marital status: Widowed    Spouse name: Not on file   Number of children: 3   Years of education: 24   Highest education level: Some college, no degree  Occupational History   Occupation: Retired.      Employer: [employer]    Comment: Retired  Tobacco Use   Smoking status: Former    Types: Cigarettes   Smokeless tobacco: Former    Quit date: 12/05/1962  Vaping Use   Vaping Use: Never used  Substance and Sexual Activity   Alcohol use: Yes    Comment: Occasional glass of wine   Drug use: No   Sexual activity: Not Currently  Other  Topics Concern   Not on file  Social History Narrative   Not on file   Social Determinants of Health   Financial Resource Strain: Not on file  Food Insecurity: Not on file  Transportation Needs: Not on file  Physical Activity: Not on file  Stress: Not on file  Social Connections: Not on file  Intimate Partner Violence: Not on file    Outpatient Medications Prior to Visit  Medication Sig Dispense Refill   aspirin 81 MG chewable tablet Chew 81 mg by mouth daily.     hydrochlorothiazide (MICROZIDE) 12.5 MG capsule Take 1 capsule (12.5 mg total) by mouth daily. 90 capsule 1   Nebivolol HCl 20 MG TABS Take 1 tablet (20 mg total) by mouth 2 (two) times daily. 180 tablet 1   rosuvastatin (CRESTOR) 5 MG tablet Take 1 tablet (5 mg total) by mouth at bedtime. 30 tablet 11   No facility-administered medications prior to visit.    Allergies  Allergen Reactions   Ace Inhibitors     REACTION: cough Other reaction(s): Abdominal Pain,  Chest Pain Patient develops cough, headaches   Atenolol    Benicar [Olmesartan Medoxomil] Other (See Comments)    Chest pain   Carvedilol Diarrhea    Complains of diarrhea and chest pain.    Clonidine Derivatives Other (See Comments)   Diltiazem Other (See Comments)    Sharp head ache.    Hydralazine Other (See Comments)    "Chills"    Losartan Other (See Comments)    headache   Metoprolol Diarrhea    Abdominal cramping.    Micardis [Telmisartan] Other (See Comments)    Chest Pain, Back Pain.    Penicillins     REACTION: rash   Verapamil Other (See Comments)    Headaches and constipation   Amlodipine Rash    And Headaches    ROS Review of Systems    Objective:    Physical Exam Vitals reviewed.  Constitutional:      Appearance: She is well-developed.  HENT:     Head: Normocephalic and atraumatic.     Comments: She still has noticeable swelling on the left side of the face in front of the ear near the TMJ joint and going down into her jaw.  It is slightly tender on exam I do not palpate any discrete lymphadenopathy.  Left TM is blocked by cerumen.    Right Ear: Ear canal normal.  Eyes:     Conjunctiva/sclera: Conjunctivae normal.  Cardiovascular:     Rate and Rhythm: Normal rate.  Pulmonary:     Effort: Pulmonary effort is normal.  Skin:    General: Skin is dry.     Coloration: Skin is not pale.  Neurological:     Mental Status: She is alert and oriented to person, place, and time.  Psychiatric:        Behavior: Behavior normal.    BP (!) 172/56   Pulse (!) 59   Ht '5\' 4"'$  (1.626 m)   Wt 162 lb (73.5 kg)   SpO2 100%   BMI 27.81 kg/m  Wt Readings from Last 3 Encounters:  08/04/21 162 lb (73.5 kg)  07/26/21 162 lb 3.2 oz (73.6 kg)  06/10/21 162 lb (73.5 kg)     Health Maintenance Due  Topic Date Due   Zoster Vaccines- Shingrix (1 of 2) Never done   TETANUS/TDAP  12/05/2014   COVID-19 Vaccine (4 - Booster for Moderna series) 01/29/2021   INFLUENZA  VACCINE  07/05/2021    There are no preventive care reminders to display for this patient.  Lab Results  Component Value Date   TSH 1.45 10/09/2019   Lab Results  Component Value Date   WBC 7.6 11/24/2020   HGB 14.7 11/24/2020   HCT 42.5 11/24/2020   MCV 91.8 11/24/2020   PLT 327 11/24/2020   Lab Results  Component Value Date   NA 142 06/10/2021   K 4.7 06/10/2021   CO2 30 06/10/2021   GLUCOSE 81 06/10/2021   BUN 12 06/10/2021   CREATININE 1.21 (H) 06/10/2021   BILITOT 0.4 11/24/2020   ALKPHOS 66 01/30/2015   AST 20 11/24/2020   ALT 10 11/24/2020   PROT 7.4 11/24/2020   ALBUMIN 4.1 01/30/2015   CALCIUM 10.0 06/10/2021   Lab Results  Component Value Date   CHOL 200 (H) 09/11/2020   Lab Results  Component Value Date   HDL 42 (L) 09/11/2020   Lab Results  Component Value Date   LDLCALC 135 (H) 09/11/2020   Lab Results  Component Value Date   TRIG 115 09/11/2020   Lab Results  Component Value Date   CHOLHDL 4.8 09/11/2020   Lab Results  Component Value Date   HGBA1C 5.4 09/03/2018      Assessment & Plan:   Problem List Items Addressed This Visit       Other   Nocturia    Curious significantly impacting her sleep quality.  [name] options.  We will do a trial of oxybutynin warned about potential side effects.  If helpful we will continue make sure to take the HCTZ first thing in the morning.  Normally I would discontinue the diuretic but she has had significant side effects with multiple antihypertensives that at this point I think we need to keep her on that current regimen.      Other Visit Diagnoses     Facial swelling    -  Primary   Hearing decreased, left       Relevant Orders   Ambulatory referral to ENT   Impacted cerumen of left ear       Relevant Orders   Ambulatory referral to ENT      Hearing loss in the left ear/cerumen impaction-recommend removal.  Patient preferred to see ENT she has had to do this in the past.   Hopefully this will improve the hearing loss.  Also just 1 to make sure that her eardrum looks good on exam.  Facial swelling has improved significantly likely parotiditis.  She had a great response to the antibiotic.  We will continue to monitor over the weekend if not continue to improve then please let us know.  Meds ordered this encounter  Medications   oxybutynin (DITROPAN XL) 5 MG 24 hr tablet    Sig: Take 1 tablet (5 mg total) by mouth at bedtime.    Dispense:  30 tablet    Refill:  1     Follow-up: Return in about 2 months (around 10/04/2021) for New start medication.    Beatrice Lecher, MD

## 2021-08-13 ENCOUNTER — Ambulatory Visit (INDEPENDENT_AMBULATORY_CARE_PROVIDER_SITE_OTHER): Payer: Medicare HMO | Admitting: Family Medicine

## 2021-08-13 ENCOUNTER — Other Ambulatory Visit: Payer: Self-pay

## 2021-08-13 VITALS — BP 172/90 | HR 53 | Ht 64.0 in | Wt 162.0 lb

## 2021-08-13 DIAGNOSIS — Z780 Asymptomatic menopausal state: Secondary | ICD-10-CM | POA: Diagnosis not present

## 2021-08-13 DIAGNOSIS — Z0000 Encounter for general adult medical examination without abnormal findings: Secondary | ICD-10-CM | POA: Diagnosis not present

## 2021-08-13 NOTE — Patient Instructions (Addendum)
Tina Medina Maintenance Summary and Written Plan of Care  Tina Medina ,  Thank you for allowing me to perform your Medicare Annual Wellness Visit and for your ongoing commitment to your health.   Health Maintenance & Immunization History Health Maintenance  Topic Date Due   COVID-19 Vaccine (4 - Booster for Moderna series) 08/29/2021 (Originally 01/29/2021)   Zoster Vaccines- Shingrix (1 of 2) 11/12/2021 (Originally 01/10/1989)   TETANUS/TDAP  08/13/2022 (Originally 12/05/2014)   INFLUENZA VACCINE  Completed   DEXA SCAN  Completed   PNA vac Low Risk Adult  Completed   HPV VACCINES  Aged Out   Immunization History  Administered Date(s) Administered   Fluad Quad(high Dose 65+) 08/07/2019, 09/10/2020, 08/04/2021   Influenza Split 08/20/2012   Influenza Whole 10/16/2009, 08/12/2011   Influenza, High Dose Seasonal PF 08/30/2018   Influenza,inj,Quad PF,6+ Mos 09/30/2013, 07/28/2014   Moderna Sars-Covid-2 Vaccination 02/06/2020, 03/05/2020, 09/28/2020   Pneumococcal Conjugate-13 05/12/2015   Pneumococcal Polysaccharide-23 02/26/2008   Td 12/05/2004   Zoster, Live 11/05/2013    These are the patient goals that we discussed:  Goals Addressed               This Visit's Progress     Patient Stated (pt-stated)        08/13/2021 AWV Goal: Exercise for General Health  Patient will verbalize understanding of the benefits of increased physical activity: Exercising regularly is important. It will improve your overall fitness, flexibility, and endurance. Regular exercise also will improve your overall health. It can help you control your weight, reduce stress, and improve your bone density. Over the next year, patient will increase physical activity as tolerated with a goal of at least 150 minutes of moderate physical activity per week.  You can tell that you are exercising at a moderate intensity if your heart starts beating faster and you start breathing faster but  can still hold a conversation. Moderate-intensity exercise ideas include: Walking 1 mile (1.6 km) in about 15 minutes Biking Hiking Golfing Dancing Water aerobics Patient will verbalize understanding of everyday activities that increase physical activity by providing examples like the following: Yard work, such as: Sales promotion account executive Gardening Washing windows or floors Patient will be able to explain general safety guidelines for exercising:  Before you start a new exercise program, talk with your health care provider. Do not exercise so much that you hurt yourself, feel dizzy, or get very short of breath. Wear comfortable clothes and wear shoes with good support. Drink plenty of water while you exercise to prevent dehydration or heat stroke. Work out until your breathing and your heartbeat get faster.          This is a list of Health Maintenance Items that are overdue or due now: Td vaccine Bone densitometry screening Shingles vaccine  Patient declined the vaccines at this time.    Orders/Referrals Placed Today: Orders Placed This Encounter  Procedures   DEXAScan    Standing Status:   Future    Standing Expiration Date:   08/13/2022    Order Specific Question:   Reason for exam:    Answer:   Post Menopsaul    Order Specific Question:   Preferred imaging location?    Answer:   [facility]   (Contact our referral department at [phone] if you have not spoken with someone about your referral appointment within the next 5 days)  Follow-up Plan Follow-up with Hali Marry, MD as planned Bone density referral has been sent and they will call you to schedule. Medicare wellness visit in one year.  AVS printed and given.

## 2021-08-13 NOTE — Progress Notes (Signed)
MEDICARE ANNUAL WELLNESS VISIT  08/13/2021  Subjective:  Tina Medina is a 80 y.o. female patient of Metheney, Rene Kocher, MD who had a Medicare Annual Wellness Visit today. Tina Medina is Retired and lives alone. Tina Medina ha 3 children, one has deceased. Tina Medina reports that Tina Medina is socially active and does interact with friends/family regularly. Tina Medina is minimally physically active and enjoys decorating.  Patient Care Team: Hali Marry, MD as PCP - General (Family Medicine)  Advanced Directives 08/13/2021 07/01/2019 12/19/2014  Does Patient Have a Medical Advance Directive? Yes Yes Yes  Type of Advance Directive Living will;Healthcare Power of [place];Living will [place]  Does patient want to make changes to medical advance directive? No - Patient declined No - Patient declined No - Patient declined  Copy of [place] in Chart? No - copy requested No - copy requested Yes    Hospital Utilization Over the Past 12 Months: # of hospitalizations or ER visits: 0 # of surgeries: 0  Review of Systems    Patient reports that her overall health is worse when compared to last year.  Review of Systems: History obtained from chart review and the patient  All other systems negative.  Pain Assessment Pain : No/denies pain     Current Medications & Allergies (verified) Allergies as of 08/13/2021       Reactions   Ace Inhibitors    REACTION: cough Other reaction(s): Abdominal Pain, Chest Pain Patient develops cough, headaches   Atenolol    Benicar [olmesartan Medoxomil] Other (See Comments)   Chest pain   Carvedilol Diarrhea   Complains of diarrhea and chest pain.    Clonidine Derivatives Other (See Comments)   Diltiazem Other (See Comments)   Sharp head ache.    Hydralazine Other (See Comments)   "Chills"    Losartan Other (See Comments)   headache   Metoprolol Diarrhea   Abdominal cramping.    Micardis  [telmisartan] Other (See Comments)   Chest Pain, Back Pain.    Penicillins    REACTION: rash   Verapamil Other (See Comments)   Headaches and constipation   Amlodipine Rash   And Headaches        Medication List        Accurate as of August 13, 2021 11:10 AM. If you have any questions, ask your nurse or doctor.          aspirin 81 MG chewable tablet Chew 81 mg by mouth daily.   hydrochlorothiazide 12.5 MG capsule Commonly known as: Microzide Take 1 capsule (12.5 mg total) by mouth daily.   Nebivolol HCl 20 MG Tabs Take 1 tablet (20 mg total) by mouth 2 (two) times daily.   oxybutynin 5 MG 24 hr tablet Commonly known as: Ditropan XL Take 1 tablet (5 mg total) by mouth at bedtime.   rosuvastatin 5 MG tablet Commonly known as: CRESTOR Take 1 tablet (5 mg total) by mouth at bedtime.        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   8 mm in the R tentorium cerebelli   Menopause    since age 70  Migraine    Osteoma    2 mm of the L lid ethmoid air cells   Thyroid nodule    Past Surgical History:  Procedure Laterality Date   BREAST BIOPSY Left    BREAST EXCISIONAL BIOPSY Left    BREAST SURGERY  1969   Left   TUBAL LIGATION     Family History  Problem Relation Age of Onset   Hyperlipidemia Mother    Hypertension Mother    Diabetes Mother    Prostate cancer Father    Hypertension Brother    Social History   Socioeconomic History   Marital status: Widowed    Spouse name: Not on file   Number of children: 3   Years of education: 37   Highest education level: Some college, no degree  Occupational History   Occupation: Retired.      Employer: [employer]    Comment: Retired   Tobacco Use   Smoking status: Former    Packs/day: 0.00    Types: Cigarettes    Quit date: 12/05/1968    Years since quitting: 52.7   Smokeless tobacco: Former    Quit date: 12/05/1962   Tobacco comments:    light smoker  Vaping Use   Vaping Use: Never used  Substance and Sexual Activity   Alcohol use: Not Currently    Comment: Occasional glass of wine   Drug use: No   Sexual activity: Not Currently  Other Topics Concern   Not on file  Social History Narrative   Lives alone. Tina Medina had three children, two living. Her daughters live in [place]. Tina Medina enjoys decorating in her free time. Tina Medina does stretching exercises for ten mins everyday.   Social Determinants of Health   Financial Resource Strain: Low Risk    Difficulty of Paying Living Expenses: Not hard at all  Food Insecurity: No Food Insecurity   Worried About Charity fundraiser in the Last Year: Never true   Tina Medina in the Last Year: Never true  Transportation Needs: No Transportation Needs   Lack of Transportation (Medical): No   Lack of Transportation (Non-Medical): No  Physical Activity: Inactive   Days of Exercise per Week: 0 days   Minutes of Exercise per Session: 0 min  Stress: No Stress Concern Present   Feeling of Stress : Not at all  Social Connections: Moderately Integrated   Frequency of Communication with Friends and Family: More than three times a week   Frequency of Social Gatherings with Friends and Family: More than three times a week   Attends Religious Services: More than 4 times per year   Active Member of [employer] or Organizations: Yes   Attends Archivist Meetings: More than 4 times per year   Marital Status: Widowed    Activities of Daily Living In your present state of health, do you have any difficulty performing the following activities: 08/13/2021  Hearing? N  Vision? Y  Comment still has some difficulty with her vision. Tina Medina has had new glasses.  Difficulty concentrating or  making decisions? Y  Comment some difficulty or memory.  Walking or climbing stairs? Y  Comment has some stiffness in her knees.  Dressing or bathing? Y  Comment has a handrail in the bathroom that Tina Medina uses to get out of the tub.  Doing errands, shopping? N  Preparing Food and eating ? N  Using the Toilet? N  In the past six months, have you accidently leaked urine? Tina Medina  Comment Tina Medina is on medication which causes her to void frequently.  Do you have problems with loss of bowel control? N  Managing your Medications? N  Managing your Finances? N  Housekeeping or managing your Housekeeping? N  Some recent data might be hidden    Patient Education/Literacy How often do you need to have someone help you when you read instructions, pamphlets, or other written materials from your doctor or pharmacy?: 1 - Never What is the last grade level you completed in school?: Some college  Exercise Current Exercise Habits: Home exercise routine, Type of exercise: stretching, Time (Minutes): 10, Frequency (Times/Week): 7, Weekly Exercise (Minutes/Week): 70, Intensity: Mild, Exercise limited by: None identified  Diet Patient reports consuming 3 meals a day and 1 snack(s) a day Patient reports that her primary diet is: Regular Patient reports that Tina Medina does have regular access to food.   Depression Screen PHQ 2/9 Scores 08/13/2021 06/10/2021 04/13/2021 02/08/2021 06/19/2020 05/13/2020 03/05/2020  PHQ - 2 Score 0 0 3 0 1 2 0  PHQ- 9 Score - 0 14 - 3 7 -     Fall Risk Fall Risk  08/13/2021 06/10/2021 03/05/2020 07/01/2019 01/27/2014  Falls in the past year? 0 0 0 0 No  Number falls in past yr: 0 0 0 - -  Injury with Fall? 1 0 0 0 -  Risk for fall due to : No Fall Risks No Fall Risks No Fall Risks - -  Follow up Falls evaluation completed Falls prevention discussed;Falls evaluation completed - Falls prevention discussed -     Objective:   BP (!) 172/90 (BP Location: Left Arm, Patient Position: Sitting, Cuff Size:  Normal) Comment: Manual  Pulse (!) 53   Ht '5\' 4"'$  (1.626 m)   Wt 162 lb 0.6 oz (73.5 kg)   SpO2 100%   BMI 27.81 kg/m   Last Weight  Most recent update: 08/13/2021 10:06 AM    Weight  73.5 kg (162 lb 0.6 oz)             Body mass index is 27.81 kg/m.  Hearing/Vision  Tina Medina did not have difficulty with hearing/understanding during the face-to-face interview Tina Medina did not have difficulty with her vision during the face-to-face interview Reports that Tina Medina has had a formal eye exam by an eye care professional within the past year Reports that Tina Medina has not had a formal hearing evaluation within the past year  Cognitive Function: 6CIT Screen 08/13/2021 07/01/2019  What Year? 0 points 0 points  What month? 0 points 0 points  What time? 0 points 0 points  Count back from 20 0 points 0 points  Months in reverse 0 points 0 points  Repeat phrase 0 points 4 points  Total Score 0 4    Normal Cognitive Function Screening: Yes (Normal:0-7, Significant for Dysfunction: >8)  Immunization & Health Maintenance Record Immunization History  Administered Date(s) Administered   Fluad Quad(high Dose 65+) 08/07/2019, 09/10/2020, 08/04/2021   Influenza Split 08/20/2012   Influenza Whole 10/16/2009, 08/12/2011   Influenza, High Dose Seasonal PF 08/30/2018   Influenza,inj,Quad PF,6+ Mos 09/30/2013, 07/28/2014   Moderna Sars-Covid-2 Vaccination 02/06/2020, 03/05/2020, 09/28/2020   Pneumococcal Conjugate-13 05/12/2015   Pneumococcal Polysaccharide-23 02/26/2008   Td 12/05/2004   Zoster, Live 11/05/2013    Health Maintenance  Topic Date Due   COVID-19 Vaccine (4 - Booster for Moderna series) 08/29/2021 (Originally 01/29/2021)   Zoster Vaccines- Shingrix (1 of 2) 11/12/2021 (Originally 01/10/1989)   TETANUS/TDAP  08/13/2022 (Originally  12/05/2014)   INFLUENZA VACCINE  Completed   DEXA SCAN  Completed   PNA vac Low Risk Adult  Completed   HPV VACCINES  Aged Out       Assessment  This is a  routine wellness examination for Tina Medina.  Health Maintenance: Due or Overdue There are no preventive care reminders to display for this patient.   Tina Medina does not need a referral for Community Assistance: Care Management:   no Social Work:    no Prescription Assistance:  no Nutrition/Diabetes Education:  no   Plan:  Personalized Goals  Goals Addressed               This Visit's Progress     Patient Stated (pt-stated)        08/13/2021 AWV Goal: Exercise for General Health  Patient will verbalize understanding of the benefits of increased physical activity: Exercising regularly is important. It will improve your overall fitness, flexibility, and endurance. Regular exercise also will improve your overall health. It can help you control your weight, reduce stress, and improve your bone density. Over the next year, patient will increase physical activity as tolerated with a goal of at least 150 minutes of moderate physical activity per week.  You can tell that you are exercising at a moderate intensity if your heart starts beating faster and you start breathing faster but can still hold a conversation. Moderate-intensity exercise ideas include: Walking 1 mile (1.6 km) in about 15 minutes Biking Hiking Golfing Dancing Water aerobics Patient will verbalize understanding of everyday activities that increase physical activity by providing examples like the following: Yard work, such as: Sales promotion account executive Gardening Washing windows or floors Patient will be able to explain general safety guidelines for exercising:  Before you start a new exercise program, talk with your health care provider. Do not exercise so much that you hurt yourself, feel dizzy, or get very short of breath. Wear comfortable clothes and wear shoes with good support. Drink plenty of water while you exercise to  prevent dehydration or heat stroke. Work out until your breathing and your heartbeat get faster.        Personalized Health Maintenance & Screening Recommendations  Td vaccine Bone densitometry screening Shingles vaccine  Patient declined the vaccines at this time.  Lung Cancer Screening Recommended: no (Low Dose CT Chest recommended if Age 24-80 years, 30 pack-year currently smoking OR have quit w/in past 15 years) Hepatitis C Screening recommended: no HIV Screening recommended: no  Advanced Directives: Written information was not given per the patient's request.  Referrals & Orders Orders Placed This Encounter  Procedures   [place]     Follow-up Plan Follow-up with Hali Marry, MD as planned Bone density referral has been sent and they will call you to schedule. Medicare wellness visit in one year.  AVS printed and given.   I have personally reviewed and noted the following in the patient's chart:   Medical and social history Use of alcohol, tobacco or illicit drugs  Current medications and supplements Functional ability and status Nutritional status Physical activity Advanced directives List of other physicians Hospitalizations, surgeries, and ER visits in previous 12 months Vitals Screenings to include cognitive, depression, and falls Referrals and appointments  In addition, I have reviewed and discussed with patient certain preventive protocols, quality metrics, and best practice recommendations. A written personalized care plan for preventive  services as well as general preventive health recommendations were provided to patient.     Tinnie Gens, RN  08/13/2021

## 2021-08-18 ENCOUNTER — Encounter: Payer: Self-pay | Admitting: Family Medicine

## 2021-08-23 DIAGNOSIS — R1013 Epigastric pain: Secondary | ICD-10-CM | POA: Diagnosis not present

## 2021-08-23 DIAGNOSIS — K5900 Constipation, unspecified: Secondary | ICD-10-CM | POA: Diagnosis not present

## 2021-08-24 DIAGNOSIS — H6092 Unspecified otitis externa, left ear: Secondary | ICD-10-CM | POA: Diagnosis not present

## 2021-08-24 DIAGNOSIS — H6122 Impacted cerumen, left ear: Secondary | ICD-10-CM | POA: Diagnosis not present

## 2021-09-01 ENCOUNTER — Other Ambulatory Visit: Payer: Self-pay

## 2021-09-01 ENCOUNTER — Ambulatory Visit (INDEPENDENT_AMBULATORY_CARE_PROVIDER_SITE_OTHER): Payer: Medicare HMO

## 2021-09-01 DIAGNOSIS — Z780 Asymptomatic menopausal state: Secondary | ICD-10-CM

## 2021-09-01 DIAGNOSIS — Z0000 Encounter for general adult medical examination without abnormal findings: Secondary | ICD-10-CM

## 2021-09-01 DIAGNOSIS — M85852 Other specified disorders of bone density and structure, left thigh: Secondary | ICD-10-CM | POA: Diagnosis not present

## 2021-09-03 ENCOUNTER — Encounter: Payer: Self-pay | Admitting: Family Medicine

## 2021-09-03 DIAGNOSIS — M8580 Other specified disorders of bone density and structure, unspecified site: Secondary | ICD-10-CM | POA: Insufficient documentation

## 2021-09-03 NOTE — Progress Notes (Signed)
Tina Medina, your bone density test shows osteopenia.  T score is -1.6.   The current recommendation for osteopenia (mildly thin bones) treatment includes:   #1 calcium-total of 1200 mg of calcium daily.  If you eat a very calcium rich diet you may be able to obtain that without a supplement.  If not, then I recommend calcium 500 mg twice a day.  There are several products over-the-counter such as Caltrate D and Viactiv chews which are great options that contain calcium and vitamin D. #2 vitamin D-recommend 800 international units daily. #3 exercise-recommend 30 minutes of weightbearing exercise 3 days a week.  Resistance training ,such as doing bands and light weights, can be particularly helpful.

## 2021-09-23 DIAGNOSIS — H52223 Regular astigmatism, bilateral: Secondary | ICD-10-CM | POA: Diagnosis not present

## 2021-09-23 DIAGNOSIS — H26493 Other secondary cataract, bilateral: Secondary | ICD-10-CM | POA: Diagnosis not present

## 2021-10-05 ENCOUNTER — Encounter: Payer: Self-pay | Admitting: Family Medicine

## 2021-10-05 ENCOUNTER — Ambulatory Visit (INDEPENDENT_AMBULATORY_CARE_PROVIDER_SITE_OTHER): Payer: Medicare HMO | Admitting: Family Medicine

## 2021-10-05 ENCOUNTER — Other Ambulatory Visit: Payer: Self-pay

## 2021-10-05 VITALS — BP 190/74 | HR 54 | Temp 98.2°F | Ht 64.0 in | Wt 162.0 lb

## 2021-10-05 DIAGNOSIS — R351 Nocturia: Secondary | ICD-10-CM | POA: Diagnosis not present

## 2021-10-05 DIAGNOSIS — D320 Benign neoplasm of cerebral meninges: Secondary | ICD-10-CM

## 2021-10-05 DIAGNOSIS — E7800 Pure hypercholesterolemia, unspecified: Secondary | ICD-10-CM | POA: Diagnosis not present

## 2021-10-05 DIAGNOSIS — N1831 Chronic kidney disease, stage 3a: Secondary | ICD-10-CM | POA: Diagnosis not present

## 2021-10-05 NOTE — Progress Notes (Signed)
Established Patient Office Visit  Subjective:  Patient ID: Tina Medina, female    DOB: 20-Dec-1940  Age: 80 y.o. MRN: [id number]  CC:  Chief Complaint  Patient presents with   Nocturia    Pt never started oxybutynin 5 mg that was prescribed for her at her last OV    HPI Tina Medina presents for 65-month follow-up for new start medication.  Evidently she never picked up the oxybutynin 5 mg that was prescribed.  She did see Dr. Doran Stabler, gastroenterology for her epigastric pain and constipation since I last saw her.  So referred her to ENT for left decreased hearing.  They treated her for impacted cerumen on the left ear.  He has had some changes with her vision lately she feels like it is gotten worse and so she went back to the eye doctor and they recommended a new prescription.  This will be the third time this year that her prescription has changed.  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   8 mm in the R tentorium cerebelli   Menopause    since age 37   Migraine    Osteoma    2 mm of the L lid ethmoid air cells   Thyroid nodule     Past Surgical History:  Procedure Laterality Date   BREAST BIOPSY Left    BREAST EXCISIONAL BIOPSY Left    BREAST SURGERY  1969   Left   TUBAL LIGATION      Family History  Problem Relation Age of Onset   Hyperlipidemia Mother    Hypertension Mother    Diabetes Mother    Prostate cancer Father    Hypertension Brother     Social History   Socioeconomic History   Marital status: Widowed    Spouse name: Not on file   Number of children: 3   Years of education: 87   Highest education level: Some college, no degree  Occupational History   Occupation: Retired.       Employer: [employer]    Comment: Retired  Tobacco Use   Smoking status: Former    Packs/day: 0.00    Types: Cigarettes    Quit date: 12/05/1968    Years since quitting: 52.8   Smokeless tobacco: Former    Quit date: 12/05/1962   Tobacco comments:    light smoker  Vaping Use   Vaping Use: Never used  Substance and Sexual Activity   Alcohol use: Not Currently    Comment: Occasional glass of wine   Drug use: No   Sexual activity: Not Currently  Other Topics Concern   Not on file  Social History Narrative   Lives alone. She had three children, two living. Her daughters live in [place]. She enjoys decorating in her free time. She does stretching exercises for ten mins everyday.   Social Determinants of Health   Financial Resource Strain: Low Risk    Difficulty of Paying Living Expenses: Not hard at all  Food Insecurity: No Food Insecurity   Worried About Charity fundraiser in the Last Year: Never true   Ran Out of Food in the Last Year: Never true  Transportation Needs: No Data processing manager (Medical): No   Lack of Transportation (Non-Medical): No  Physical Activity: Inactive   Days of Exercise per Week: 0 days   Minutes of Exercise per Session: 0 min  Stress: No Stress Concern Present   Feeling of Stress : Not at all  Social Connections: Moderately Integrated   Frequency of Communication with Friends and Family: More than three times a week   Frequency of Social Gatherings with Friends and Family: More than three times a week   Attends Religious Services: More than 4 times per year   Active Member of [employer] or Organizations: Yes   Attends Archivist Meetings: More than 4 times per year   Marital Status: Widowed  Human resources officer Violence: Not At Risk   Fear of Current or Ex-Partner: No   Emotionally Abused: No   Physically Abused: No   Sexually Abused: No    Outpatient Medications Prior to Visit  Medication Sig Dispense Refill   aspirin  81 MG chewable tablet Chew 81 mg by mouth daily.     hydrochlorothiazide (MICROZIDE) 12.5 MG capsule Take 1 capsule (12.5 mg total) by mouth daily. 90 capsule 1   Nebivolol HCl 20 MG TABS Take 1 tablet (20 mg total) by mouth 2 (two) times daily. 180 tablet 1   rosuvastatin (CRESTOR) 5 MG tablet Take 1 tablet (5 mg total) by mouth at bedtime. 30 tablet 11   oxybutynin (DITROPAN XL) 5 MG 24 hr tablet Take 1 tablet (5 mg total) by mouth at bedtime. 30 tablet 1   No facility-administered medications prior to visit.    Allergies  Allergen Reactions   Ace Inhibitors     REACTION: cough Other reaction(s): Abdominal Pain, Chest Pain Patient develops cough, headaches   Atenolol    Benicar [Olmesartan Medoxomil] Other (See Comments)    Chest pain   Carvedilol Diarrhea    Complains of diarrhea and chest pain.    Clonidine Derivatives Other (See Comments)   Diltiazem Other (See Comments)    Sharp head ache.    Hydralazine Other (See Comments)    "Chills"    Losartan Other (See Comments)    headache   Metoprolol Diarrhea    Abdominal cramping.    Micardis [Telmisartan] Other (See Comments)    Chest Pain, Back Pain.    Penicillins     REACTION: rash   Verapamil Other (See Comments)    Headaches and constipation   Amlodipine Rash    And Headaches    ROS Review of Systems    Objective:    Physical Exam Constitutional:      Appearance: Normal appearance. She is well-developed.  HENT:     Head: Normocephalic and atraumatic.  Cardiovascular:     Rate and Rhythm: Normal rate and regular rhythm.     Heart sounds: Normal heart sounds.  Pulmonary:     Effort: Pulmonary effort is normal.     Breath sounds: Normal breath sounds.  Skin:    General: Skin is warm and dry.  Neurological:     Mental Status: She is alert and oriented to person, place, and time.  Psychiatric:        Behavior: Behavior normal.    BP (!) 190/74   Pulse (!) 54   Temp 98.2 F (36.8 C)   Ht 5\' 4"   (1.626 m)   Wt 162 lb (73.5 kg)   SpO2 100%   BMI 27.81 kg/m  Wt Readings from Last 3 Encounters:  10/05/21 162 lb (73.5 kg)  08/13/21 162 lb 0.6 oz (73.5 kg)  08/04/21 162 lb (73.5 kg)     There are no preventive care reminders to display for this patient.  There are no preventive care reminders to display for this patient.  Lab Results  Component Value Date   TSH 1.45 10/09/2019   Lab Results  Component Value Date   WBC 7.6 11/24/2020   HGB 14.7 11/24/2020   HCT 42.5 11/24/2020   MCV 91.8 11/24/2020   PLT 327 11/24/2020   Lab Results  Component Value Date   NA 142 06/10/2021   K 4.7 06/10/2021   CO2 30 06/10/2021   GLUCOSE 81 06/10/2021   BUN 12 06/10/2021   CREATININE 1.21 (H) 06/10/2021   BILITOT 0.4 11/24/2020   ALKPHOS 66 01/30/2015   AST 20 11/24/2020   ALT 10 11/24/2020   PROT 7.4 11/24/2020   ALBUMIN 4.1 01/30/2015   CALCIUM 10.0 06/10/2021   Lab Results  Component Value Date   CHOL 200 (H) 09/11/2020   Lab Results  Component Value Date   HDL 42 (L) 09/11/2020   Lab Results  Component Value Date   LDLCALC 135 (H) 09/11/2020   Lab Results  Component Value Date   TRIG 115 09/11/2020   Lab Results  Component Value Date   CHOLHDL 4.8 09/11/2020   Lab Results  Component Value Date   HGBA1C 5.4 09/03/2018      Assessment & Plan:   Problem List Items Addressed This Visit       Nervous and Auditory   MENINGIOMA    Stable since 2013        Genitourinary   CKD (chronic kidney disease) stage 3, GFR 30-59 ml/min (HCC)    Due to recheck renal function she would rather go ahead and get it done today while she is here.  She will be going to [place] for about 9 days.      Relevant Orders   COMPLETE METABOLIC PANEL WITH GFR   Lipid Panel w/reflex Direct LDL     Other   Nocturia - Primary    She opted not to take the medication she was just worried about it and felt like she could just continue to monitor her symptoms instead.       Hyperlipidemia    To recheck lipids.      Relevant Orders   Lipid Panel w/reflex Direct LDL    No orders of the defined types were placed in this encounter.   Follow-up: Return in about 4 months (around 02/02/2022) for Hypertension.    Beatrice Lecher, MD

## 2021-10-05 NOTE — Assessment & Plan Note (Signed)
Stable since 2013

## 2021-10-05 NOTE — Assessment & Plan Note (Signed)
To recheck lipids.

## 2021-10-05 NOTE — Assessment & Plan Note (Signed)
She opted not to take the medication she was just worried about it and felt like she could just continue to monitor her symptoms instead.

## 2021-10-05 NOTE — Assessment & Plan Note (Signed)
Due to recheck renal function she would rather go ahead and get it done today while she is here.  She will be going to [place] for about 9 days.

## 2021-10-06 LAB — COMPLETE METABOLIC PANEL WITHOUT GFR
AG Ratio: 1.6 (calc) (ref 1.0–2.5)
ALT: 13 U/L (ref 6–29)
AST: 20 U/L (ref 10–35)
Albumin: 4.2 g/dL (ref 3.6–5.1)
Alkaline phosphatase (APISO): 76 U/L (ref 37–153)
BUN/Creatinine Ratio: 13 (calc) (ref 6–22)
BUN: 13 mg/dL (ref 7–25)
CO2: 29 mmol/L (ref 20–32)
Calcium: 9.9 mg/dL (ref 8.6–10.4)
Chloride: 107 mmol/L (ref 98–110)
Creat: 1.04 mg/dL — ABNORMAL HIGH (ref 0.60–0.95)
Globulin: 2.7 g/dL (ref 1.9–3.7)
Glucose, Bld: 82 mg/dL (ref 65–99)
Potassium: 5.1 mmol/L (ref 3.5–5.3)
Sodium: 142 mmol/L (ref 135–146)
Total Bilirubin: 0.5 mg/dL (ref 0.2–1.2)
Total Protein: 6.9 g/dL (ref 6.1–8.1)
eGFR: 54 mL/min/{1.73_m2} — ABNORMAL LOW

## 2021-10-06 LAB — LIPID PANEL W/REFLEX DIRECT LDL
Cholesterol: 192 mg/dL (ref <200)
HDL: 43 mg/dL — ABNORMAL LOW (ref >=50)
LDL Cholesterol (Calc): 124 mg/dL (calc) — ABNORMAL HIGH
Non-HDL Cholesterol (Calc): 149 mg/dL (calc) — ABNORMAL HIGH (ref <130)
Total CHOL/HDL Ratio: 4.5 (calc) (ref <5.0)
Triglycerides: 142 mg/dL (ref <150)

## 2021-10-06 NOTE — Progress Notes (Signed)
Hi Irmalee, great news, kidney function is back down to where it was last year.  Had jumped up back in the summer which was a little concerning but it is actually back to 1.0 which is great.  Your LDL cholesterol still elevated at 124 with a goal of under 100.  How would you feel about bumping up the Crestor?

## 2021-12-09 ENCOUNTER — Other Ambulatory Visit: Payer: Self-pay

## 2021-12-09 ENCOUNTER — Encounter: Payer: Self-pay | Admitting: Family Medicine

## 2021-12-09 ENCOUNTER — Ambulatory Visit (INDEPENDENT_AMBULATORY_CARE_PROVIDER_SITE_OTHER): Payer: Medicare HMO | Admitting: Family Medicine

## 2021-12-09 VITALS — BP 211/77 | HR 60 | Temp 98.2°F | Resp 16 | Ht 64.0 in | Wt 163.0 lb

## 2021-12-09 DIAGNOSIS — I10 Essential (primary) hypertension: Secondary | ICD-10-CM | POA: Diagnosis not present

## 2021-12-09 DIAGNOSIS — M2170 Unequal limb length (acquired), unspecified site: Secondary | ICD-10-CM

## 2021-12-09 DIAGNOSIS — I209 Angina pectoris, unspecified: Secondary | ICD-10-CM | POA: Diagnosis not present

## 2021-12-09 DIAGNOSIS — E785 Hyperlipidemia, unspecified: Secondary | ICD-10-CM

## 2021-12-09 DIAGNOSIS — Z8711 Personal history of peptic ulcer disease: Secondary | ICD-10-CM | POA: Insufficient documentation

## 2021-12-09 DIAGNOSIS — N1831 Chronic kidney disease, stage 3a: Secondary | ICD-10-CM

## 2021-12-09 MED ORDER — BYSTOLIC 20 MG PO TABS: 1.0000 | ORAL_TABLET | Freq: Two times a day (BID) | ORAL | 5 refills | Status: DC

## 2021-12-09 MED ORDER — ROSUVASTATIN CALCIUM 5 MG PO TABS: 5.0000 mg | ORAL_TABLET | Freq: Every day | ORAL | 3 refills | Status: DC

## 2021-12-09 NOTE — Assessment & Plan Note (Signed)
Didn't tolerate CCB.

## 2021-12-09 NOTE — Assessment & Plan Note (Signed)
Gust how we diagnose CKD 3 and discussed how her blood pressure really impacts her renal function.

## 2021-12-09 NOTE — Assessment & Plan Note (Signed)
Controlled.  There is been a noticeable difference in her blood pressure readings ever since she switched to the generic Bystolic.  I think at this point were going to try to request an exemption from her insurance plan to see if they will pay for the branded drug.  I do feel like her blood pressures were significantly better.

## 2021-12-09 NOTE — Assessment & Plan Note (Signed)
Aced on her last lipid levels I really do feel that she would benefit from a higher dose statin.

## 2021-12-09 NOTE — Progress Notes (Signed)
Established Patient Office Visit  Subjective:  Patient ID: Tina Medina, female    DOB: Jul 21, 1941  Age: 81 y.o. MRN: [id number]  CC:  Chief Complaint  Patient presents with   Hypertension    Follow up    Leg Pain    Left leg, 2 months. Patient states if feels like one leg is longer than the other/uneven walk.     HPI Tina Medina presents for   Hypertension- Pt denies chest pain, SOB, dizziness, or heart palpitations.  Taking meds as directed w/o problems.  Denies medication side effects.    Left leg -she has noticed lately that she feels like her gait is off she feels like her left leg is actually a little shorter and so it is more weak making her sort of tilt to that side when she tries to walk she gets some pain in her knee from time to time which she knows is arthritis and so that is not really any different than her baseline.  Follow-up CKD 3-no recent changes.  But she did have some questions about the diagnosis.  Also had some questions about a few other diagnoses on her chart.  Past Medical History:  Diagnosis Date   DDD (degenerative disc disease), lumbar    L spine, spinal stenosis   Gastritis    H Pylori   GERD (gastroesophageal reflux disease)    Hyperlipidemia    Hypertension    Lumbar degenerative disc disease 2002   with stenosis   Meningioma ([place]) 2010   8 mm in the R tentorium cerebelli   Menopause    since age 60   Migraine    Osteoma    2 mm of the L lid ethmoid air cells   Thyroid nodule     Past Surgical History:  Procedure Laterality Date   BREAST BIOPSY Left    BREAST EXCISIONAL BIOPSY Left    BREAST SURGERY  1969   Left   TUBAL LIGATION      Family History  Problem Relation Age of Onset   Hyperlipidemia Mother    Hypertension Mother    Diabetes Mother    Prostate cancer Father    Hypertension Brother     Social History   Socioeconomic History   Marital status: Widowed    Spouse name: Not on file   Number of children: 3    Years of education: 82   Highest education level: Some college, no degree  Occupational History   Occupation: Retired.      Employer: [employer]    Comment: Retired  Tobacco Use   Smoking status: Former    Packs/day: 0.00    Types: Cigarettes    Quit date: 12/05/1968    Years since quitting: 53.0   Smokeless tobacco: Former    Quit date: 12/05/1962   Tobacco comments:    light smoker  Vaping Use   Vaping Use: Never used  Substance and Sexual Activity   Alcohol use: Not Currently    Comment: Occasional glass of wine   Drug use: No   Sexual activity: Not Currently  Other Topics Concern   Not on file  Social History Narrative   Lives alone. She had three children, two living. Her daughters live in [place]. She enjoys decorating in her free time. She does stretching exercises for ten mins everyday.   Social Determinants of Health   Financial Resource Strain: Low Risk    Difficulty of Paying Living Expenses: Not hard  at all  Food Insecurity: No Food Insecurity   Worried About Charity fundraiser in the Last Year: Never true   Ran Out of Food in the Last Year: Never true  Transportation Needs: No Transportation Needs   Lack of Transportation (Medical): No   Lack of Transportation (Non-Medical): No  Physical Activity: Inactive   Days of Exercise per Week: 0 days   Minutes of Exercise per Session: 0 min  Stress: No Stress Concern Present   Feeling of Stress : Not at all  Social Connections: Moderately Integrated   Frequency of Communication with Friends and Family: More than three times a week   Frequency of Social Gatherings with Friends and Family: More than three times a week   Attends Religious Services: More than 4 times per year   Active Member of [employer] or Organizations: Yes   Attends Archivist Meetings: More than 4 times per year   Marital Status: Widowed  Human resources officer Violence: Not At Risk   Fear of Current or Ex-Partner: No   Emotionally Abused: No    Physically Abused: No   Sexually Abused: No    Outpatient Medications Prior to Visit  Medication Sig Dispense Refill   aspirin 81 MG chewable tablet Chew 81 mg by mouth daily.     hydrochlorothiazide (MICROZIDE) 12.5 MG capsule Take 1 capsule (12.5 mg total) by mouth daily. 90 capsule 1   Nebivolol HCl 20 MG TABS Take 1 tablet (20 mg total) by mouth 2 (two) times daily. 180 tablet 1   rosuvastatin (CRESTOR) 5 MG tablet Take 1 tablet (5 mg total) by mouth at bedtime. 30 tablet 11   No facility-administered medications prior to visit.    Allergies  Allergen Reactions   Ace Inhibitors     REACTION: cough Other reaction(s): Abdominal Pain, Chest Pain Patient develops cough, headaches   Atenolol    Benicar [Olmesartan Medoxomil] Other (See Comments)    Chest pain   Carvedilol Diarrhea    Complains of diarrhea and chest pain.    Clonidine Derivatives Other (See Comments)   Diltiazem Other (See Comments)    Sharp head ache.    Hydralazine Other (See Comments)    "Chills"    Losartan Other (See Comments)    headache   Metoprolol Diarrhea    Abdominal cramping.    Micardis [Telmisartan] Other (See Comments)    Chest Pain, Back Pain.    Penicillins     REACTION: rash   Verapamil Other (See Comments)    Headaches and constipation   Amlodipine Rash    And Headaches    ROS Review of Systems    Objective:    Physical Exam Constitutional:      Appearance: Normal appearance. She is well-developed.  HENT:     Head: Normocephalic and atraumatic.  Cardiovascular:     Rate and Rhythm: Normal rate and regular rhythm.     Heart sounds: Normal heart sounds.  Pulmonary:     Effort: Pulmonary effort is normal.     Breath sounds: Normal breath sounds.  Skin:    General: Skin is warm and dry.  Neurological:     Mental Status: She is alert and oriented to person, place, and time.  Psychiatric:        Behavior: Behavior normal.    BP (!) 211/77    Pulse 60    Temp 98.2 F  (36.8 C)    Resp 16    Ht 5' 4"  (  1.626 m)    Wt 163 lb (73.9 kg)    SpO2 100%    BMI 27.98 kg/m  Wt Readings from Last 3 Encounters:  12/09/21 163 lb (73.9 kg)  10/05/21 162 lb (73.5 kg)  08/13/21 162 lb 0.6 oz (73.5 kg)     There are no preventive care reminders to display for this patient.  There are no preventive care reminders to display for this patient.  Lab Results  Component Value Date   TSH 1.45 10/09/2019   Lab Results  Component Value Date   WBC 7.6 11/24/2020   HGB 14.7 11/24/2020   HCT 42.5 11/24/2020   MCV 91.8 11/24/2020   PLT 327 11/24/2020   Lab Results  Component Value Date   NA 142 10/05/2021   K 5.1 10/05/2021   CO2 29 10/05/2021   GLUCOSE 82 10/05/2021   BUN 13 10/05/2021   CREATININE 1.04 (H) 10/05/2021   BILITOT 0.5 10/05/2021   ALKPHOS 66 01/30/2015   AST 20 10/05/2021   ALT 13 10/05/2021   PROT 6.9 10/05/2021   ALBUMIN 4.1 01/30/2015   CALCIUM 9.9 10/05/2021   EGFR 54 (L) 10/05/2021   Lab Results  Component Value Date   CHOL 192 10/05/2021   Lab Results  Component Value Date   HDL 43 (L) 10/05/2021   Lab Results  Component Value Date   LDLCALC 124 (H) 10/05/2021   Lab Results  Component Value Date   TRIG 142 10/05/2021   Lab Results  Component Value Date   CHOLHDL 4.5 10/05/2021   Lab Results  Component Value Date   HGBA1C 5.4 09/03/2018      Assessment & Plan:   Problem List Items Addressed This Visit       Cardiovascular and Mediastinum   ESSENTIAL HYPERTENSION, BENIGN    Controlled.  There is been a noticeable difference in her blood pressure readings ever since she switched to the generic Bystolic.  I think at this point were going to try to request an exemption from her insurance plan to see if they will pay for the branded drug.  I do feel like her blood pressures were significantly better.      Relevant Medications   rosuvastatin (CRESTOR) 5 MG tablet   BYSTOLIC 20 MG TABS   Angina pectoris (HCC)     Didn't tolerate CCB.       Relevant Medications   rosuvastatin (CRESTOR) 5 MG tablet   BYSTOLIC 20 MG TABS     Genitourinary   CKD (chronic kidney disease) stage 3, GFR 30-59 ml/min (HCC) - Primary    Gust how we diagnose CKD 3 and discussed how her blood pressure really impacts her renal function.        Other   Hyperlipidemia    Aced on her last lipid levels I really do feel that she would benefit from a higher dose statin.      Relevant Medications   rosuvastatin (CRESTOR) 5 MG tablet   BYSTOLIC 20 MG TABS   Other Visit Diagnoses     Leg length discrepancy          Leg length discrepancy-gave her an insert to put in her shoe just on that 1 side to see if she notices a difference.  If it is helpful then she can go online and buy extra sets to put in her shoes.  If not then would like to get her in with one of our sports med docs for further  work-up and evaluation.  Meds ordered this encounter  Medications   rosuvastatin (CRESTOR) 5 MG tablet    Sig: Take 1 tablet (5 mg total) by mouth at bedtime.    Dispense:  90 tablet    Refill:  3    These disregard prescription sent earlier today for 90-day supply she would prefer to do 30-day supply because of cost.   BYSTOLIC 20 MG TABS    Sig: Take 1 tablet (20 mg total) by mouth in the morning and at bedtime.    Dispense:  60 tablet    Refill:  5    Follow-up: Return in about 2 weeks (around 12/23/2021) for Nurse visit for BP .    Beatrice Lecher, MD

## 2021-12-11 ENCOUNTER — Telehealth: Payer: Self-pay

## 2021-12-11 NOTE — Telephone Encounter (Signed)
Medication: BYSTOLIC 20 MG TABS (DAW) Prior authorization submitted via CoverMyMeds on 12/11/2021 [place] submission pending

## 2021-12-13 NOTE — Telephone Encounter (Signed)
Please let patient know that the request was denied to try to get the brand Bystolic approved.

## 2021-12-13 NOTE — Telephone Encounter (Signed)
Medication: BYSTOLIC 20 MG TABS (DAW) Prior authorization determination received Medication has been denied Reason for denial:  "The drug you asked for is not listed in your preferred drug list (formulary). The preferred drug(s), you may not have tried are: metoprolol tartrate tablet, bisoprolol fumarate tablet AND lisinopril tablet. Your provider needs to give [place] medical reasons why the preferred drug(s) would not work for you and/or would have bad side effects."  Appeal filed along with a copy of the LMN. Appeal was filed "URGENT"

## 2021-12-13 NOTE — Telephone Encounter (Signed)
Medication: BYSTOLIC 20 MG TABS (DAW) I filed an appeal for the brand BYSTOLIC 20 MG TABS (DAW)  The letter of medical necessity was provided along with the appeal. Appeal was been denied Reason for denial:  "We agree with our initial coverage determination and are denying the following prescription drug(s) that you or your physician or other prescriber requested: BYSTOLIC 20 MG TABLET. We denied this request because: This drug is not listed in your Drug Guide (formulary). There are preferred drugs you may not have tried. The preferred drugs are: bisoprolol fumarate tablet and lisinopril tablet."

## 2021-12-14 NOTE — Telephone Encounter (Signed)
Pt informed of denial from insurance company to cover brand name Bystolic.  Pt states that she will take the generic and is planning to add some herbs to her regimen that is supposed to aid in lower blood pressure.  Charyl Bigger, CMA

## 2021-12-23 ENCOUNTER — Ambulatory Visit: Payer: Medicare HMO

## 2022-01-24 ENCOUNTER — Other Ambulatory Visit: Payer: Self-pay

## 2022-01-24 ENCOUNTER — Ambulatory Visit (INDEPENDENT_AMBULATORY_CARE_PROVIDER_SITE_OTHER): Payer: Medicare HMO | Admitting: Family Medicine

## 2022-01-24 ENCOUNTER — Encounter: Payer: Self-pay | Admitting: Family Medicine

## 2022-01-24 VITALS — BP 210/60 | HR 59 | Resp 16 | Ht 64.0 in | Wt 160.0 lb

## 2022-01-24 DIAGNOSIS — N644 Mastodynia: Secondary | ICD-10-CM | POA: Diagnosis not present

## 2022-01-24 DIAGNOSIS — N1831 Chronic kidney disease, stage 3a: Secondary | ICD-10-CM | POA: Diagnosis not present

## 2022-01-24 DIAGNOSIS — R208 Other disturbances of skin sensation: Secondary | ICD-10-CM

## 2022-01-24 DIAGNOSIS — I10 Essential (primary) hypertension: Secondary | ICD-10-CM | POA: Diagnosis not present

## 2022-01-24 MED ORDER — DOXAZOSIN MESYLATE 1 MG PO TABS: 1.0000 mg | ORAL_TABLET | Freq: Every day | ORAL | 0 refills | Status: DC

## 2022-01-24 MED ORDER — HYDROCHLOROTHIAZIDE 12.5 MG PO CAPS: 12.5000 mg | ORAL_CAPSULE | Freq: Every day | ORAL | 1 refills | Status: DC

## 2022-01-24 NOTE — Assessment & Plan Note (Signed)
Labs are up to date.

## 2022-01-24 NOTE — Progress Notes (Signed)
Established Patient Office Visit  Subjective:  Patient ID: Tina Medina, female    DOB: [birth year]  Age: 81 y.o. MRN: [id number]  CC:  Chief Complaint  Patient presents with   Hypertension    Follow up    Head Concern    Patient complains of sharp pains in the top of her head off and on for 1 year. Getting worse    Leg Pain    Bilateral leg pain/back of legs for 3-4 weeks. No know injury. Pain occurs when standing.     HPI [name] presents for   Hypertension- Pt denies chest pain, SOB, dizziness, or heart palpitations.  Taking meds as directed w/o problems.  Denies medication side effects.  Home blood pressure log shows systolic blood pressures ranging from 138-182.  Diastolics ranging from 49-20.  Majority of the blood pressures in the 160s over mid 80s.  Left breast pain-she still having some discomfort in that left breast.  Last mammogram was in June.  She says its not daily but just bothers her a few times a week it is more sore and tender she has to make sure that she is wearing a certain bra and she says sometimes when she turns over in bed at night it is very tender.  She has not noticed any rashes, lumps, redness etc.  She also report reports burning in the rectum when she passes gas or with prolonged sitting.  She has noticed that her bowel movements have been a little bit more hard lately. History of hemorrhoids.  She denies any swelling or irritation per se.   Past Medical History:  Diagnosis Date   DDD (degenerative disc disease), lumbar    L spine, spinal stenosis   Gastritis    H Pylori   GERD (gastroesophageal reflux disease)    Hyperlipidemia    Hypertension    Lumbar degenerative disc disease 2002   with stenosis   Meningioma ([place]) 2010   8 mm in the R tentorium cerebelli   Menopause    since age 44   Migraine    Osteoma    2 mm of the L lid ethmoid air cells   Thyroid nodule     Past Surgical History:  Procedure Laterality Date   BREAST  BIOPSY Left    BREAST EXCISIONAL BIOPSY Left    BREAST SURGERY  1969   Left   TUBAL LIGATION      Family History  Problem Relation Age of Onset   Hyperlipidemia Mother    Hypertension Mother    Diabetes Mother    Prostate cancer Father    Hypertension Brother     Social History   Socioeconomic History   Marital status: Widowed    Spouse name: Not on file   Number of children: 3   Years of education: 97   Highest education level: Some college, no degree  Occupational History   Occupation: Retired.      Employer: [employer]    Comment: Retired  Tobacco Use   Smoking status: Former    Packs/day: 0.00    Types: Cigarettes    Quit date: 12/05/1968    Years since quitting: 53.1   Smokeless tobacco: Former    Quit date: 12/05/1962   Tobacco comments:    light smoker  Vaping Use   Vaping Use: Never used  Substance and Sexual Activity   Alcohol use: Not Currently    Comment: Occasional glass of wine   Drug  use: No   Sexual activity: Not Currently  Other Topics Concern   Not on file  Social History Narrative   Lives alone. She had three children, two living. Her daughters live in [place]. She enjoys decorating in her free time. She does stretching exercises for ten mins everyday.   Social Determinants of Health   Financial Resource Strain: Low Risk    Difficulty of Paying Living Expenses: Not hard at all  Food Insecurity: No Food Insecurity   Worried About Charity fundraiser in the Last Year: Never true   [place] in the Last Year: Never true  Transportation Needs: No Transportation Needs   Lack of Transportation (Medical): No   Lack of Transportation (Non-Medical): No  Physical Activity: Inactive   Days of Exercise per Week: 0 days   Minutes of Exercise per Session: 0 min  Stress: No Stress Concern Present   Feeling of Stress : Not at all  Social Connections: Moderately Integrated   Frequency of Communication with Friends and Family: More than three times a  week   Frequency of Social Gatherings with Friends and Family: More than three times a week   Attends Religious Services: More than 4 times per year   Active Member of [employer] or Organizations: Yes   Attends Archivist Meetings: More than 4 times per year   Marital Status: Widowed  Human resources officer Violence: Not At Risk   Fear of Current or Ex-Partner: No   Emotionally Abused: No   Physically Abused: No   Sexually Abused: No    Outpatient Medications Prior to Visit  Medication Sig Dispense Refill   aspirin 81 MG chewable tablet Chew 81 mg by mouth daily.     BYSTOLIC 20 MG TABS Take 1 tablet (20 mg total) by mouth in the morning and at bedtime. 60 tablet 5   rosuvastatin (CRESTOR) 5 MG tablet Take 1 tablet (5 mg total) by mouth at bedtime. 90 tablet 3   hydrochlorothiazide (MICROZIDE) 12.5 MG capsule Take 1 capsule (12.5 mg total) by mouth daily. 90 capsule 1   No facility-administered medications prior to visit.    Allergies  Allergen Reactions   Ace Inhibitors     REACTION: cough Other reaction(s): Abdominal Pain, Chest Pain Patient develops cough, headaches   Atenolol    Benicar [Olmesartan Medoxomil] Other (See Comments)    Chest pain   Carvedilol Diarrhea    Complains of diarrhea and chest pain.    Clonidine Derivatives Other (See Comments)   Diltiazem Other (See Comments)    Sharp head ache.    Hydralazine Other (See Comments)    "Chills"    Losartan Other (See Comments)    headache   Metoprolol Diarrhea    Abdominal cramping.    Micardis [Telmisartan] Other (See Comments)    Chest Pain, Back Pain.    Penicillins     REACTION: rash   Verapamil Other (See Comments)    Headaches and constipation   Amlodipine Rash    And Headaches    ROS Review of Systems    Objective:    Physical Exam Constitutional:      Appearance: Normal appearance. She is well-developed.  HENT:     Head: Normocephalic and atraumatic.  Cardiovascular:     Rate and  Rhythm: Normal rate and regular rhythm.     Heart sounds: Normal heart sounds.  Pulmonary:     Effort: Pulmonary effort is normal.     Breath  sounds: Normal breath sounds.  Skin:    General: Skin is warm and dry.  Neurological:     Mental Status: She is alert and oriented to person, place, and time.  Psychiatric:        Behavior: Behavior normal.   BP (!) 210/60 (BP Location: Right Arm)    Pulse (!) 59    Resp 16    Ht 5' 4"  (1.626 m)    Wt 160 lb (72.6 kg)    SpO2 100%    BMI 27.46 kg/m  Wt Readings from Last 3 Encounters:  01/24/22 160 lb (72.6 kg)  12/09/21 163 lb (73.9 kg)  10/05/21 162 lb (73.5 kg)     There are no preventive care reminders to display for this patient.   There are no preventive care reminders to display for this patient.  Lab Results  Component Value Date   TSH 1.45 10/09/2019   Lab Results  Component Value Date   WBC 7.6 11/24/2020   HGB 14.7 11/24/2020   HCT 42.5 11/24/2020   MCV 91.8 11/24/2020   PLT 327 11/24/2020   Lab Results  Component Value Date   NA 142 10/05/2021   K 5.1 10/05/2021   CO2 29 10/05/2021   GLUCOSE 82 10/05/2021   BUN 13 10/05/2021   CREATININE 1.04 (H) 10/05/2021   BILITOT 0.5 10/05/2021   ALKPHOS 66 01/30/2015   AST 20 10/05/2021   ALT 13 10/05/2021   PROT 6.9 10/05/2021   ALBUMIN 4.1 01/30/2015   CALCIUM 9.9 10/05/2021   EGFR 54 (L) 10/05/2021   Lab Results  Component Value Date   CHOL 192 10/05/2021   Lab Results  Component Value Date   HDL 43 (L) 10/05/2021   Lab Results  Component Value Date   LDLCALC 124 (H) 10/05/2021   Lab Results  Component Value Date   TRIG 142 10/05/2021   Lab Results  Component Value Date   CHOLHDL 4.5 10/05/2021   Lab Results  Component Value Date   HGBA1C 5.4 09/03/2018      Assessment & Plan:   Problem List Items Addressed This Visit       Cardiovascular and Mediastinum   ESSENTIAL HYPERTENSION, BENIGN - Primary    Discussed options.  She did do better  on brand nebivolol her blood pressures were better controlled.  Home pressures mostly running in the 160s over 80s.  We will add 1 mg of Cardura since it looks like based on her intolerance list we have never tried an alpha-blocker.  New prescription sent to pharmacy she prefer just a small quantity to try before committing to a full 30-day.  If she tolerates it well then we will have a follow-up in a month for blood pressure check and she can bring in her home numbers as well.      Relevant Medications   doxazosin (CARDURA) 1 MG tablet   hydrochlorothiazide (MICROZIDE) 12.5 MG capsule     Genitourinary   CKD (chronic kidney disease) stage 3, GFR 30-59 ml/min (HCC)    Labs are up to date.        Other Visit Diagnoses     Breast pain, left       Relevant Orders   MM Digital Diagnostic Unilat L   US BREAST COMPLETE UNI LEFT INC AXILLA   Rectal burning       Relevant Orders   Ambulatory referral to Gastroenterology       Left breast pain-we will work-up  further with diagnostic mammogram and ultrasound.  Burning-declined rectal exam today.  Will refer to GI for further evaluation she would prefer to see a female.  She feels like it is definitely more internal and not external.  She does have a history of hemorrhoids but they have not been active or bleeding.  Stools have been a little bit more hard.  Meds ordered this encounter  Medications   doxazosin (CARDURA) 1 MG tablet    Sig: Take 1 tablet (1 mg total) by mouth daily.    Dispense:  10 tablet    Refill:  0   hydrochlorothiazide (MICROZIDE) 12.5 MG capsule    Sig: Take 1 capsule (12.5 mg total) by mouth daily.    Dispense:  90 capsule    Refill:  1    Follow-up: No follow-ups on file.    Beatrice Lecher, MD

## 2022-01-24 NOTE — Assessment & Plan Note (Signed)
Discussed options.  She did do better on brand nebivolol her blood pressures were better controlled.  Home pressures mostly running in the 160s over 80s.  We will add 1 mg of Cardura since it looks like based on her intolerance list we have never tried an alpha-blocker.  New prescription sent to pharmacy she prefer just a small quantity to try before committing to a full 30-day.  If she tolerates it well then we will have a follow-up in a month for blood pressure check and she can bring in her home numbers as well.

## 2022-02-01 ENCOUNTER — Telehealth: Payer: Self-pay

## 2022-02-01 NOTE — Telephone Encounter (Signed)
Patient stated she stopped the doxazosin because she has been having low back pain and diarrhea. Forward to Dr. Madilyn Fireman for review.

## 2022-02-02 NOTE — Telephone Encounter (Signed)
OK, noted

## 2022-02-03 DIAGNOSIS — J439 Emphysema, unspecified: Secondary | ICD-10-CM | POA: Diagnosis not present

## 2022-02-03 DIAGNOSIS — K640 First degree hemorrhoids: Secondary | ICD-10-CM | POA: Diagnosis not present

## 2022-02-03 DIAGNOSIS — R208 Other disturbances of skin sensation: Secondary | ICD-10-CM | POA: Diagnosis not present

## 2022-02-16 ENCOUNTER — Encounter: Payer: Self-pay | Admitting: Family Medicine

## 2022-02-16 ENCOUNTER — Ambulatory Visit (INDEPENDENT_AMBULATORY_CARE_PROVIDER_SITE_OTHER): Payer: Medicare HMO

## 2022-02-16 ENCOUNTER — Other Ambulatory Visit: Payer: Self-pay

## 2022-02-16 ENCOUNTER — Ambulatory Visit (INDEPENDENT_AMBULATORY_CARE_PROVIDER_SITE_OTHER): Payer: Medicare HMO | Admitting: Family Medicine

## 2022-02-16 VITALS — BP 190/56 | HR 56 | Resp 20 | Ht 64.0 in | Wt 159.0 lb

## 2022-02-16 DIAGNOSIS — M25561 Pain in right knee: Secondary | ICD-10-CM

## 2022-02-16 DIAGNOSIS — M25562 Pain in left knee: Secondary | ICD-10-CM | POA: Diagnosis not present

## 2022-02-16 DIAGNOSIS — M79661 Pain in right lower leg: Secondary | ICD-10-CM

## 2022-02-16 DIAGNOSIS — M7989 Other specified soft tissue disorders: Secondary | ICD-10-CM | POA: Diagnosis not present

## 2022-02-16 DIAGNOSIS — M7732 Calcaneal spur, left foot: Secondary | ICD-10-CM | POA: Diagnosis not present

## 2022-02-16 NOTE — Progress Notes (Signed)
Tina Medina, x-rays on the right knee look pretty good overall.  The joint spaces pretty well-maintained.  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 Darene Lamer to take a look at that and to discuss some treatment options.

## 2022-02-16 NOTE — Progress Notes (Signed)
Tina Medina, the left knee does not look too bad.  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  He would actually do great with physical therapy.

## 2022-02-16 NOTE — Progress Notes (Signed)
Hi Jala, no sign of blood clot.  They did see what is called a Baker's cyst in the back of your knee which definitely could be causing some discomfort as well as some of the swelling that you are having.  That something that we could always get you in with Dr. Darene Lamer to take a look at and evaluate.

## 2022-02-16 NOTE — Progress Notes (Signed)
? ?Acute Office Visit ? ?Subjective:  ? ? Patient ID: Tina Medina, female    DOB: 1941/06/29, 81 y.o.   MRN: [id number] ? ?Chief Complaint  ?Patient presents with  ? Knee Pain  ? right leg pain  ? ? ?HPI ?Patient is in today for Bilateral knee pain and right leg pain.  Hard to walk on the right leg.  But comes and goes.   ? ?She says she actually started initially about 3 to 4 weeks ago having pain in her right calf it would radiate down into her Achilles.  She has been working in the yard just doing some light work nothing heavy and thought maybe it got irritated in the yard.  Then she started to gradually have bilateral knee pain.  She was experiencing some swelling in the right knee in particular.  Again denied any injury or trauma.  She has been doing a rub and some Epsom salt soaks for her knees and feels like that has been helping some.  She was concerned we should because she is continued to have the right calf pain.  Prior history of DVT. ? ?He also had some right rib soreness that lasted just a short period of time its actually resolved on its own but she does not member any injury or trauma but was also on the right side of her body so she just wanted to mention that. ? ?She did see GI for the rectal burning and they felt it was her hemorrhoids.   ? ?Past Medical History:  ?Diagnosis Date  ? DDD (degenerative disc disease), lumbar   ? L spine, spinal stenosis  ? Gastritis   ? H Pylori  ? GERD (gastroesophageal reflux disease)   ? Hyperlipidemia   ? Hypertension   ? Lumbar degenerative disc disease 2002  ? with stenosis  ? 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 Menopause   ? since age 1  ? Migraine   ? Osteoma   ? 2 mm of the L lid ethmoid air cells  ? Thyroid nodule   ? ? ?Past Surgical History:  ?Procedure Laterality Date  ? BREAST BIOPSY Left   ? BREAST EXCISIONAL BIOPSY Left   ? BREAST SURGERY  1969  ? Left  ? TUBAL LIGATION    ? ? ?Family History  ?Problem Relation Age of Onset   ? Hyperlipidemia Mother   ? Hypertension Mother   ? Diabetes Mother   ? Prostate cancer Father   ? Hypertension Brother   ? ? ?Social History  ? ?Socioeconomic History  ? Marital status: Widowed  ?  Spouse name: Not on file  ? Number of children: 3  ? Years of education: 38  ? Highest education level: Some college, no degree  ?Occupational History  ? Occupation: Retired.    ?  Employer: [employer]  ?  Comment: Retired  ?Tobacco Use  ? Smoking status: Former  ?  Packs/day: 0.00  ?  Types: Cigarettes  ?  Quit date: 12/05/1968  ?  Years since quitting: 53.2  ? Smokeless tobacco: Former  ?  Quit date: 12/05/1962  ? Tobacco comments:  ?  light smoker  ?Vaping Use  ? Vaping Use: Never used  ?Substance and Sexual Activity  ? Alcohol use: Not Currently  ?  Comment: Occasional glass of wine  ? Drug use: No  ? Sexual activity: Not Currently  ?Other Topics Concern  ? Not on  file  ?Social History Narrative  ? Lives alone. She had three children, two living. Her daughters live in [place]. She enjoys decorating in her free time. She does stretching exercises for ten mins everyday.  ? ?Social Determinants of Health  ? ?Financial Resource Strain: Low Risk   ? Difficulty of Paying Living Expenses: Not hard at all  ?Food Insecurity: No Food Insecurity  ? Worried About Charity fundraiser in the Last Year: Never true  ? Ran Out of Food in the Last Year: Never true  ?Transportation Needs: No Transportation Needs  ? Lack of Transportation (Medical): No  ? Lack of Transportation (Non-Medical): No  ?Physical Activity: Inactive  ? Days of Exercise per Week: 0 days  ? Minutes of Exercise per Session: 0 min  ?Stress: No Stress Concern Present  ? Feeling of Stress : Not at all  ?Social Connections: Moderately Integrated  ? Frequency of Communication with Friends and Family: More than three times a week  ? Frequency of Social Gatherings with Friends and Family: More than three times a week  ? Attends Religious Services: More than 4 times per year  ?  Active Member of Clubs or Organizations: Yes  ? Attends Archivist Meetings: More than 4 times per year  ? Marital Status: Widowed  ?Intimate Partner Violence: Not At Risk  ? Fear of Current or Ex-Partner: No  ? Emotionally Abused: No  ? Physically Abused: No  ? Sexually Abused: No  ? ? ?Outpatient Medications Prior to Visit  ?Medication Sig Dispense Refill  ? aspirin 81 MG EC tablet Take by mouth.    ? BYSTOLIC 20 MG TABS Take 1 tablet (20 mg total) by mouth in the morning and at bedtime. 60 tablet 5  ? hydrochlorothiazide (MICROZIDE) 12.5 MG capsule Take 1 capsule (12.5 mg total) by mouth daily. 90 capsule 1  ? hydrocortisone (ANUSOL-HC) 25 MG suppository Place rectally.    ? rosuvastatin (CRESTOR) 5 MG tablet Take 1 tablet (5 mg total) by mouth at bedtime. 90 tablet 3  ? aspirin 81 MG chewable tablet Chew 81 mg by mouth daily.    ? ?No facility-administered medications prior to visit.  ? ? ?Allergies  ?Allergen Reactions  ? Ace Inhibitors   ?  REACTION: cough ?Other reaction(s): Abdominal Pain, Chest Pain ?Patient develops cough, headaches  ? Atenolol   ? Benicar [Olmesartan Medoxomil] Other (See Comments)  ?  Chest pain  ? Cardura [Doxazosin] Diarrhea  ? Carvedilol Diarrhea  ?  Complains of diarrhea and chest pain.   ? Clonidine Derivatives Other (See Comments)  ? Diltiazem Other (See Comments)  ?  Sharp head ache.   ? Hydralazine Other (See Comments)  ?  "Chills"   ? Losartan Other (See Comments)  ?  headache  ? Metoprolol Diarrhea  ?  Abdominal cramping.   ? Micardis [Telmisartan] Other (See Comments)  ?  Chest Pain, Back Pain.   ? Penicillins   ?  REACTION: rash  ? Verapamil Other (See Comments)  ?  Headaches and constipation  ? Amlodipine Rash  ?  And Headaches  ? ? ?Review of Systems ? ?   ?Objective:  ?  ?Physical Exam ?Vitals reviewed.  ?Constitutional:   ?   Appearance: She is well-developed.  ?HENT:  ?   Head: Normocephalic and atraumatic.  ?Eyes:  ?   Conjunctiva/sclera: Conjunctivae normal.   ?Cardiovascular:  ?   Rate and Rhythm: Normal rate.  ?Pulmonary:  ?  Effort: Pulmonary effort is normal.  ?Musculoskeletal:  ?   Comments: Right knee with a little bit of swelling more medially.  The left knee is a little bit more swollen distal to the patella.  Normal flexion and extension.  She did have some crepitus in the right knee.  Both knees are nontender on exam.  She does have a little tenderness in her right calf muscle.  No significant pain with dorsiflexion.  ?Skin: ?   General: Skin is dry.  ?   Coloration: Skin is not pale.  ?Neurological:  ?   Mental Status: She is alert and oriented to person, place, and time.  ?Psychiatric:     ?   Behavior: Behavior normal.  ? ? ?BP (!) 190/56 (BP Location: Left Arm, Cuff Size: Normal)   Pulse (!) 56   Resp 20   Ht 5' 4"  (1.626 m)   Wt 159 lb 0.6 oz (72.1 kg)   SpO2 99%   BMI 27.30 kg/m?  ?Wt Readings from Last 3 Encounters:  ?02/16/22 159 lb 0.6 oz (72.1 kg)  ?01/24/22 160 lb (72.6 kg)  ?12/09/21 163 lb (73.9 kg)  ? ? ?Health Maintenance Due  ?Topic Date Due  ? COVID-19 Vaccine (4 - Booster for Moderna series) 11/23/2020  ? ? ?There are no preventive care reminders to display for this patient. ? ? ?Lab Results  ?Component Value Date  ? TSH 1.45 10/09/2019  ? ?Lab Results  ?Component Value Date  ? WBC 7.6 11/24/2020  ? HGB 14.7 11/24/2020  ? HCT 42.5 11/24/2020  ? MCV 91.8 11/24/2020  ? PLT 327 11/24/2020  ? ?Lab Results  ?Component Value Date  ? NA 142 10/05/2021  ? K 5.1 10/05/2021  ? CO2 29 10/05/2021  ? GLUCOSE 82 10/05/2021  ? BUN 13 10/05/2021  ? CREATININE 1.04 (H) 10/05/2021  ? BILITOT 0.5 10/05/2021  ? ALKPHOS 66 01/30/2015  ? AST 20 10/05/2021  ? ALT 13 10/05/2021  ? PROT 6.9 10/05/2021  ? ALBUMIN 4.1 01/30/2015  ? CALCIUM 9.9 10/05/2021  ? EGFR 54 (L) 10/05/2021  ? ?Lab Results  ?Component Value Date  ? CHOL 192 10/05/2021  ? ?Lab Results  ?Component Value Date  ? HDL 43 (L) 10/05/2021  ? ?Lab Results  ?Component Value Date  ? LDLCALC 124 (H)  10/05/2021  ? ?Lab Results  ?Component Value Date  ? TRIG 142 10/05/2021  ? ?Lab Results  ?Component Value Date  ? CHOLHDL 4.5 10/05/2021  ? ?Lab Results  ?Component Value Date  ? HGBA1C 5.4 09/03/2018  ? ?

## 2022-02-17 DIAGNOSIS — Z0100 Encounter for examination of eyes and vision without abnormal findings: Secondary | ICD-10-CM | POA: Diagnosis not present

## 2022-02-21 ENCOUNTER — Ambulatory Visit (INDEPENDENT_AMBULATORY_CARE_PROVIDER_SITE_OTHER): Payer: Medicare HMO

## 2022-02-21 ENCOUNTER — Ambulatory Visit (INDEPENDENT_AMBULATORY_CARE_PROVIDER_SITE_OTHER): Payer: Medicare HMO | Admitting: Sports Medicine

## 2022-02-21 ENCOUNTER — Other Ambulatory Visit: Payer: Self-pay

## 2022-02-21 DIAGNOSIS — G8929 Other chronic pain: Secondary | ICD-10-CM

## 2022-02-21 DIAGNOSIS — M545 Low back pain, unspecified: Secondary | ICD-10-CM

## 2022-02-21 DIAGNOSIS — M48061 Spinal stenosis, lumbar region without neurogenic claudication: Secondary | ICD-10-CM

## 2022-02-21 DIAGNOSIS — R202 Paresthesia of skin: Secondary | ICD-10-CM

## 2022-02-21 DIAGNOSIS — R200 Anesthesia of skin: Secondary | ICD-10-CM | POA: Diagnosis not present

## 2022-02-21 DIAGNOSIS — D320 Benign neoplasm of cerebral meninges: Secondary | ICD-10-CM

## 2022-02-21 NOTE — Assessment & Plan Note (Addendum)
This is a pleasant 81 year old female, she has a long history of mild low back pain with weakness intermittently of her right leg, particularly at night when she wakes up. ?She also had some swelling and achiness in her leg and thigh. ?Seen by her PCP, x-rays were ordered, ultrasound, she did have what appeared to be a small Baker's cyst and some knee osteoarthritis. ?On exam today she has very minimal discomfort around her knee, maybe a mild effusion, I am not able to palpate the Baker's cyst, exam is unremarkable. ?She does endorse some discomfort in her calf, considering the weakness I do think the principal issue is radicular though she certainly did have a knee effusion, Baker's cyst and knee osteoarthritis. ?Her strength, motion is good today. ?[name] the pathophysiology, she declined steroids, adding home physical therapy, x-rays. ?We will give her the beginner herniated disc physical therapy, we can graduate her to the advanced home physical therapy in the future if needed. ?Formal physical therapy co-pays were too high. ?Return to see me in 6 weeks, we will consider an MRI if no better. ?

## 2022-02-21 NOTE — Assessment & Plan Note (Signed)
Left-sided tentorial meningioma unchanged over several years, and not in the right place to cause her leg symptoms anyway. ?

## 2022-02-21 NOTE — Progress Notes (Signed)
? ? ?  Procedures performed today:   ? ?None. ? ?Independent interpretation of notes and tests performed by another provider:  ? ?None. ? ?Brief History, Exam, Impression, and Recommendations:   ? ?Lumbar spinal stenosis ?This is a pleasant 81 year old female, she has a long history of mild low back pain with weakness intermittently of her right leg, particularly at night when she wakes up. ?She also had some swelling and achiness in her leg and thigh. ?Seen by her PCP, x-rays were ordered, ultrasound, she did have what appeared to be a small Baker's cyst and some knee osteoarthritis. ?On exam today she has very minimal discomfort around her knee, maybe a mild effusion, I am not able to palpate the Baker's cyst, exam is unremarkable. ?She does endorse some discomfort in her calf, considering the weakness I do think the principal issue is radicular though she certainly did have a knee effusion, Baker's cyst and knee osteoarthritis. ?Her strength, motion is good today. ?[name] the pathophysiology, she declined steroids, adding home physical therapy, x-rays. ?We will give her the beginner herniated disc physical therapy, we can graduate her to the advanced home physical therapy in the future if needed. ?Formal physical therapy co-pays were too high. ?Return to see me in 6 weeks, we will consider an MRI if no better. ? ?MENINGIOMA ?Left-sided tentorial meningioma unchanged over several years, and not in the right place to cause her leg symptoms anyway. ? ? ? ?___________________________________________ ?Gwen Her. Dianah Field, M.D., ABFM., CAQSM. ?Primary Care and Sports Medicine ?[place] ? ?Adjunct Instructor of Family Medicine  ?[employer] ?

## 2022-02-23 ENCOUNTER — Other Ambulatory Visit: Payer: Self-pay

## 2022-02-23 ENCOUNTER — Ambulatory Visit (INDEPENDENT_AMBULATORY_CARE_PROVIDER_SITE_OTHER): Payer: Medicare HMO | Admitting: Family Medicine

## 2022-02-23 VITALS — BP 169/79 | HR 53 | Ht 64.0 in | Wt 158.0 lb

## 2022-02-23 DIAGNOSIS — I10 Essential (primary) hypertension: Secondary | ICD-10-CM | POA: Diagnosis not present

## 2022-02-23 NOTE — Progress Notes (Signed)
Patient is here for blood pressure check.  ? ?Previous BP was 190/56 ? ?1st BP today: 169/79 ? ?2nd BP today (after 10  minutes): 171/69 ? ?Denies chest pain, dizziness, shortness of breath, severe headache, or nosebleeds. ? ?Taking medication as prescribed. Denies missed doses. ?Pt states she is unable to take HCTZ daily due to increased urinary activity creating schedule interference. ? ? ?

## 2022-02-23 NOTE — Progress Notes (Signed)
Agree with documentation as above.  Sinew current regimen. ? ?Beatrice Lecher, MD ? ?

## 2022-02-25 ENCOUNTER — Ambulatory Visit
Admission: RE | Admit: 2022-02-25 | Discharge: 2022-02-25 | Disposition: A | Discharge status: Home / Self Care | Payer: Medicare HMO | Source: Ambulatory Visit | Attending: Family Medicine | Admitting: Family Medicine

## 2022-02-25 ENCOUNTER — Other Ambulatory Visit: Payer: Self-pay

## 2022-02-25 DIAGNOSIS — N644 Mastodynia: Secondary | ICD-10-CM

## 2022-02-25 DIAGNOSIS — R6889 Other general symptoms and signs: Secondary | ICD-10-CM | POA: Diagnosis not present

## 2022-02-28 ENCOUNTER — Other Ambulatory Visit: Payer: Self-pay | Admitting: Family Medicine

## 2022-02-28 DIAGNOSIS — Z1231 Encounter for screening mammogram for malignant neoplasm of breast: Secondary | ICD-10-CM

## 2022-03-18 ENCOUNTER — Encounter: Payer: Self-pay | Admitting: *Deleted

## 2022-03-18 ENCOUNTER — Telehealth: Payer: Self-pay | Admitting: *Deleted

## 2022-03-18 NOTE — Telephone Encounter (Signed)
Pt called and lvm asking if there is something that she can do or take for the pain? ?

## 2022-03-29 ENCOUNTER — Encounter: Payer: Self-pay | Admitting: Family Medicine

## 2022-03-29 NOTE — Telephone Encounter (Signed)
Needs appt with Dr. Darene Lamer if having symptoms. ?

## 2022-04-04 ENCOUNTER — Ambulatory Visit (INDEPENDENT_AMBULATORY_CARE_PROVIDER_SITE_OTHER): Payer: Medicare HMO | Admitting: Sports Medicine

## 2022-04-04 DIAGNOSIS — M722 Plantar fascial fibromatosis: Secondary | ICD-10-CM | POA: Diagnosis not present

## 2022-04-04 MED ORDER — ACETAMINOPHEN ER 650 MG PO TBCR: 650.0000 mg | EXTENDED_RELEASE_TABLET | Freq: Three times a day (TID) | ORAL | 3 refills | Status: DC | PRN

## 2022-04-04 NOTE — Assessment & Plan Note (Addendum)
Classic plantar fasciitis, adding custom orthotics, home conditioning, she will continue avoidance of barefoot walking, she will also go to arthritis from Tylenol. ?Return to see me in 6 weeks, injection if not better. ?

## 2022-04-04 NOTE — Progress Notes (Addendum)
? ? ?  Procedures performed today:   ? ?None. ? ?Independent interpretation of notes and tests performed by another provider:  ? ?None. ? ?Brief History, Exam, Impression, and Recommendations:   ? ?Plantar fasciitis, right ?Classic plantar fasciitis, adding custom orthotics, home conditioning, she will continue avoidance of barefoot walking, she will also go to arthritis from Tylenol. ?Return to see me in 6 weeks, injection if not better. ? ? ? ?___________________________________________ ?Gwen Her. Dianah Field, M.D., ABFM., CAQSM. ?Primary Care and Sports Medicine ?[place] ? ?Adjunct Instructor of Family Medicine  ?[employer] ?

## 2022-04-05 ENCOUNTER — Encounter: Payer: Self-pay | Admitting: Sports Medicine

## 2022-04-13 ENCOUNTER — Ambulatory Visit: Payer: Medicare HMO | Admitting: Family Medicine

## 2022-04-13 ENCOUNTER — Encounter: Payer: Self-pay | Admitting: Family Medicine

## 2022-04-13 DIAGNOSIS — M722 Plantar fascial fibromatosis: Secondary | ICD-10-CM | POA: Diagnosis not present

## 2022-04-13 DIAGNOSIS — R6889 Other general symptoms and signs: Secondary | ICD-10-CM | POA: Diagnosis not present

## 2022-04-13 NOTE — Progress Notes (Signed)
?  Tina Medina - 81 y.o. female MRN [id number]  Date of birth: 12/30/1940 ? ?SUBJECTIVE:  Including CC & ROS.  ?No chief complaint on file. ? ? ?Tina Medina is a 81 y.o. female that is presenting with acute right foot pain.  The pain is occurring at the heel.  It is worse with first steps in the morning. ? ? ? ?Review of Systems ?See HPI  ? ?HISTORY: Past Medical, Surgical, Social, and Family History Reviewed & Updated per EMR.   ?Pertinent Historical Findings include: ? ?Past Medical History:  ?Diagnosis Date  ? DDD (degenerative disc disease), lumbar   ? L spine, spinal stenosis  ? Gastritis   ? H Pylori  ? GERD (gastroesophageal reflux disease)   ? Hyperlipidemia   ? Hypertension   ? Lumbar degenerative disc disease 2002  ? with stenosis  ? 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 Menopause   ? since age 89  ? Migraine   ? Osteoma   ? 2 mm of the L lid ethmoid air cells  ? Thyroid nodule   ? ? ?Past Surgical History:  ?Procedure Laterality Date  ? BREAST BIOPSY Left   ? BREAST EXCISIONAL BIOPSY Left   ? BREAST SURGERY  1969  ? Left  ? TUBAL LIGATION    ? ? ? ?PHYSICAL EXAM:  ?VS: Ht '5\' 4"'$  (1.626 m)   Wt 157 lb (71.2 kg)   BMI 26.95 kg/m?  ?Physical Exam ?Gen: NAD, alert, cooperative with exam, well-appearing ?MSK:  ?Neurovascularly intact   ? ?Patient was fitted for a standard, cushioned, semi-rigid orthotic. ?The orthotic was heated and afterward the patient stood on the orthotic blank positioned on the orthotic stand. ?The patient was positioned in subtalar neutral position and 10 degrees of ankle dorsiflexion in a weight bearing stance. ?After completion of molding, a stable base was applied to the orthotic blank. ?The blank was ground to a stable position for weight bearing. ?Size: 53F ?Pairs: 2 ?Base: [facility] EVA ?Additional Posting and Padding: None ?The patient ambulated these, and they were very comfortable. ? ? ? ? ?ASSESSMENT & PLAN:  ? ?Plantar fasciitis, right ?Acutely occurring.   Symptoms seem most consistent with Planter fasciitis. ?-Counseled on home exercise therapy and supportive care. ?-Orthotics. ? ? ? ? ?

## 2022-04-13 NOTE — Assessment & Plan Note (Signed)
Acutely occurring.  Symptoms seem most consistent with Planter fasciitis. ?-Counseled on home exercise therapy and supportive care. ?-Orthotics. ? ?

## 2022-05-12 ENCOUNTER — Ambulatory Visit (INDEPENDENT_AMBULATORY_CARE_PROVIDER_SITE_OTHER): Payer: Medicare HMO

## 2022-05-12 DIAGNOSIS — Z1231 Encounter for screening mammogram for malignant neoplasm of breast: Secondary | ICD-10-CM

## 2022-05-13 NOTE — Progress Notes (Signed)
Please call patient. Normal mammogram.  Repeat in 1 year.  

## 2022-05-17 ENCOUNTER — Ambulatory Visit (INDEPENDENT_AMBULATORY_CARE_PROVIDER_SITE_OTHER): Payer: Medicare HMO | Admitting: Family Medicine

## 2022-05-17 ENCOUNTER — Other Ambulatory Visit: Payer: Self-pay | Admitting: Family Medicine

## 2022-05-17 ENCOUNTER — Encounter: Payer: Self-pay | Admitting: Family Medicine

## 2022-05-17 VITALS — BP 172/76 | HR 54 | Ht 64.0 in | Wt 156.0 lb

## 2022-05-17 DIAGNOSIS — L989 Disorder of the skin and subcutaneous tissue, unspecified: Secondary | ICD-10-CM | POA: Diagnosis not present

## 2022-05-17 DIAGNOSIS — L280 Lichen simplex chronicus: Secondary | ICD-10-CM | POA: Diagnosis not present

## 2022-05-17 DIAGNOSIS — N1831 Chronic kidney disease, stage 3a: Secondary | ICD-10-CM

## 2022-05-17 NOTE — Progress Notes (Signed)
   Acute Office Visit  Subjective:     Patient ID: Tina Medina, female    DOB: September 06, 1941, 81 y.o.   MRN: [id number]  No chief complaint on file.   HPI Patient is in today for a rough small growth at the nape of her neck near the hairline on the left side.  She said she noticed it about 2 months ago when she was doing her hair.  Its not been painful or itchy or bleeding but it also does not seem to be healing.  She said she put on thing to dry it up but it really did not seem to change the lesion.  ROS      Objective:    BP (!) 172/76   Pulse (!) 54   Ht '5\' 4"'$  (1.626 m)   Wt 156 lb (70.8 kg)   SpO2 100%   BMI 26.78 kg/m     Physical Exam  No results found for any visits on 05/17/22.      Assessment & Plan:   Problem List Items Addressed This Visit       Genitourinary   CKD (chronic kidney disease) stage 3, GFR 30-59 ml/min (HCC)   Relevant Orders   BASIC METABOLIC PANEL WITH GFR   Urine Microalbumin w/creat. ratio   Other Visit Diagnoses     Skin lesion    -  Primary   Relevant Orders   Surgical pathology      [name] doing a shave biopsy.  I am concerned that its been 2 months since really not healing.  She has been putting things on it to dried up but it just really has not gone away.  She not having any bleeding or itching which is reassuring.  Recommend shave biopsy for further evaluation.  Follow-up wound care discussed  Shave Biopsy Procedure Note  Pre-operative Diagnosis: Suspicious lesion  Post-operative Diagnosis: same  Locations: Back of neck, left side  Indications: Not healing  Anesthesia: Lidocaine 1% with epinephrine with added sodium bicarbonate  Procedure Details  Patient informed of the risks (including bleeding and infection) and benefits of the  procedure and Verbal informed consent obtained.  The lesion and surrounding area were given a sterile prep using chlorhexidine . A double blade was used to shave an area of skin  approximately 54m by 531m  Hemostasis achieved with alumuninum chloride.  Petroleum ointment and a sterile dressing applied.  The specimen was sent for pathologic examination. The patient tolerated the procedure well.  EBL: Trace  Findings: Await pathology results  Condition: Stable  Complications: none.  Plan: 1. Instructed to keep the wound dry and covered for 24-48h and clean thereafter. 2. Warning signs of infection were reviewed.   3. Recommended that the patient use OTC acetaminophen as needed for pain.  4. Return as needed  No orders of the defined types were placed in this encounter.   Return if symptoms worsen or fail to improve.  CaBeatrice LecherMD

## 2022-05-18 LAB — MICROALBUMIN / CREATININE URINE RATIO
Creatinine, Urine: 35 mg/dL (ref 20–275)
Microalb Creat Ratio: 6 mcg/mg creat (ref <30)
Microalb, Ur: 0.2 mg/dL

## 2022-05-18 LAB — BASIC METABOLIC PANEL WITH GFR
BUN/Creatinine Ratio: 14 (calc) (ref 6–22)
BUN: 15 mg/dL (ref 7–25)
CO2: 25 mmol/L (ref 20–32)
Calcium: 9.3 mg/dL (ref 8.6–10.4)
Chloride: 107 mmol/L (ref 98–110)
Creat: 1.11 mg/dL — ABNORMAL HIGH (ref 0.60–0.95)
Glucose, Bld: 77 mg/dL (ref 65–99)
Potassium: 4.8 mmol/L (ref 3.5–5.3)
Sodium: 142 mmol/L (ref 135–146)
eGFR: 49 mL/min/{1.73_m2} — ABNORMAL LOW (ref >=60)

## 2022-05-18 NOTE — Progress Notes (Signed)
Hi Tina Medina, kidney function is stable.  Electrolytes are normal.  No excess protein in the urine which is good.  Do encourage you to consider getting your shingles vaccine at the pharmacy.

## 2022-05-23 ENCOUNTER — Ambulatory Visit: Payer: Medicare HMO | Admitting: Sports Medicine

## 2022-05-24 ENCOUNTER — Encounter: Payer: Self-pay | Admitting: Family Medicine

## 2022-05-24 ENCOUNTER — Encounter: Payer: Self-pay | Admitting: Physician Assistant

## 2022-05-24 DIAGNOSIS — L280 Lichen simplex chronicus: Secondary | ICD-10-CM | POA: Insufficient documentation

## 2022-05-24 MED ORDER — CLOBETASOL PROPIONATE 0.05 % EX CREA: 1.0000 | TOPICAL_CREAM | Freq: Two times a day (BID) | CUTANEOUS | 1 refills | Status: DC

## 2022-05-24 NOTE — Progress Notes (Signed)
Biopsy showed lichen simplex which is a skin disease based on inflammation and/or chronic scratching. Topical steroids can help. Have you tried any?

## 2022-05-31 ENCOUNTER — Other Ambulatory Visit: Payer: Self-pay | Admitting: Family Medicine

## 2022-05-31 DIAGNOSIS — I10 Essential (primary) hypertension: Secondary | ICD-10-CM

## 2022-06-29 NOTE — Progress Notes (Unsigned)
   Acute Office Visit  Subjective:     Patient ID: Tina Medina, female    DOB: [birth year], 81 y.o.   MRN: [id number]  No chief complaint on file.   Pt presents with head and neck pain.    Patient is in today for ***  ROS      Objective:    There were no vitals taken for this visit. {Vitals History (Optional):23777}  Physical Exam  No results found for any visits on 06/30/22.      Assessment & Plan:   Problem List Items Addressed This Visit   None   No orders of the defined types were placed in this encounter.   No follow-ups on file.  Owens Loffler, DO

## 2022-06-30 ENCOUNTER — Ambulatory Visit (INDEPENDENT_AMBULATORY_CARE_PROVIDER_SITE_OTHER): Payer: Medicare HMO

## 2022-06-30 ENCOUNTER — Ambulatory Visit (INDEPENDENT_AMBULATORY_CARE_PROVIDER_SITE_OTHER): Payer: Medicare HMO | Admitting: Family Medicine

## 2022-06-30 ENCOUNTER — Encounter: Payer: Self-pay | Admitting: Family Medicine

## 2022-06-30 VITALS — BP 202/79 | HR 52 | Ht 64.0 in | Wt 152.0 lb

## 2022-06-30 DIAGNOSIS — M542 Cervicalgia: Secondary | ICD-10-CM

## 2022-06-30 DIAGNOSIS — I10 Essential (primary) hypertension: Secondary | ICD-10-CM | POA: Diagnosis not present

## 2022-06-30 DIAGNOSIS — G8929 Other chronic pain: Secondary | ICD-10-CM

## 2022-06-30 MED ORDER — TRIAMCINOLONE ACETONIDE 0.1 % EX OINT: 1.0000 | TOPICAL_OINTMENT | Freq: Two times a day (BID) | CUTANEOUS | 1 refills | Status: DC

## 2022-06-30 MED ORDER — HYDROCHLOROTHIAZIDE 12.5 MG PO CAPS: 12.5000 mg | ORAL_CAPSULE | Freq: Every day | ORAL | 1 refills | Status: DC

## 2022-06-30 MED ORDER — NEBIVOLOL HCL 20 MG PO TABS: 1.0000 | ORAL_TABLET | Freq: Two times a day (BID) | ORAL | 1 refills | Status: DC

## 2022-06-30 NOTE — Assessment & Plan Note (Signed)
-   BP elevated during today's visit which can be a result of the neck pain she is having. She has not taken anything for pain as she does not like to take medicine regularly.  - she needed refills on her hctz 12.'5mg'$  and nebivolol hcl '20mg'$   -recheck shows elevated pressure - instructed pt to follow up in two weeks for blood pressure check and advised her to start taking her tylenol for pain

## 2022-06-30 NOTE — Assessment & Plan Note (Addendum)
-   sending pt for cervical xray since pt is 81yo with hx of stenosis and her reported history has concern for worsening.  - pending xray may need to send to spine if progression has occurred  - negative spurlings test bilaterally - advised pt to use tylenol for pain management

## 2022-07-03 ENCOUNTER — Other Ambulatory Visit: Payer: Self-pay | Admitting: Family Medicine

## 2022-07-03 DIAGNOSIS — G8929 Other chronic pain: Secondary | ICD-10-CM

## 2022-07-19 ENCOUNTER — Ambulatory Visit (INDEPENDENT_AMBULATORY_CARE_PROVIDER_SITE_OTHER): Payer: Medicare HMO | Admitting: Family Medicine

## 2022-07-19 ENCOUNTER — Encounter: Payer: Self-pay | Admitting: Family Medicine

## 2022-07-19 VITALS — BP 165/71 | HR 54 | Ht 64.0 in | Wt 155.0 lb

## 2022-07-19 DIAGNOSIS — M545 Low back pain, unspecified: Secondary | ICD-10-CM | POA: Diagnosis not present

## 2022-07-19 DIAGNOSIS — I10 Essential (primary) hypertension: Secondary | ICD-10-CM | POA: Diagnosis not present

## 2022-07-19 DIAGNOSIS — G8929 Other chronic pain: Secondary | ICD-10-CM

## 2022-07-19 DIAGNOSIS — H539 Unspecified visual disturbance: Secondary | ICD-10-CM | POA: Diagnosis not present

## 2022-07-19 DIAGNOSIS — L280 Lichen simplex chronicus: Secondary | ICD-10-CM | POA: Diagnosis not present

## 2022-07-19 LAB — POCT GLYCOSYLATED HEMOGLOBIN (HGB A1C): HbA1c POC (<> result, manual entry): 5.4 % (ref 4.0–5.6)

## 2022-07-19 MED ORDER — CLOBETASOL PROPIONATE 0.05 % EX OINT: 1.0000 | TOPICAL_OINTMENT | Freq: Every day | CUTANEOUS | 0 refills | Status: DC

## 2022-07-19 NOTE — Progress Notes (Signed)
Established Patient Office Visit  Subjective   Patient ID: Tina Medina, female    DOB: 06-22-1941  Age: 81 y.o. MRN: [id number]  Chief Complaint  Patient presents with   Follow-up    htn    HPI  F/U HTN - here for f/u of elevated BP. SBP was 200 about 2 weeks ago.  She is taking her HCTZ and her nebivolol regularly.  She unfortunately has had chronically high blood pressure she has multiple drug intolerances and we have referred her to cardiology a couple different times to assist [place] with BP control.  She says the skin lesion on the back of her neck still is not healing.  She did have a skin biopsy performed which just showed lichen simplex.  She has been treated with triamcinolone.  Occasion is not helping.  I am going to try switching her to clobetasol and then refer her to dermatology for further work-up.  He says most of the time its not bothersome unless it gets touched or irritated.  Says she woke up this morning noticing that both eyes look swollen.  She is not having any itching or irritation.  She reports intermittent vision changes but this has been ongoing.  She says she has 3 different prescription lenses and depending on the day we will have to put a different pair on    ROS    Objective:     BP (!) 165/71   Pulse (!) 54   Ht '5\' 4"'$  (1.626 m)   Wt 155 lb (70.3 kg)   SpO2 99%   BMI 26.61 kg/m    Physical Exam Vitals and nursing note reviewed.  Constitutional:      Appearance: She is well-developed.  HENT:     Head: Normocephalic and atraumatic.  Eyes:     Comments: She has puffiness under both eyes  bilaterally.  Cardiovascular:     Rate and Rhythm: Normal rate and regular rhythm.     Heart sounds: Normal heart sounds.  Pulmonary:     Effort: Pulmonary effort is normal.     Breath sounds: Normal breath sounds.  Skin:    General: Skin is warm and dry.  Neurological:     Mental Status: She is alert and oriented to person, place, and time.   Psychiatric:        Behavior: Behavior normal.      Results for orders placed or performed in visit on 07/19/22  POCT HgB A1C  Result Value Ref Range   Hemoglobin A1C     HbA1c POC (<> result, manual entry) 5.4 4.0 - 5.6 %   HbA1c, POC (prediabetic range)     HbA1c, POC (controlled diabetic range)        The ASCVD Risk score (Arnett DK, et al., 2019) failed to calculate for the following reasons:   The 2019 ASCVD risk score is only valid for ages 3 to 38    Assessment & Plan:   Problem List Items Addressed This Visit       Cardiovascular and Mediastinum   ESSENTIAL HYPERTENSION, BENIGN    BP about 40 points lower than when she was here about a week ago.  She unfortunately continues to have persistently elevated blood pressures even after multiple consults with cardiology and unfortunately does not tolerate multiple medications.  We will continue with her current regimen for now.      Relevant Orders   COMPLETE METABOLIC PANEL WITH GFR   CBC with  Differential/Platelet     Musculoskeletal and Integument   Lichen simplex - Primary    Would probably benefit from a stronger steroid such as clobetasol in place of the triamcinolone but unfortunately cost was an issue.  We will plan for referral to dermatology.      Relevant Medications   clobetasol ointment (TEMOVATE) 0.05 %   Other Relevant Orders   Ambulatory referral to Dermatology   Other Visit Diagnoses     Vision changes       Relevant Orders   COMPLETE METABOLIC PANEL WITH GFR   CBC with Differential/Platelet   POCT HgB A1C (Completed)   Chronic bilateral low back pain without sciatica           Vision change-did encourage her to speak with her eye doctor about this.  She does have an appointment coming up in October and let them know that she is having some fluctuations.  We did go ahead and check an A1c today to rule out diabetes.  A1c looks great at 5.4.  Return in about 3 months (around 10/19/2022)  for Hypertension.    Beatrice Lecher, MD

## 2022-07-19 NOTE — Assessment & Plan Note (Signed)
BP about 40 points lower than when she was here about a week ago.  She unfortunately continues to have persistently elevated blood pressures even after multiple consults with cardiology and unfortunately does not tolerate multiple medications.  We will continue with her current regimen for now.

## 2022-07-19 NOTE — Patient Instructions (Signed)
The stronger medication to try while waiting to get you in with dermatology.

## 2022-07-19 NOTE — Assessment & Plan Note (Signed)
Would probably benefit from a stronger steroid such as clobetasol in place of the triamcinolone but unfortunately cost was an issue.  We will plan for referral to dermatology.

## 2022-07-20 LAB — COMPLETE METABOLIC PANEL WITH GFR
AG Ratio: 1.4 (calc) (ref 1.0–2.5)
ALT: 8 U/L (ref 6–29)
AST: 17 U/L (ref 10–35)
Albumin: 3.9 g/dL (ref 3.6–5.1)
Alkaline phosphatase (APISO): 59 U/L (ref 37–153)
BUN/Creatinine Ratio: 14 (calc) (ref 6–22)
BUN: 15 mg/dL (ref 7–25)
CO2: 29 mmol/L (ref 20–32)
Calcium: 9.6 mg/dL (ref 8.6–10.4)
Chloride: 107 mmol/L (ref 98–110)
Creat: 1.1 mg/dL — ABNORMAL HIGH (ref 0.60–0.95)
Globulin: 2.7 g/dL (calc) (ref 1.9–3.7)
Glucose, Bld: 78 mg/dL (ref 65–99)
Potassium: 4.9 mmol/L (ref 3.5–5.3)
Sodium: 143 mmol/L (ref 135–146)
Total Bilirubin: 0.3 mg/dL (ref 0.2–1.2)
Total Protein: 6.6 g/dL (ref 6.1–8.1)
eGFR: 50 mL/min/{1.73_m2} — ABNORMAL LOW (ref >=60)

## 2022-07-20 LAB — CBC WITH DIFFERENTIAL/PLATELET
Absolute Monocytes: 642 cells/uL (ref 200–950)
Basophils Absolute: 29 cells/uL (ref 0–200)
Basophils Relative: 0.4 %
Eosinophils Absolute: 161 cells/uL (ref 15–500)
Eosinophils Relative: 2.2 %
HCT: 36.5 % (ref 35.0–45.0)
Hemoglobin: 12.5 g/dL (ref 11.7–15.5)
Lymphs Abs: 2394 cells/uL (ref 850–3900)
MCH: 31.8 pg (ref 27.0–33.0)
MCHC: 34.2 g/dL (ref 32.0–36.0)
MCV: 92.9 fL (ref 80.0–100.0)
MPV: 11.4 fL (ref 7.5–12.5)
Monocytes Relative: 8.8 %
Neutro Abs: 4073 cells/uL (ref 1500–7800)
Neutrophils Relative %: 55.8 %
Platelets: 257 10*3/uL (ref 140–400)
RBC: 3.93 10*6/uL (ref 3.80–5.10)
RDW: 12.3 % (ref 11.0–15.0)
Total Lymphocyte: 32.8 %
WBC: 7.3 10*3/uL (ref 3.8–10.8)

## 2022-07-20 NOTE — Progress Notes (Signed)
Your lab work is within acceptable range and there are no concerning findings.   ?

## 2022-08-09 DIAGNOSIS — M5416 Radiculopathy, lumbar region: Secondary | ICD-10-CM | POA: Diagnosis not present

## 2022-08-09 DIAGNOSIS — M542 Cervicalgia: Secondary | ICD-10-CM | POA: Diagnosis not present

## 2022-08-17 ENCOUNTER — Telehealth: Payer: Self-pay | Admitting: *Deleted

## 2022-08-17 ENCOUNTER — Other Ambulatory Visit: Payer: Self-pay | Admitting: Neurological Surgery

## 2022-08-17 DIAGNOSIS — M5416 Radiculopathy, lumbar region: Secondary | ICD-10-CM

## 2022-08-17 MED ORDER — LORAZEPAM 1 MG PO TABS: 1.0000 mg | ORAL_TABLET | Freq: Once | ORAL | 0 refills | Status: AC

## 2022-08-17 NOTE — Telephone Encounter (Signed)
Meds ordered this encounter  Medications   LORazepam (ATIVAN) 1 MG tablet    Sig: Take 1 tablet (1 mg total) by mouth once for 1 dose. About 30 min before procedure    Dispense:  2 tablet    Refill:  0   Just remind her that she has to have someone drive her home.

## 2022-08-17 NOTE — Telephone Encounter (Addendum)
Pt called and stated that she is scheduled for and MRI on Monday and would like for Dr. Madilyn Fireman to send something in to keep her calm during her procedure.   Pt asked that this be sent to [facility].

## 2022-08-19 ENCOUNTER — Ambulatory Visit (INDEPENDENT_AMBULATORY_CARE_PROVIDER_SITE_OTHER): Payer: Medicare HMO | Admitting: Family Medicine

## 2022-08-19 DIAGNOSIS — Z0000 Encounter for general adult medical examination without abnormal findings: Secondary | ICD-10-CM

## 2022-08-19 NOTE — Progress Notes (Signed)
MEDICARE ANNUAL WELLNESS VISIT  08/19/2022  Telephone Visit Disclaimer This Medicare AWV was conducted by telephone due to national recommendations for restrictions regarding the COVID-19 Pandemic (e.g. social distancing).  I verified, using two identifiers, that I am speaking with Tina Medina or their authorized healthcare agent. I discussed the limitations, risks, security, and privacy concerns of performing an evaluation and management service by telephone and the potential availability of an in-person appointment in the future. The patient expressed understanding and agreed to proceed.  Location of Patient: Home Location of Provider (nurse):  In the office.  Subjective:    Tina Medina is a 81 y.o. female patient of Metheney, Rene Kocher, MD who had a Medicare Annual Wellness Visit today via telephone. [name] is Retired and lives alone. she had 3 children but one has deceased. she reports that she is socially active and does interact with friends/family regularly. she is minimally physically active and enjoys doing to church.  Patient Care Team: Hali Marry, MD as PCP - General (Family Medicine)     08/19/2022    9:03 AM 08/13/2021   10:11 AM 07/01/2019    3:08 PM 12/19/2014    9:27 AM  Advanced Directives  Does Patient Have a Medical Advance Directive? Yes Yes Yes Yes  Type of Advance Directive Living will Living will;Healthcare Power of [place];Living will [place]  Does patient want to make changes to medical advance directive? No - Patient declined No - Patient declined No - Patient declined No - Patient declined  Copy of [place] in Chart?  No - copy requested No - copy requested Yes    Hospital Utilization Over the Past 12 Months: # of hospitalizations or ER visits: 0 # of surgeries: 0  Review of Systems    Patient reports that her overall health is worse compared to last year.  History  obtained from chart review and the patient  Patient Reported Readings (BP, Pulse, CBG, Weight, etc) none  Pain Assessment Pain : No/denies pain     Current Medications & Allergies (verified) Allergies as of 08/19/2022       Reactions   Ace Inhibitors    REACTION: cough Other reaction(s): Abdominal Pain, Chest Pain Patient develops cough, headaches   Atenolol    Benicar [olmesartan Medoxomil] Other (See Comments)   Chest pain   Cardura [doxazosin] Diarrhea   Carvedilol Diarrhea   Complains of diarrhea and chest pain.    Clonidine Derivatives Other (See Comments)   Diltiazem Other (See Comments)   Sharp head ache.    Hydralazine Other (See Comments)   "Chills"    Losartan Other (See Comments)   headache   Metoprolol Diarrhea   Abdominal cramping.    Micardis [telmisartan] Other (See Comments)   Chest Pain, Back Pain.    Penicillins    REACTION: rash   Verapamil Other (See Comments)   Headaches and constipation   Amlodipine Rash   And Headaches        Medication List        Accurate as of August 19, 2022  9:22 AM. If you have any questions, ask your nurse or doctor.          acetaminophen 650 MG CR tablet Commonly known as: TYLENOL Take 1 tablet (650 mg total) by mouth every 8 (eight) hours as needed for pain.   aspirin EC 81 MG tablet Take by mouth.   clobetasol  ointment 0.05 % Commonly known as: TEMOVATE Apply 1 Application topically at bedtime. X 2-3 weeks.   hydrochlorothiazide 12.5 MG capsule Commonly known as: Microzide Take 1 capsule (12.5 mg total) by mouth daily.   LORazepam 1 MG tablet Commonly known as: ATIVAN Take 1 mg by mouth daily as needed. For mri   Nebivolol HCl 20 MG Tabs Take 1 tablet (20 mg total) by mouth 2 (two) times daily.   rosuvastatin 5 MG tablet Commonly known as: CRESTOR Take 1 tablet (5 mg total) by mouth at bedtime.        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   Menopause    since age 78   Migraine    Osteoma    2 mm of the L lid ethmoid air cells   Thyroid nodule    Past Surgical History:  Procedure Laterality Date   BREAST BIOPSY Left    BREAST EXCISIONAL BIOPSY Left    BREAST SURGERY  1969   Left   TUBAL LIGATION     Family History  Problem Relation Age of Onset   Hyperlipidemia Mother    Hypertension Mother    Diabetes Mother    Arthritis Mother    Prostate cancer Father    Hypertension Brother    Cancer Brother    Social History   Socioeconomic History   Marital status: Widowed    Spouse name: Not on file   Number of children: 3   Years of education: 31   Highest education level: Some college, no degree  Occupational History   Occupation: Retired.      Employer: [employer]    Comment: Retired  Tobacco Use   Smoking status: Former    Packs/day: 0.00    Types: Cigarettes    Quit date: 12/05/1968    Years since quitting: 53.7   Smokeless tobacco: Former    Quit date: 12/05/1962   Tobacco comments:    light smoker  Vaping Use   Vaping Use: Never used  Substance and Sexual Activity   Alcohol use: Not Currently    Comment: Occasional glass of wine   Drug use: No   Sexual activity: Not Currently  Other Topics Concern   Not on file  Social History Narrative   Lives alone. She had three children, two living. Her daughters live in [place]. She enjoys decorating in her free time. She does stretching exercises for ten mins everyday. Enjoys going to church.   Social Determinants of Health   Financial Resource Strain: Low Risk  (08/15/2022)   Overall Financial Resource Strain (CARDIA)    Difficulty of Paying Living Expenses: Not hard at all  Food  Insecurity: No Food Insecurity (08/15/2022)   Hunger Vital Sign    Worried About Running Out of Food in the Last Year: Never true    Ran Out of Food in the Last Year: Never true  Transportation Needs: No Transportation Needs (08/15/2022)   PRAPARE - Hydrologist (Medical): No    Lack of Transportation (Non-Medical): No  Physical Activity: Inactive (08/15/2022)   Exercise Vital Sign  Days of Exercise per Week: 0 days    Minutes of Exercise per Session: 0 min  Stress: Stress Concern Present (08/15/2022)   [place]    Feeling of Stress : To some extent  Social Connections: Moderately Integrated (08/19/2022)   Social Connection and Isolation Panel [NHANES]    Frequency of Communication with Friends and Family: Three times a week    Frequency of Social Gatherings with Friends and Family: Twice a week    Attends Religious Services: More than 4 times per year    Active Member of [employer] or Organizations: Yes    Attends Archivist Meetings: More than 4 times per year    Marital Status: Widowed    Activities of Daily Living    08/15/2022    5:53 PM  In your present state of health, do you have any difficulty performing the following activities:  Hearing? 0  Vision? 1  Difficulty concentrating or making decisions? 0  Walking or climbing stairs? 1  Dressing or bathing? 1  Doing errands, shopping? 0  Preparing Food and eating ? N  Using the Toilet? N  In the past six months, have you accidently leaked urine? Y  Do you have problems with loss of bowel control? N  Managing your Medications? N  Managing your Finances? Y  Housekeeping or managing your Housekeeping? Y    Patient Education/ Literacy How often do you need to have someone help you when you read instructions, pamphlets, or other written materials from your doctor or pharmacy?: 1 - Never What is the last grade level you  completed in school?: one year of college  Exercise Current Exercise Habits: The patient does not participate in regular exercise at present, Exercise limited by: orthopedic condition(s) (unable to stand for long period of time.)  Diet Patient reports consuming  2-3  meals a day and 2 snack(s) a day Patient reports that her primary diet is: Regular Patient reports that she does have regular access to food.   Depression Screen    08/19/2022    9:04 AM 07/19/2022    2:37 PM 02/16/2022   10:40 AM 01/24/2022    9:00 AM 12/09/2021    9:11 AM 10/05/2021   11:02 AM 08/13/2021   10:15 AM  PHQ 2/9 Scores  PHQ - 2 Score 2 2 0 0 1 2 0  PHQ- 9 Score 9     8      Fall Risk    08/19/2022    9:04 AM 08/15/2022    5:53 PM 02/16/2022   10:40 AM 01/24/2022    8:59 AM 12/09/2021    9:10 AM  Fall Risk   Falls in the past year? 0 0 0 0 0  Number falls in past yr: 0  0 0 0  Injury with Fall? 0  0 0 0  Risk for fall due to : No Fall Risks  No Fall Risks No Fall Risks No Fall Risks  Follow up Falls evaluation completed;Education provided;Falls prevention discussed  Falls evaluation completed Falls prevention discussed;Falls evaluation completed Falls prevention discussed;Falls evaluation completed     Objective:  ANDRA HESLIN seemed alert and oriented and she participated appropriately during our telephone visit.  Blood Pressure Weight BMI  BP Readings from Last 3 Encounters:  07/19/22 (!) 165/71  06/30/22 (!) 202/79  05/17/22 (!) 172/76   Wt Readings from Last 3 Encounters:  07/19/22 155 lb (70.3 kg)  06/30/22 152 lb (68.9 kg)  05/17/22 156 lb (70.8 kg)   BMI Readings from Last 1 Encounters:  07/19/22 26.61 kg/m    *Unable to obtain current vital signs, weight, and BMI due to telephone visit type  Hearing/Vision  Tina Medina did not seem to have difficulty with hearing/understanding during the telephone conversation Reports that she has had a formal eye exam by an eye care professional within the  past year Reports that she has not had a formal hearing evaluation within the past year *Unable to fully assess hearing and vision during telephone visit type  Cognitive Function:    08/19/2022    9:14 AM 08/13/2021   10:30 AM 07/01/2019    3:12 PM  6CIT Screen  What Year? 0 points 0 points 0 points  What month? 0 points 0 points 0 points  What time? 0 points 0 points 0 points  Count back from 20 0 points 0 points 0 points  Months in reverse 0 points 0 points 0 points  Repeat phrase 0 points 0 points 4 points  Total Score 0 points 0 points 4 points   (Normal:0-7, Significant for Dysfunction: >8)  Normal Cognitive Function Screening: Yes   Immunization & Health Maintenance Record Immunization History  Administered Date(s) Administered   Fluad Quad(high Dose 65+) 08/07/2019, 09/10/2020, 08/04/2021   Influenza Split 08/20/2012   Influenza Whole 10/16/2009, 08/12/2011   Influenza, High Dose Seasonal PF 08/30/2018   Influenza,inj,Quad PF,6+ Mos 09/30/2013, 07/28/2014   Moderna Sars-Covid-2 Vaccination 02/06/2020, 03/05/2020, 09/28/2020   Pneumococcal Conjugate-13 05/12/2015   Pneumococcal Polysaccharide-23 02/26/2008   Td 12/05/2004   Zoster, Live 11/05/2013    Health Maintenance  Topic Date Due   COVID-19 Vaccine (4 - Moderna series) 09/04/2022 (Originally 11/23/2020)   Zoster Vaccines- Shingrix (1 of 2) 11/18/2022 (Originally 01/10/1989)   INFLUENZA VACCINE  03/05/2023 (Originally 07/05/2022)   TETANUS/TDAP  08/20/2023 (Originally 12/05/2014)   Pneumonia Vaccine 41+ Years old  Completed   DEXA SCAN  Completed   HPV VACCINES  Aged Out       Assessment  This is a routine wellness examination for Tina Medina.  Health Maintenance: Due or Overdue There are no preventive care reminders to display for this patient.   Tina Medina does not need a referral for Community Assistance: Care Management:   no Social Work:    no Prescription Assistance:  no Nutrition/Diabetes  Education:  no   Plan:  Personalized Goals  Goals Addressed               This Visit's Progress     Patient Stated (pt-stated)        Patient would like to be free of her back pain. She has an MRI scheduled.       Personalized Health Maintenance & Screening Recommendations  Td vaccine Shingrix vaccine  Lung Cancer Screening Recommended: no (Low Dose CT Chest recommended if Age 83-80 years, 30 pack-year currently smoking OR have quit w/in past 15 years) Hepatitis C Screening recommended: no HIV Screening recommended: no  Advanced Directives: Written information was not prepared per patient's request.  Referrals & Orders No orders of the defined types were placed in this encounter.   Follow-up Plan Follow-up with Hali Marry, MD as planned Schedule nurse visit for flu shot.  Medicare wellness visit in one year. Patient will access AVS on my chart.   I have personally reviewed and noted the following in the patient's chart:   Medical and social  history Use of alcohol, tobacco or illicit drugs  Current medications and supplements Functional ability and status Nutritional status Physical activity Advanced directives List of other physicians Hospitalizations, surgeries, and ER visits in previous 12 months Vitals Screenings to include cognitive, depression, and falls Referrals and appointments  In addition, I have reviewed and discussed with Tina Medina certain preventive protocols, quality metrics, and best practice recommendations. A written personalized care plan for preventive services as well as general preventive health recommendations is available and can be mailed to the patient at her request.      Tinnie Gens, RN BSN  08/19/2022

## 2022-08-19 NOTE — Patient Instructions (Addendum)
[place] Maintenance Summary and Written Plan of Care  [name] ,  Thank you for allowing me to perform your Medicare Annual Wellness Visit and for your ongoing commitment to your health.   Health Maintenance & Immunization History Health Maintenance  Topic Date Due   COVID-19 Vaccine (4 - Moderna series) 09/04/2022 (Originally 11/23/2020)   Zoster Vaccines- Shingrix (1 of 2) 11/18/2022 (Originally 01[birth year]10/1989)   INFLUENZA VACCINE  03/05/2023 (Originally 07/05/2022)   TETANUS/TDAP  08/20/2023 (Originally 12/05/2014)   Pneumonia Vaccine 3+ Years old  Completed   DEXA SCAN  Completed   HPV VACCINES  Aged Out   Immunization History  Administered Date(s) Administered   Fluad Quad(high Dose 65+) 08/07/2019, 09/10/2020, 08/04/2021   Influenza Split 08/20/2012   Influenza Whole 10/16/2009, 08/12/2011   Influenza, High Dose Seasonal PF 08/30/2018   Influenza,inj,Quad PF,6+ Mos 09/30/2013, 07/28/2014   Moderna Sars-Covid-2 Vaccination 02/06/2020, 03/05/2020, 09/28/2020   Pneumococcal Conjugate-13 05/12/2015   Pneumococcal Polysaccharide-23 02/26/2008   Td 12/05/2004   Zoster, Live 11/05/2013    These are the patient goals that we discussed:  Goals Addressed               This Visit's Progress     Patient Stated (pt-stated)        Patient would like to be free of her back pain. She has an MRI scheduled.         This is a list of Health Maintenance Items that are overdue or due now: Td vaccine Shingrix vaccine  Orders/Referrals Placed Today: No orders of the defined types were placed in this encounter.  (Contact our referral department at [phone] if you have not spoken with someone about your referral appointment within the next 5 days)    Follow-up Plan Follow-up with Tina Marry, MD as planned Schedule nurse visit for flu shot.  Medicare wellness visit in one year. Patient will access AVS on my chart.      Health  Maintenance, Female Adopting a healthy lifestyle and getting preventive care are important in promoting health and wellness. Ask your health care provider about: The right schedule for you to have regular tests and exams. Things you can do on your own to prevent diseases and keep yourself healthy. What should I know about diet, weight, and exercise? Eat a healthy diet  Eat a diet that includes plenty of vegetables, fruits, low-fat dairy products, and lean protein. Do not eat a lot of foods that are high in solid fats, added sugars, or sodium. Maintain a healthy weight Body mass index (BMI) is used to identify weight problems. It estimates body fat based on height and weight. Your health care provider can help determine your BMI and help you achieve or maintain a healthy weight. Get regular exercise Get regular exercise. This is one of the most important things you can do for your health. Most adults should: Exercise for at least 150 minutes each week. The exercise should increase your heart rate and make you sweat (moderate-intensity exercise). Do strengthening exercises at least twice a week. This is in addition to the moderate-intensity exercise. Spend less time sitting. Even light physical activity can be beneficial. Watch cholesterol and blood lipids Have your blood tested for lipids and cholesterol at 83 years of age, then have this test every 5 years. Have your cholesterol levels checked more often if: Your lipid or cholesterol levels are high. You are older than 83 years of age. You are at  high risk for heart disease. What should I know about cancer screening? Depending on your health history and family history, you may need to have cancer screening at various ages. This may include screening for: Breast cancer. Cervical cancer. Colorectal cancer. Skin cancer. Lung cancer. What should I know about heart disease, diabetes, and high blood pressure? Blood pressure and heart  disease High blood pressure causes heart disease and increases the risk of stroke. This is more likely to develop in people who have high blood pressure readings or are overweight. Have your blood pressure checked: Every 3-5 years if you are 40-57 years of age. Every year if you are 86 years old or older. Diabetes Have regular diabetes screenings. This checks your fasting blood sugar level. Have the screening done: Once every three years after age 21 if you are at a normal weight and have a low risk for diabetes. More often and at a younger age if you are overweight or have a high risk for diabetes. What should I know about preventing infection? Hepatitis B If you have a higher risk for hepatitis B, you should be screened for this virus. Talk with your health care provider to find out if you are at risk for hepatitis B infection. Hepatitis C Testing is recommended for: Everyone born from [birth year] through 1967. Anyone with known risk factors for hepatitis C. Sexually transmitted infections (STIs) Get screened for STIs, including gonorrhea and chlamydia, if: You are sexually active and are younger than 83 years of age. You are older than 83 years of age and your health care provider tells you that you are at risk for this type of infection. Your sexual activity has changed since you were last screened, and you are at increased risk for chlamydia or gonorrhea. Ask your health care provider if you are at risk. Ask your health care provider about whether you are at high risk for HIV. Your health care provider may recommend a prescription medicine to help prevent HIV infection. If you choose to take medicine to prevent HIV, you should first get tested for HIV. You should then be tested every 3 months for as long as you are taking the medicine. Pregnancy If you are about to stop having your period (premenopausal) and you may become pregnant, seek counseling before you get pregnant. Take 400 to 800  micrograms (mcg) of folic acid every day if you become pregnant. Ask for birth control (contraception) if you want to prevent pregnancy. Osteoporosis and menopause Osteoporosis is a disease in which the bones lose minerals and strength with aging. This can result in bone fractures. If you are 39 years old or older, or if you are at risk for osteoporosis and fractures, ask your health care provider if you should: Be screened for bone loss. Take a calcium or vitamin D supplement to lower your risk of fractures. Be given hormone replacement therapy (HRT) to treat symptoms of menopause. Follow these instructions at home: Alcohol use Do not drink alcohol if: Your health care provider tells you not to drink. You are pregnant, may be pregnant, or are planning to become pregnant. If you drink alcohol: Limit how much you have to: 0-1 drink a day. Know how much alcohol is in your drink. In the [place] one drink equals one 12 oz bottle of beer (355 mL), one 5 oz glass of wine (148 mL), or one 1 oz glass of hard liquor (44 mL). Lifestyle Do not use any products that contain nicotine or  tobacco. These products include cigarettes, chewing tobacco, and vaping devices, such as e-cigarettes. If you need help quitting, ask your health care provider. Do not use street drugs. Do not share needles. Ask your health care provider for help if you need support or information about quitting drugs. General instructions Schedule regular health, dental, and eye exams. Stay current with your vaccines. Tell your health care provider if: You often feel depressed. You have ever been abused or do not feel safe at home. Summary Adopting a healthy lifestyle and getting preventive care are important in promoting health and wellness. Follow your health care provider's instructions about healthy diet, exercising, and getting tested or screened for diseases. Follow your health care provider's instructions on monitoring your  cholesterol and blood pressure. This information is not intended to replace advice given to you by your health care provider. Make sure you discuss any questions you have with your health care provider. Document Revised: 04/12/2021 Document Reviewed: 04/12/2021 Elsevier Patient Education  [place].

## 2022-08-19 NOTE — Telephone Encounter (Signed)
LVM informing pt of RX and to have someone to drive her.  Charyl Bigger, CMA

## 2022-08-22 ENCOUNTER — Ambulatory Visit (INDEPENDENT_AMBULATORY_CARE_PROVIDER_SITE_OTHER): Payer: Medicare HMO

## 2022-08-22 DIAGNOSIS — M5416 Radiculopathy, lumbar region: Secondary | ICD-10-CM

## 2022-08-22 DIAGNOSIS — G8929 Other chronic pain: Secondary | ICD-10-CM

## 2022-08-22 DIAGNOSIS — M545 Low back pain, unspecified: Secondary | ICD-10-CM

## 2022-08-22 DIAGNOSIS — M4316 Spondylolisthesis, lumbar region: Secondary | ICD-10-CM | POA: Diagnosis not present

## 2022-08-22 DIAGNOSIS — M48061 Spinal stenosis, lumbar region without neurogenic claudication: Secondary | ICD-10-CM | POA: Diagnosis not present

## 2022-08-24 DIAGNOSIS — L300 Nummular dermatitis: Secondary | ICD-10-CM | POA: Diagnosis not present

## 2022-08-24 DIAGNOSIS — L280 Lichen simplex chronicus: Secondary | ICD-10-CM | POA: Diagnosis not present

## 2022-09-13 DIAGNOSIS — Z6826 Body mass index (BMI) 26.0-26.9, adult: Secondary | ICD-10-CM | POA: Diagnosis not present

## 2022-09-13 DIAGNOSIS — M5416 Radiculopathy, lumbar region: Secondary | ICD-10-CM | POA: Diagnosis not present

## 2022-09-13 DIAGNOSIS — R6889 Other general symptoms and signs: Secondary | ICD-10-CM | POA: Diagnosis not present

## 2022-10-06 ENCOUNTER — Ambulatory Visit (INDEPENDENT_AMBULATORY_CARE_PROVIDER_SITE_OTHER): Payer: Medicare HMO | Admitting: Family Medicine

## 2022-10-06 ENCOUNTER — Encounter: Payer: Self-pay | Admitting: Family Medicine

## 2022-10-06 VITALS — BP 190/73 | HR 58 | Ht 64.0 in | Wt 153.0 lb

## 2022-10-06 DIAGNOSIS — G4489 Other headache syndrome: Secondary | ICD-10-CM

## 2022-10-06 DIAGNOSIS — I10 Essential (primary) hypertension: Secondary | ICD-10-CM | POA: Diagnosis not present

## 2022-10-06 DIAGNOSIS — H539 Unspecified visual disturbance: Secondary | ICD-10-CM | POA: Diagnosis not present

## 2022-10-06 DIAGNOSIS — D320 Benign neoplasm of cerebral meninges: Secondary | ICD-10-CM

## 2022-10-06 NOTE — Assessment & Plan Note (Signed)
-   upon chart review this seems to be a chronic problem for patient as she does wear multiple lenses depending on the day  - recommended and encouraged pt she present to her eye doctor soon to have a proper eye exam

## 2022-10-06 NOTE — Assessment & Plan Note (Addendum)
-   pt does not currently have a headache but says she gets intermittent shooting head pains - she denies a headache on exam currently. CN exam was intact. Normal gait  - have sent referral to neurology to see if we can figure out intermittent headaches and if there is any correlation with her meningioma.

## 2022-10-06 NOTE — Assessment & Plan Note (Addendum)
-   BP elevated to 190s SBPs some concern for hypertensive urgency vs emergency  - will obtain labs for signs of end organ damage: CMP, UA  - pt does have a history of elevated blood pressure that is unfortunately unable to be controlled well due to multiple drug intolerances. She has been to multiple cardiologists in the past for this as well. - pt does admit to intermittent headaches. She says she does not have one today and says she doesn't have vision changes at the time of the exam.  - gave RTC precautions

## 2022-10-06 NOTE — Progress Notes (Signed)
Acute Office Visit  Subjective:     Patient ID: Tina Medina, female    DOB: 12-05-1941, 81 y.o.   MRN: [id number]  Chief Complaint  Patient presents with   Hypertension    HPI Patient is in today for persistent headaches and visual changes. She does have a hx of chronically high blood pressure with multiple drug intolerances. She has had visual changes in the past and has 3 different lens prescriptions depending on the day and will put a different pair on.   BP today is elevated at 190 SBPs. She notes significant shooting pains in the back of her head that were present a few days ago and have now resolved. She says when she walks outside she tends to move to the left. In the house she does not have this problem. She has not yet been to the eye doctor. 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   Review of Systems  Constitutional:  Negative for chills and fever.  Respiratory:  Negative for cough and shortness of breath.   Cardiovascular:  Negative for chest pain.  Neurological:  Positive for headaches.        Objective:    BP (!) 190/73   Pulse (!) 58   Ht '5\' 4"'$  (1.626 m)   Wt 153 lb (69.4 kg)   SpO2 100%   BMI 26.26 kg/m    Physical Exam Vitals and nursing note reviewed.  Constitutional:      General: She is not in acute distress.    Appearance: Normal appearance.  HENT:     Head: Normocephalic and atraumatic.     Right Ear: External ear normal.     Left Ear: External ear normal.     Nose: Nose normal.  Eyes:     Conjunctiva/sclera: Conjunctivae normal.  Cardiovascular:     Rate and Rhythm: Normal rate and regular rhythm.  Pulmonary:     Effort: Pulmonary effort is normal.     Breath sounds: Normal breath sounds.  Neurological:     General: No focal deficit present.     Mental Status: She is alert and oriented to person, place, and time.     Comments: CN II-XII intact bilaterally  No gait abnormalities noted  Psychiatric:         Mood and Affect: Mood normal.        Behavior: Behavior normal.        Thought Content: Thought content normal.        Judgment: Judgment normal.     No results found for any visits on 10/06/22.      Assessment & Plan:   Problem List Items Addressed This Visit       Cardiovascular and Mediastinum   Primary hypertension - Primary    - BP elevated to 190s SBPs some concern for hypertensive urgency vs emergency  - will obtain labs for signs of end organ damage: CMP, UA  - pt does have a history of elevated blood pressure that is unfortunately unable to be controlled well due to multiple drug intolerances. She has been to multiple cardiologists in the past for this as well. - pt does admit to intermittent headaches. She says she does not have one today and says she doesn't have vision changes at the time of the exam.  - gave RTC precautions      Relevant Orders   COMPLETE METABOLIC PANEL WITH GFR  Urinalysis, Routine w reflex microscopic     Nervous and Auditory   MENINGIOMA   Relevant Orders   Ambulatory referral to Neurology     Other   Other headache syndrome    - pt does not currently have a headache but says she gets intermittent shooting head pains - she denies a headache on exam currently. CN exam was intact. Normal gait  - have sent referral to neurology to see if we can figure out intermittent headaches and if there is any correlation with her meningioma.       Relevant Orders   Ambulatory referral to Neurology   Vision changes    - upon chart review this seems to be a chronic problem for patient as she does wear multiple lenses depending on the day  - recommended and encouraged pt she present to her eye doctor soon to have a proper eye exam      Relevant Orders   Ambulatory referral to Neurology    No orders of the defined types were placed in this encounter.   Return for as scheduled with PCP.  Owens Loffler, DO

## 2022-10-07 LAB — URINALYSIS, ROUTINE W REFLEX MICROSCOPIC
Bilirubin Urine: NEGATIVE
Glucose, UA: NEGATIVE
Hgb urine dipstick: NEGATIVE
Ketones, ur: NEGATIVE
Leukocytes,Ua: NEGATIVE
Nitrite: NEGATIVE
Protein, ur: NEGATIVE
Specific Gravity, Urine: 1.01 (ref 1.001–1.035)
pH: 6.5 (ref 5.0–8.0)

## 2022-10-07 LAB — COMPLETE METABOLIC PANEL WITH GFR
AG Ratio: 1.5 (calc) (ref 1.0–2.5)
ALT: 9 U/L (ref 6–29)
AST: 18 U/L (ref 10–35)
Albumin: 4 g/dL (ref 3.6–5.1)
Alkaline phosphatase (APISO): 71 U/L (ref 37–153)
BUN/Creatinine Ratio: 14 (calc) (ref 6–22)
BUN: 14 mg/dL (ref 7–25)
CO2: 30 mmol/L (ref 20–32)
Calcium: 9.4 mg/dL (ref 8.6–10.4)
Chloride: 108 mmol/L (ref 98–110)
Creat: 1.01 mg/dL — ABNORMAL HIGH (ref 0.60–0.95)
Globulin: 2.7 g/dL (calc) (ref 1.9–3.7)
Glucose, Bld: 82 mg/dL (ref 65–99)
Potassium: 5.3 mmol/L (ref 3.5–5.3)
Sodium: 142 mmol/L (ref 135–146)
Total Bilirubin: 0.5 mg/dL (ref 0.2–1.2)
Total Protein: 6.7 g/dL (ref 6.1–8.1)
eGFR: 55 mL/min/{1.73_m2} — ABNORMAL LOW (ref >=60)

## 2022-10-12 ENCOUNTER — Ambulatory Visit (INDEPENDENT_AMBULATORY_CARE_PROVIDER_SITE_OTHER): Payer: Medicare HMO | Admitting: Family Medicine

## 2022-10-12 DIAGNOSIS — Z23 Encounter for immunization: Secondary | ICD-10-CM

## 2022-10-13 NOTE — Progress Notes (Signed)
, °

## 2022-10-20 ENCOUNTER — Ambulatory Visit: Payer: Medicare HMO | Admitting: Family Medicine

## 2022-11-07 ENCOUNTER — Ambulatory Visit: Payer: Medicare HMO | Admitting: Family Medicine

## 2022-11-16 ENCOUNTER — Ambulatory Visit: Payer: Medicare HMO | Admitting: Psychiatry

## 2022-11-23 ENCOUNTER — Ambulatory Visit (INDEPENDENT_AMBULATORY_CARE_PROVIDER_SITE_OTHER): Payer: Medicare HMO | Admitting: Family Medicine

## 2022-11-23 ENCOUNTER — Telehealth: Payer: Self-pay | Admitting: Family Medicine

## 2022-11-23 ENCOUNTER — Encounter: Payer: Self-pay | Admitting: Family Medicine

## 2022-11-23 VITALS — BP 233/86 | HR 60 | Ht 64.0 in | Wt 151.0 lb

## 2022-11-23 DIAGNOSIS — I10 Essential (primary) hypertension: Secondary | ICD-10-CM | POA: Diagnosis not present

## 2022-11-23 DIAGNOSIS — M79675 Pain in left toe(s): Secondary | ICD-10-CM

## 2022-11-23 DIAGNOSIS — L609 Nail disorder, unspecified: Secondary | ICD-10-CM

## 2022-11-23 DIAGNOSIS — N644 Mastodynia: Secondary | ICD-10-CM | POA: Diagnosis not present

## 2022-11-23 MED ORDER — SPIRONOLACTONE 25 MG PO TABS: 25.0000 mg | ORAL_TABLET | Freq: Every day | ORAL | 1 refills | Status: DC

## 2022-11-23 NOTE — Telephone Encounter (Signed)
Please apply for authorization for brand Bystolic 20 mg after January 1.  She has a significant improvement in blood pressure on the brand.  She has been on generic for the last year and we have not been able to get her blood pressures under control.  She also has multiple intolerances to blood pressure medications including all ACE inhibitor's, atenolol, Cardura, carvedilol, clonidine, diltiazem, hydralazine, metoprolol, losartan, Micardis, verapamil, amlodipine.

## 2022-11-23 NOTE — Progress Notes (Addendum)
Established Patient Office Visit  Subjective   Patient ID: Tina Medina, female    DOB: September 14, 1941  Age: 81 y.o. MRN: [id number]  Chief Complaint  Patient presents with   Follow-up    HPI  Here today for follow-up of hypertension she does not check her blood pressures at home but is taking her hydrochlorothiazide and nebivolol regularly.  Unfortunately her co-pay for the nebivolol is about $45 she is always responded the best to the brand but her insurance denied our request for brand this year so unfortunately we do not get as much blood pressure lowering benefit on the generic.  She has multiple drug intolerances.  Still having the intermittent left nipple pain that feels achy at times sometimes it will bother her for a day or 2 and then it just seems to go away.  She does typically wear support Bortz bra for support.  Denies any trauma or injury.  Mammogram is up-to-date and this has been going on for some time.  He is due for repeat mammo in March.  He also has some vertical nail ridges that she would like me to take a look at today.  Felipa Evener that it could be a sign of kidney or liver issues.  She also is still struggling with some pain in her left great toe she really thinks it is because the second toe is actually curving over top of the great toe.  ROS    Objective:     BP (!) 233/86   Pulse 60   Ht '5\' 4"'$  (1.626 m)   Wt 151 lb (68.5 kg)   BMI 25.92 kg/m    Physical Exam Vitals and nursing note reviewed.  Constitutional:      Appearance: She is well-developed.  HENT:     Head: Normocephalic and atraumatic.  Cardiovascular:     Rate and Rhythm: Normal rate and regular rhythm.     Heart sounds: Normal heart sounds.  Pulmonary:     Effort: Pulmonary effort is normal.     Breath sounds: Normal breath sounds.  Skin:    General: Skin is warm and dry.  Neurological:     Mental Status: She is alert and oriented to person, place, and time.  Psychiatric:         Behavior: Behavior normal.      No results found for any visits on 11/23/22.    The ASCVD Risk score (Arnett DK, et al., 2019) failed to calculate for the following reasons:   The 2019 ASCVD risk score is only valid for ages 18 to 59    Assessment & Plan:   Problem List Items Addressed This Visit       Cardiovascular and Mediastinum   Primary hypertension - Primary    We can try to reapply for brand Bystolic again this years since it was denied last year.  Also she is willing to try adding spironolactone which I think we have not tried previously.  Follow-up in 2 weeks for nurse visit.      Relevant Medications   spironolactone (ALDACTONE) 25 MG tablet   Other Visit Diagnoses     Breast pain, left       Longitudinal nail ridge       Great toe pain, left          Nail ridges-gave reassurance they look benign.  And we do check her kidney and liver regularly.  She just had a kidney checkup last month and  everything looked nice and stable.  Breast pain, left-seems to be intermittent in fact we had evaluated her last year in March and everything came back normal.  She has not had any changes in the discomfort.  We did discuss that if she changes in nature, last longer, is more intense, she notices redness itching swelling or mass to please let me know immediately.  Otherwise we could always repeat her ultrasound and mammogram in March if needed.  Left great toe pain-we discussed that they do make little sleeves and spacers that can hold the toes in place next which others of the second toe is not going over top of the first toe.  Or we could consider a podiatry referral.  She said she rather start with a little sleep first.  These are usually less than $20.  Return in about 2 weeks (around 12/07/2022) for Hypertension - nurse visit .    Beatrice Lecher, MD

## 2022-11-23 NOTE — Assessment & Plan Note (Signed)
We can try to reapply for brand Bystolic again this years since it was denied last year.  Also she is willing to try adding spironolactone which I think we have not tried previously.  Follow-up in 2 weeks for nurse visit.

## 2022-12-07 ENCOUNTER — Ambulatory Visit: Payer: Medicare HMO

## 2022-12-07 ENCOUNTER — Telehealth: Payer: Self-pay

## 2022-12-07 NOTE — Telephone Encounter (Signed)
Initiated Prior authorization EXB:[id number] '20MG'$  tablets (brand name) Via: Covermymeds Case/Key:[id number] Status: cancelled vas of 12/07/21 Reason:Available without authorization.,However pt has a one time fill of this medication for  30/30 with an out of pocket cost  of $95, [employer] states we can do a pa for this medication and it very likely the pt will get an approval,however the co-pay will remain at $95 Spoke  with provider in regards to the request , provider states  that if the pt is comfortable with co-pay amount to proceed  Notified Pt via: Pt was called in regards to this request pt did decline at this time for a pa due to a high co-pay for this medication

## 2022-12-12 ENCOUNTER — Ambulatory Visit (INDEPENDENT_AMBULATORY_CARE_PROVIDER_SITE_OTHER): Payer: Medicare HMO | Admitting: Family Medicine

## 2022-12-12 VITALS — BP 185/67 | HR 55 | Ht 64.0 in | Wt 149.0 lb

## 2022-12-12 DIAGNOSIS — I10 Essential (primary) hypertension: Secondary | ICD-10-CM

## 2022-12-12 NOTE — Progress Notes (Signed)
   Subjective:    Patient ID: Tina Medina, female    DOB: 09-15-1941, 82 y.o.   MRN: [id number]  HPI Pt here for bp check.   Review of Systems     Objective:   Physical Exam        Assessment & Plan:  Pts bp today was 185/67, pt states she is taking nebivolol '20mg'$  bid and HCTZ 12.'5mg'$  occ. Pt states she has discontinued Aldactone '25mg'$  due to stomach irritation. I have added this to allergy list. Pt advised to start taking HCTZ as prescribed and follow up for blood pressure check in 2 weeks.

## 2022-12-12 NOTE — Progress Notes (Signed)
Agree with documentation as above.   Raevon Broom, MD  

## 2022-12-15 ENCOUNTER — Other Ambulatory Visit: Payer: Self-pay | Admitting: Family Medicine

## 2022-12-15 DIAGNOSIS — I10 Essential (primary) hypertension: Secondary | ICD-10-CM

## 2022-12-20 DIAGNOSIS — H52223 Regular astigmatism, bilateral: Secondary | ICD-10-CM | POA: Diagnosis not present

## 2022-12-20 DIAGNOSIS — H43811 Vitreous degeneration, right eye: Secondary | ICD-10-CM | POA: Diagnosis not present

## 2022-12-20 DIAGNOSIS — Z961 Presence of intraocular lens: Secondary | ICD-10-CM | POA: Diagnosis not present

## 2022-12-20 DIAGNOSIS — H524 Presbyopia: Secondary | ICD-10-CM | POA: Diagnosis not present

## 2022-12-26 ENCOUNTER — Ambulatory Visit (INDEPENDENT_AMBULATORY_CARE_PROVIDER_SITE_OTHER): Payer: Medicare HMO | Admitting: Family Medicine

## 2022-12-26 VITALS — BP 175/77 | HR 51 | Ht 64.0 in | Wt 148.0 lb

## 2022-12-26 DIAGNOSIS — I10 Essential (primary) hypertension: Secondary | ICD-10-CM

## 2022-12-26 NOTE — Progress Notes (Signed)
Blood pressure does look better today than it has the last couple times that she has been here even though it is not optimal.  I do think she would benefit from being able to take her hydrochlorothiazide daily.

## 2022-12-26 NOTE — Progress Notes (Signed)
   Subjective:    Patient ID: Tina Medina, female    DOB: 03/13/1941, 82 y.o.   MRN: [id number]  HPI Pt here for bp check, pt states she takes hctz 12.'5mg'$  every other day instead of daily due to frequent urination. Pt states she takes nebivolol 20 mg once daily instead of twice daily due to forgetting.   Review of Systems     Objective:   Physical Exam        Assessment & Plan:  Pts bp today was 175/77,  Pt advised to take bp medication as prescribed without any missed doses and follow up in two weeks for another blood pressure check.

## 2023-01-03 ENCOUNTER — Ambulatory Visit: Payer: Medicare HMO | Admitting: Psychiatry

## 2023-01-03 ENCOUNTER — Encounter: Payer: Self-pay | Admitting: Psychiatry

## 2023-01-03 VITALS — BP 170/77 | HR 58 | Ht 64.0 in | Wt 150.6 lb

## 2023-01-03 DIAGNOSIS — R29898 Other symptoms and signs involving the musculoskeletal system: Secondary | ICD-10-CM | POA: Diagnosis not present

## 2023-01-03 DIAGNOSIS — R519 Headache, unspecified: Secondary | ICD-10-CM

## 2023-01-03 DIAGNOSIS — R6889 Other general symptoms and signs: Secondary | ICD-10-CM | POA: Diagnosis not present

## 2023-01-03 NOTE — Progress Notes (Signed)
Referring:  Owens Loffler, DO [address] [place],  [place] [address]  PCP: Hali Marry, MD  Neurology was asked to evaluate Tina Medina, an 82 year old female for a chief complaint of headaches.  Our recommendations of care will be communicated by shared medical record.    CC:  headaches  History provided from self  HPI:  Medical co-morbidities: left tentorial meningioma, HTN, HLD, lumbar stenosis  The patient presents for evaluation of headaches. States she has had headaches for several years, which can occur on and off for months at a time. They have been more severe recently. She recently had an episode where the pain was so bad she thought she was going to pass out. Headaches are described as shooting pains in her temple, occiput, and vertex. They last for seconds to minutes at a time. There is no associated photophobia, phonophobia, or nausea. She does not take anything for the pain.  She started to notice blurred vision in her left eye in the last 3 months. Went to her eye doctor who noted a normal eye exam and gave her a new prescription for her glasses. She feels her left eye is still blurry despite the new glasses.  She also has pain in her neck which radiates down her arms. They feel numb and weak at times. She will occasionally drop things and struggle to open jars.  She has a history of left tentorial meningioma. Last MRI was 12/14/20 which showed an 8 mm meningioma, stable from 2013.  Headache History: Onset: several years ago Triggers: none Aura: blurred vision in left eye, used to get silver floaters in her right eye but none recently Location: temple, vertex, occiput Quality/Description: shooting pains Associated Symptoms:  Photophobia: no  Phonophobia: no  Nausea: no Other symptoms: vertigo Worse with activity?: no Duration of headaches: seconds to minutes  Current Treatment: Abortive none  Preventative none  Prior  Therapies                                 none   LABS: CBC    Component Value Date/Time   WBC 7.3 07/19/2022 0000   RBC 3.93 07/19/2022 0000   HGB 12.5 07/19/2022 0000   HCT 36.5 07/19/2022 0000   PLT 257 07/19/2022 0000   MCV 92.9 07/19/2022 0000   MCH 31.8 07/19/2022 0000   MCHC 34.2 07/19/2022 0000   RDW 12.3 07/19/2022 0000   LYMPHSABS 2,394 07/19/2022 0000   MONOABS 0.5 08/13/2012 1038   EOSABS 161 07/19/2022 0000   BASOSABS 29 07/19/2022 0000      Latest Ref Rng & Units 10/06/2022   12:00 AM 07/19/2022   12:00 AM 05/17/2022   12:00 AM  CMP  Glucose 65 - 99 mg/dL 82  78  77   BUN 7 - 25 mg/dL '14  15  15   '$ Creatinine 0.60 - 0.95 mg/dL 1.01  1.10  1.11   Sodium 135 - 146 mmol/L 142  143  142   Potassium 3.5 - 5.3 mmol/L 5.3  4.9  4.8   Chloride 98 - 110 mmol/L 108  107  107   CO2 20 - 32 mmol/L '30  29  25   '$ Calcium 8.6 - 10.4 mg/dL 9.4  9.6  9.3   Total Protein 6.1 - 8.1 g/dL 6.7  6.6    Total Bilirubin 0.2 - 1.2 mg/dL  0.5  0.3    AST 10 - 35 U/L 18  17    ALT 6 - 29 U/L 9  8       IMAGING:  MRI L-spine 08/22/22: 1. Mild multifactorial spinal stenosis at L4-5 with left greater than right lateral recess and foraminal narrowing. There is potential left-sided nerve root encroachment at this level. 2. No other significant spinal stenosis or nerve root encroachment. Mild foraminal narrowing at the additional levels as described. Multilevel facet hypertrophy.  MRI brain 12/14/20: 1. 8 mm left tentorial meningioma without change from 2013. 2. Age normal appearance of the brain.  C-spine X-ray 06/2022: multilevel degenerative changes with neural foraminal narrowing at left C3-4, C4-5, C5-6, and C5-7  Imaging independently reviewed on January 03, 2023   Current Outpatient Medications on File Prior to Visit  Medication Sig Dispense Refill   acetaminophen (TYLENOL) 650 MG CR tablet Take 1 tablet (650 mg total) by mouth every 8 (eight) hours as needed for pain. 90  tablet 3   aspirin 81 MG EC tablet Take by mouth.     clobetasol ointment (TEMOVATE) 3.29 % Apply 1 Application topically at bedtime. X 2-3 weeks. 30 g 0   hydrochlorothiazide (MICROZIDE) 12.5 MG capsule Take 1 capsule (12.5 mg total) by mouth daily. 90 capsule 1   Nebivolol HCl 20 MG TABS Take 1 tablet (20 mg total) by mouth 2 (two) times daily. 180 tablet 1   rosuvastatin (CRESTOR) 5 MG tablet Take 1 tablet (5 mg total) by mouth at bedtime. 90 tablet 3   spironolactone (ALDACTONE) 25 MG tablet TAKE 1 TABLET (25 MG TOTAL) BY MOUTH DAILY. 90 tablet 0   No current facility-administered medications on file prior to visit.     Allergies: Allergies  Allergen Reactions   Ace Inhibitors     REACTION: cough Other reaction(s): Abdominal Pain, Chest Pain Patient develops cough, headaches   Aldactone [Spironolactone]     Stomach irritation    Atenolol    Benicar [Olmesartan Medoxomil] Other (See Comments)    Chest pain   Cardura [Doxazosin] Diarrhea   Carvedilol Diarrhea    Complains of diarrhea and chest pain.    Clonidine Derivatives Other (See Comments)   Diltiazem Other (See Comments)    Sharp head ache.    Hydralazine Other (See Comments)    "Chills"    Losartan Other (See Comments)    headache   Metoprolol Diarrhea    Abdominal cramping.    Micardis [Telmisartan] Other (See Comments)    Chest Pain, Back Pain.    Penicillins     REACTION: rash   Verapamil Other (See Comments)    Headaches and constipation   Amlodipine Rash    And Headaches    Family History: Migraine or other headaches in the family:  no Aneurysms in a first degree relative:  no Brain tumors in the family:  no Other neurological illness in the family:   mom had dementia  Past Medical History: Past Medical History:  Diagnosis Date   DDD (degenerative disc disease), lumbar    L spine, spinal stenosis   Gastritis    H Pylori   GERD (gastroesophageal reflux disease)    Hyperlipidemia     Hypertension    Lumbar degenerative disc disease 2002   with stenosis   Meningioma ([place]) 2010   8 mm in the R tentorium cerebelli   Menopause    since age 42   Migraine    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   Thyroid nodule     Past Surgical History Past Surgical History:  Procedure Laterality Date   BREAST BIOPSY Left    BREAST EXCISIONAL BIOPSY Left    BREAST SURGERY  1969   Left   TUBAL LIGATION      Social History: Social History   Tobacco Use   Smoking status: Former    Packs/day: 0.00    Types: Cigarettes    Quit date: 12/05/1968    Years since quitting: 54.1   Smokeless tobacco: Former    Quit date: 12/05/1962   Tobacco comments:    light smoker  Vaping Use   Vaping Use: Never used  Substance Use Topics   Alcohol use: Not Currently    Comment: Occasional glass of wine   Drug use: No    ROS: Negative for fevers, chills. Positive for headache, blurred vision. All other systems reviewed and negative unless stated otherwise in HPI.   Physical Exam:   Vital Signs: BP (!) 170/77 (BP Location: Left Arm, Patient Position: Sitting, Cuff Size: Normal)   Pulse (!) 58   Ht '5\' 4"'$  (1.626 m)   Wt 150 lb 9.6 oz (68.3 kg)   BMI 25.85 kg/m  GENERAL: well appearing,in no acute distress,alert SKIN:  Color, texture, turgor normal. No rashes or lesions HEAD:  Normocephalic/atraumatic. CV:  RRR RESP: Normal respiratory effort MSK: no tenderness to palpation over occiput, neck, or shoulders  NEUROLOGICAL: Mental Status: Alert, oriented to person, place and time,Follows commands Cranial Nerves: PERRL, visual fields intact to confrontation, extraocular movements intact, facial sensation intact, no facial droop or ptosis, hearing grossly intact, no dysarthria Motor: decreased grip strength left>right, otherwise muscle strength 5/5 both upper and lower extremities Reflexes: 2+ throughout Sensation: intact to light touch all 4 extremities Coordination:  Finger-to- nose-finger intact bilaterally Gait: normal-based   IMPRESSION: 82 year old female with a history of left tentorial meningioma, HTN, HLD, lumbar stenosis who presents for evaluation of headaches and blurred vision. ESR/CRP ordered to assess for GCA given new blurred vision. Will order brain MRI to assess for stability of her known meningioma. MRI C-spine ordered as she reports symptoms of cervical radiculopathy including upper extremity numbness and weakness. She is not interested in medication or physical therapy at this time.  PLAN: -ESR/CRP -MRI brain -MRI C-spine -Next steps: consider neck PT, gabapentin, SNRI if pain worsens   I spent a total of 30 minutes chart reviewing and counseling the patient. Headache education was done. Written educational materials and patient instructions outlining all of the above were given.  Follow-up: pending imaging, or if headaches worsen   Genia Harold, MD 01/03/2023   10:48 AM

## 2023-01-04 ENCOUNTER — Telehealth: Payer: Self-pay | Admitting: Psychiatry

## 2023-01-04 LAB — SEDIMENTATION RATE: Sed Rate: 2 mm/hr (ref 0–40)

## 2023-01-04 LAB — C-REACTIVE PROTEIN: CRP: <1 mg/L (ref 0–10)

## 2023-01-04 NOTE — Telephone Encounter (Signed)
Craig Staggers: [id number] exp. 01/04/23-02/03/23 sent to [facility] [phone]

## 2023-01-05 ENCOUNTER — Telehealth: Payer: Self-pay | Admitting: Psychiatry

## 2023-01-05 NOTE — Telephone Encounter (Signed)
Dr.Chima please see the below, patient is scheduled for MRI on 01/16/23. Are you comfortable prescribing Rx?

## 2023-01-05 NOTE — Telephone Encounter (Signed)
Pt is calling. Stated she needs some medication to relax her during her MRI. Pt is requesting medication be sent to [id number]

## 2023-01-06 MED ORDER — LORAZEPAM 0.5 MG PO TABS: ORAL_TABLET | ORAL | 0 refills | Status: DC

## 2023-01-06 NOTE — Telephone Encounter (Signed)
Rx for ativan sent to her pharmacy

## 2023-01-09 NOTE — Telephone Encounter (Signed)
Patient informed below.

## 2023-01-16 ENCOUNTER — Ambulatory Visit (INDEPENDENT_AMBULATORY_CARE_PROVIDER_SITE_OTHER): Payer: Medicare HMO

## 2023-01-16 DIAGNOSIS — R29898 Other symptoms and signs involving the musculoskeletal system: Secondary | ICD-10-CM | POA: Diagnosis not present

## 2023-01-16 DIAGNOSIS — D320 Benign neoplasm of cerebral meninges: Secondary | ICD-10-CM

## 2023-01-16 DIAGNOSIS — M4802 Spinal stenosis, cervical region: Secondary | ICD-10-CM | POA: Diagnosis not present

## 2023-01-16 DIAGNOSIS — D329 Benign neoplasm of meninges, unspecified: Secondary | ICD-10-CM | POA: Diagnosis not present

## 2023-01-16 DIAGNOSIS — M5412 Radiculopathy, cervical region: Secondary | ICD-10-CM

## 2023-01-16 DIAGNOSIS — M5020 Other cervical disc displacement, unspecified cervical region: Secondary | ICD-10-CM | POA: Diagnosis not present

## 2023-01-16 DIAGNOSIS — I739 Peripheral vascular disease, unspecified: Secondary | ICD-10-CM | POA: Diagnosis not present

## 2023-01-16 DIAGNOSIS — M5030 Other cervical disc degeneration, unspecified cervical region: Secondary | ICD-10-CM | POA: Diagnosis not present

## 2023-01-16 DIAGNOSIS — C719 Malignant neoplasm of brain, unspecified: Secondary | ICD-10-CM | POA: Diagnosis not present

## 2023-01-16 MED ORDER — GADOBUTROL 1 MMOL/ML IV SOLN
7.0000 mL | Freq: Once | INTRAVENOUS | Status: AC | PRN
  Administered 2023-01-16: 7 mL via INTRAVENOUS

## 2023-01-17 ENCOUNTER — Other Ambulatory Visit: Payer: Self-pay | Admitting: Psychiatry

## 2023-01-17 ENCOUNTER — Telehealth: Payer: Self-pay | Admitting: Psychiatry

## 2023-01-17 DIAGNOSIS — M4802 Spinal stenosis, cervical region: Secondary | ICD-10-CM

## 2023-01-17 NOTE — Telephone Encounter (Signed)
Referral sent to [facility], phone # [phone].

## 2023-02-01 ENCOUNTER — Telehealth: Payer: Medicare HMO | Admitting: Family Medicine

## 2023-02-08 ENCOUNTER — Other Ambulatory Visit: Payer: Self-pay | Admitting: Pharmacist

## 2023-02-08 NOTE — Patient Instructions (Signed)
Tina Medina,  Thank you for speaking with me today. As discussed, try to take your hydrochlorothiazide on a more consistent basis. We are trying to control your blood pressure as much as possible to prevent heart attacks and strokes.  Below are some helpful tips with checking blood pressure:  Check your blood pressure periodically, and any time you have concerning symptoms like headache, chest pain, dizziness, shortness of breath, or vision changes.   Our goal is less than 140/90.  To appropriately check your blood pressure, make sure you do the following:  1) Avoid caffeine, exercise, or tobacco products for 30 minutes before checking. Empty your bladder. 2) Sit with your back supported in a flat-backed chair. Rest your arm on something flat (arm of the chair, table, etc). 3) Sit still with your feet flat on the floor, resting, for at least 5 minutes.  4) Check your blood pressure. Take 1-2 readings.  5) Write down these readings and bring with you to any provider appointments.  Bring your home blood pressure machine with you to a provider's office for accuracy comparison at least once a year.   Make sure you take your blood pressure medications before you come to any office visit, even if you were asked to fast for labs.   Take care, Luana Shu, PharmD Clinical Pharmacist [facility] [phone]

## 2023-02-08 NOTE — Progress Notes (Signed)
Patient appearing on report for [employer] - Hypertension Control report due to last documented ambulatory blood pressure of 170/77 on 01/03/23. Next appointment with PCP is not scheduled.   Outreached patient to discuss hypertension control and medication management.   Current antihypertensives: hydrochlorothiazide 12.'5mg'$  daily (not consistent), nebivolol '20mg'$  BID   Patient has an automated upper arm home BP machine.  Current blood pressure readings: SBP 160-170s, one time in [id number] systolic   Patient reports hypotensive signs and symptoms including dizziness, lightheadedness.  Patient denies hypertensive symptoms including headache, chest pain, shortness of breath.  Patient reports side effects related to hctz, states she will not take it regularly due to frequent urination    Assessment/Plan: - Currently uncontrolled - - Reviewed goal blood pressure <140/90 - Reviewed appropriate administration of medication regimen - Counseled on long term microvascular and macrovascular complications of uncontrolled hypertension - Discussed dietary modifications, such as reduced salt intake, focus on whole grains, vegetables, lean proteins - Discussed goal of 150 minutes of moderate intensity physical activity weekly - Reviewed strategies to improve medication adherence such as understanding limited options due to side effects she has experienced with blood pressure alternatives - Recommend continue current regimen  Larinda Buttery, PharmD Clinical Pharmacist [facility] [phone]

## 2023-03-20 ENCOUNTER — Encounter: Payer: Self-pay | Admitting: *Deleted

## 2023-03-23 ENCOUNTER — Encounter: Payer: Self-pay | Admitting: Family Medicine

## 2023-03-23 ENCOUNTER — Ambulatory Visit (INDEPENDENT_AMBULATORY_CARE_PROVIDER_SITE_OTHER): Payer: Medicare HMO | Admitting: Family Medicine

## 2023-03-23 VITALS — BP 184/66 | HR 55 | Ht 64.0 in | Wt 152.0 lb

## 2023-03-23 DIAGNOSIS — I209 Angina pectoris, unspecified: Secondary | ICD-10-CM

## 2023-03-23 DIAGNOSIS — E7800 Pure hypercholesterolemia, unspecified: Secondary | ICD-10-CM

## 2023-03-23 DIAGNOSIS — I10 Essential (primary) hypertension: Secondary | ICD-10-CM | POA: Diagnosis not present

## 2023-03-23 NOTE — Assessment & Plan Note (Signed)
[name] possibly getting back in with cardiology.  Last stress test was in 2017 and was negative at that time.  She says she will think about it and let me know.  Given discussed the importance of getting her blood pressure under control.

## 2023-03-23 NOTE — Assessment & Plan Note (Signed)
Encouraged her to be more consistent with taking her statin daily.

## 2023-03-23 NOTE — Assessment & Plan Note (Signed)
[name] is uncontrolled but better.  [name] try to take the 2 tabs of Bystolic together instead of separating it out since she often forgets the second 1.  But she is also been doing it lately for cost reasons.  [name].  I think that is $15 a month which would probably be much reasonable than what she is currently paying.  Also really encouraged her to restart the hydrochlorothiazide.  [name] that sometimes the increased urination can take a couple of weeks to decrease and improve but is really important that we control her blood pressure she also occasionally gets some swelling in her ankles and we discussed that it would help with the swelling she experiences from time to time.  I would be interested to see if once her blood pressure is improved as some of her chest discomfort also improves.

## 2023-03-23 NOTE — Progress Notes (Signed)
Established Patient Office Visit  Subjective   Patient ID: Tina Medina, female    DOB: [birth year]  Age: 82 y.o. MRN: [id number]  Chief Complaint  Patient presents with   Hypothyroidism   Hypertension    HPI   Hypertension- Pt denies chest pain, SOB, dizziness, or heart palpitations.  Taking meds as directed w/o problems.  Denies medication side effects.  She did meet with our clinical pharmacist back in March.  She says she is really like to get [name] once a day in part because of cost.  She also says that she is really not taking the HCTZ when I last saw her she was taking it every other day.  She also is just taking the statin occasionally she is definitely not taking it consistently.  She still having some intermittent mid to left-sided chest pain.  And she brought in a copy of an echocardiogram that she had done about 5 years ago in [place] and had some questions about it.  It mentioned a hypertrophic left ventricle as well as a dilated left atrium.  Also some aortic sclerosis and noted some aortic, mitral and tricuspid regurgitation.  Stress test was about 5 years ago.    ROS    Objective:     BP (!) 184/66   Pulse (!) 55   Ht  (1.626 m)   Wt 152 lb (68.9 kg)   SpO2 100%   BMI 26.09 kg/m    Physical Exam Vitals and nursing note reviewed.  Constitutional:      Appearance: She is well-developed.  HENT:     Head: Normocephalic and atraumatic.  Cardiovascular:     Rate and Rhythm: Normal rate and regular rhythm.     Heart sounds: Normal heart sounds.  Pulmonary:     Effort: Pulmonary effort is normal.     Breath sounds: Normal breath sounds.  Skin:    General: Skin is warm and dry.  Neurological:     Mental Status: She is alert and oriented to person, place, and time.  Psychiatric:        Behavior: Behavior normal.      No results found for any visits on 03/23/23.    The ASCVD Risk score (Arnett DK, et al., 2019) failed to calculate for  the following reasons:   The 2019 ASCVD risk score is only valid for ages 25 to 80    Assessment & Plan:   Problem List Items Addressed This Visit       Cardiovascular and Mediastinum   Primary hypertension - Primary    [name] is uncontrolled but better.  [name] try to take the 2 tabs of Bystolic together instead of separating it out since she often forgets the second 1.  But she is also been doing it lately for cost reasons.  [name].  I think that is $15 a month which would probably be much reasonable than what she is currently paying.  Also really encouraged her to restart the hydrochlorothiazide.  [name] that sometimes the increased urination can take a couple of weeks to decrease and improve but is really important that we control her blood pressure she also occasionally gets some swelling in her ankles and we discussed that it would help with the swelling she experiences from time to time.  I would be interested to see if once  her blood pressure is improved as some of her chest discomfort also improves.      Relevant Orders   COMPLETE METABOLIC PANEL WITH GFR   TSH + free T4   Angina pectoris    [name] possibly getting back in with cardiology.  Last stress test was in 2017 and was negative at that time.  She says she will think about it and let me know.  Given discussed the importance of getting her blood pressure under control.       [name] and discussed how most of the findings are directly related to her uncontrolled hypertension and how again it is really important that we do our best to get it down.  [name].  Do also think there is some memory issues that are affecting her consistency with taking her medication.  No follow-ups on file.    Tina Gasser,  MD

## 2023-03-24 LAB — COMPLETE METABOLIC PANEL WITH GFR
AG Ratio: 1.6 (calc) (ref 1.0–2.5)
ALT: 10 U/L (ref 6–29)
AST: 19 U/L (ref 10–35)
Albumin: 4.2 g/dL (ref 3.6–5.1)
Alkaline phosphatase (APISO): 66 U/L (ref 37–153)
BUN/Creatinine Ratio: 11 (calc) (ref 6–22)
BUN: 12 mg/dL (ref 7–25)
CO2: 30 mmol/L (ref 20–32)
Calcium: 9.4 mg/dL (ref 8.6–10.4)
Chloride: 109 mmol/L (ref 98–110)
Creat: 1.08 mg/dL — ABNORMAL HIGH (ref 0.60–0.95)
Globulin: 2.7 g/dL (calc) (ref 1.9–3.7)
Glucose, Bld: 78 mg/dL (ref 65–99)
Potassium: 4.8 mmol/L (ref 3.5–5.3)
Sodium: 143 mmol/L (ref 135–146)
Total Bilirubin: 0.3 mg/dL (ref 0.2–1.2)
Total Protein: 6.9 g/dL (ref 6.1–8.1)
eGFR: 51 mL/min/{1.73_m2} — ABNORMAL LOW (ref >=60)

## 2023-03-24 LAB — TSH+FREE T4: TSH W/REFLEX TO FT4: 1.16 mIU/L (ref 0.40–4.50)

## 2023-03-24 NOTE — Progress Notes (Signed)
Hi Tina Medina, kidney function is stable at 1.0.  Thyroid level looks good.  Liver function looks good as well.  I would encourage you to try to get your tetanus updated at your local pharmacy.  It is now free with Medicare as long as you are doing it at the pharmacy.

## 2023-04-07 ENCOUNTER — Telehealth: Payer: Self-pay | Admitting: Family Medicine

## 2023-04-07 DIAGNOSIS — L989 Disorder of the skin and subcutaneous tissue, unspecified: Secondary | ICD-10-CM

## 2023-04-07 NOTE — Telephone Encounter (Signed)
Referral, clinical notes and copies of insurance cards have been faxed to [facility] at [phone]. Office will contact patient to schedule referral appointment.

## 2023-04-07 NOTE — Telephone Encounter (Signed)
Received incoming voicemail requesting referral to see Dermatologist. Patient states she has sores in her head. Patient has previously seen Dr. Chancy Milroy at [facility]. Please advise.

## 2023-04-07 NOTE — Telephone Encounter (Signed)
Ok to place ref 

## 2023-04-07 NOTE — Telephone Encounter (Signed)
Referral placed. Patient is aware.  

## 2023-04-13 DIAGNOSIS — L649 Androgenic alopecia, unspecified: Secondary | ICD-10-CM | POA: Diagnosis not present

## 2023-04-13 DIAGNOSIS — L280 Lichen simplex chronicus: Secondary | ICD-10-CM | POA: Diagnosis not present

## 2023-05-24 ENCOUNTER — Other Ambulatory Visit: Payer: Self-pay | Admitting: Family Medicine

## 2023-05-24 DIAGNOSIS — I10 Essential (primary) hypertension: Secondary | ICD-10-CM

## 2023-07-18 ENCOUNTER — Ambulatory Visit (INDEPENDENT_AMBULATORY_CARE_PROVIDER_SITE_OTHER): Payer: Medicare HMO | Admitting: Family Medicine

## 2023-07-18 ENCOUNTER — Encounter: Payer: Self-pay | Admitting: Family Medicine

## 2023-07-18 VITALS — BP 186/64 | HR 59 | Ht 64.0 in | Wt 150.0 lb

## 2023-07-18 DIAGNOSIS — I270 Primary pulmonary hypertension: Secondary | ICD-10-CM | POA: Diagnosis not present

## 2023-07-18 DIAGNOSIS — F5101 Primary insomnia: Secondary | ICD-10-CM

## 2023-07-18 DIAGNOSIS — R208 Other disturbances of skin sensation: Secondary | ICD-10-CM | POA: Diagnosis not present

## 2023-07-18 DIAGNOSIS — L649 Androgenic alopecia, unspecified: Secondary | ICD-10-CM

## 2023-07-18 MED ORDER — ZOLPIDEM TARTRATE 5 MG PO TABS: 2.5000 mg | ORAL_TABLET | Freq: Every evening | ORAL | 1 refills | Status: DC | PRN

## 2023-07-18 NOTE — Assessment & Plan Note (Addendum)
Uncontrolled.  She unfortunately has multiple drug intolerances and has tried multiple antihypertensives including ACE inhibitor's, atenolol, Cardura, carvedilol, clonidine, diltiazem, hydralazine, metoprolol, losartan, Micardis, verapamil, amlodipine.  Them have either caused rashes or headaches or lightheadedness.  Have referred her to cardiology on several occasions in the past to help better control her blood pressures.

## 2023-07-18 NOTE — Progress Notes (Signed)
Acute Office Visit  Subjective:     Patient ID: Tina Medina, female    DOB: 04/24/1941, 82 y.o.   MRN: [id number]  Chief Complaint  Patient presents with   veins in forehead    HPI Patient is in today for sleep issues.  Has tried [place] in the past. Has tried trazodone. Waking up after 4 hours and then can't go back to sleep. Can take up to 2 hours to fall back asleep.  Just feels exhausted all the time because she does not feel like she is to be able to rest.  She has had insomnia on and off for years.  She says she did try Ambien in the past after her back surgery and tolerated it really well she was just on it for maybe a month.  Has prominent veins in forehead.  Note tender or painful  She also has some hypopigmentation on the tops of both ears near the pinna. No irritation, rash or trauma.   Burning in the rectal area esp with passing gas or prolonged sitting. She does have hemorrhoids but says the creams are really no longer working.  ROS      Objective:    BP (!) 186/64   Pulse (!) 59   Ht 5\' 4"  (1.626 m)   Wt 150 lb (68 kg)   SpO2 100%   BMI 25.75 kg/m    Physical Exam Vitals and nursing note reviewed.  Constitutional:      Appearance: She is well-developed.  HENT:     Head: Normocephalic and atraumatic.  Cardiovascular:     Rate and Rhythm: Normal rate and regular rhythm.     Heart sounds: Normal heart sounds.  Pulmonary:     Effort: Pulmonary effort is normal.     Breath sounds: Normal breath sounds.  Skin:    General: Skin is warm and dry.  Neurological:     Mental Status: She is alert and oriented to person, place, and time.  Psychiatric:        Behavior: Behavior normal.     No results found for any visits on 07/18/23.      Assessment & Plan:   Problem List Items Addressed This Visit       Cardiovascular and Mediastinum   Pulmonary hypertension, primary (HCC)    Uncontrolled.  She unfortunately has multiple drug intolerances and has  tried multiple antihypertensives including ACE inhibitor's, atenolol, Cardura, carvedilol, clonidine, diltiazem, hydralazine, metoprolol, losartan, Micardis, verapamil, amlodipine.  Them have either caused rashes or headaches or lightheadedness.  Have referred her to cardiology on several occasions in the past to help better control her blood pressures.        Musculoskeletal and Integument   Androgenic alopecia - Primary    Following with dermatology.        Other   Insomnia    Discussed options and safety.  She has tried Ambien in the past I think it would be reasonable to try low-dose again at this point we did discuss potential safety issues and monitor for any excess sedation and or sleepwalking etc.  Use initially to try to help reset sleep cycle and then as needed.  We did discuss that Medicare often does not pay for this medication.  Tinney work on sleep hygiene pain.      Relevant Medications   zolpidem (AMBIEN) 5 MG tablet   Other Visit Diagnoses     Rectal burning  Relevant Orders   Ambulatory referral to Gastroenterology      She does have some loss of pigmentation on both upper ears but it does not look like there is any rash or chronic irritation.  It is not complete loss of pigment consistent with vitiligo.  Rectal burning-likely from her hemorrhoids will refer to GI for further evaluation she might be a candidate for hemorrhoidectomy or banding.  Meds ordered this encounter  Medications   zolpidem (AMBIEN) 5 MG tablet    Sig: Take 0.5-1 tablets (2.5-5 mg total) by mouth at bedtime as needed for sleep.    Dispense:  20 tablet    Refill:  1    Return in about 2 weeks (around 08/01/2023) for Nurse visit for BP.  I spent 30 minutes on the day of the encounter to include pre-visit record review, face-to-face time with the patient and post visit ordering of test.  Nani Gasser, MD

## 2023-07-18 NOTE — Assessment & Plan Note (Addendum)
Discussed options and safety.  She has tried Ambien in the past I think it would be reasonable to try low-dose again at this point we did discuss potential safety issues and monitor for any excess sedation and or sleepwalking etc.  Use initially to try to help reset sleep cycle and then as needed.  We did discuss that Medicare often does not pay for this medication.  Tinney work on sleep hygiene pain.

## 2023-07-18 NOTE — Assessment & Plan Note (Signed)
Following with dermatology 

## 2023-07-20 DIAGNOSIS — L280 Lichen simplex chronicus: Secondary | ICD-10-CM | POA: Diagnosis not present

## 2023-07-20 DIAGNOSIS — L649 Androgenic alopecia, unspecified: Secondary | ICD-10-CM | POA: Diagnosis not present

## 2023-08-01 ENCOUNTER — Ambulatory Visit: Payer: Medicare HMO | Admitting: Family Medicine

## 2023-08-01 VITALS — BP 184/66 | HR 51 | Temp 98.6°F | Ht 64.0 in | Wt 150.0 lb

## 2023-08-01 DIAGNOSIS — I10 Essential (primary) hypertension: Secondary | ICD-10-CM | POA: Diagnosis not present

## 2023-08-01 NOTE — Progress Notes (Signed)
Patient is here for blood pressure check. Denies trouble sleeping, palpitations, dizziness, lightheadedness, blurry vision, chest pain, shortness of breath, headaches and/or medication problems.   Per provider, patient is to continue Nebivolol 20 mg.   Patient informed to schedule next appointment in mid November for a blood pressure check.

## 2023-08-17 DIAGNOSIS — J439 Emphysema, unspecified: Secondary | ICD-10-CM | POA: Diagnosis not present

## 2023-08-17 DIAGNOSIS — R208 Other disturbances of skin sensation: Secondary | ICD-10-CM | POA: Diagnosis not present

## 2023-08-22 ENCOUNTER — Ambulatory Visit (INDEPENDENT_AMBULATORY_CARE_PROVIDER_SITE_OTHER): Payer: Medicare HMO | Admitting: Family Medicine

## 2023-08-22 DIAGNOSIS — Z0000 Encounter for general adult medical examination without abnormal findings: Secondary | ICD-10-CM | POA: Diagnosis not present

## 2023-08-22 NOTE — Progress Notes (Signed)
MEDICARE ANNUAL WELLNESS VISIT  08/22/2023  Telephone Visit Disclaimer This Medicare AWV was conducted by telephone due to national recommendations for restrictions regarding the COVID-19 Pandemic (e.g. social distancing).  I verified, using two identifiers, that I am speaking with Tina Medina or their authorized healthcare agent. I discussed the limitations, risks, security, and privacy concerns of performing an evaluation and management service by telephone and the potential availability of an in-person appointment in the future. The patient expressed understanding and agreed to proceed.  Location of Patient: Home Location of Provider (nurse):  In the office.  Subjective:    Tina Medina is a 82 y.o. female patient of Metheney, Barbarann Ehlers, MD who had a Medicare Annual Wellness Visit today via telephone. Tina Medina is Retired and lives alone. she has 2 living children. she reports that she is socially active and does interact with friends/family regularly. she is moderately physically active and enjoys going to church and helping seniors at church.  Patient Care Team: Agapito Games, MD as PCP - General (Family Medicine)     08/22/2023    8:32 AM 08/19/2022    9:03 AM 08/13/2021   10:11 AM 07/01/2019    3:08 PM 12/19/2014    9:27 AM  Advanced Directives  Does Patient Have a Medical Advance Directive? Yes Yes Yes Yes Yes  Type of Advance Directive Living will Living will Living will;Healthcare Power of [employer] Power of [place];Living will Healthcare Power of Attorney  Does patient want to make changes to medical advance directive? No - Patient declined No - Patient declined No - Patient declined No - Patient declined No - Patient declined  Copy of Healthcare Power of Attorney in Chart?   No - copy requested No - copy requested Yes    Hospital Utilization Over the Past 12 Months: # of hospitalizations or ER visits: 0 # of surgeries: 0  Review of Systems     Patient reports that her overall health is unchanged compared to last year.  History obtained from chart review and the patient  Patient Reported Readings (BP, Pulse, CBG, Weight, etc) none Per patient no change in vitals since last visit, unable to obtain new vitals due to telehealth visit  Pain Assessment Pain : 0-10 Pain Score: 3  Pain Type: Chronic pain Pain Location: Knee Pain Orientation: Left Pain Descriptors / Indicators: Aching Pain Onset: In the past 7 days Pain Frequency: Intermittent     Current Medications & Allergies (verified) Allergies as of 08/22/2023       Reactions   Ace Inhibitors    REACTION: cough Other reaction(s): Abdominal Pain, Chest Pain Patient develops cough, headaches   Atenolol    Benicar [olmesartan Medoxomil] Other (See Comments)   Chest pain   Cardura [doxazosin] Diarrhea   Carvedilol Diarrhea   Complains of diarrhea and chest pain.    Clonidine Derivatives Other (See Comments)   Diltiazem Other (See Comments)   Sharp head ache.    Hydralazine Other (See Comments)   "Chills"    Losartan Other (See Comments)   headache   Metoprolol Diarrhea   Abdominal cramping.    Micardis [telmisartan] Other (See Comments)   Chest Pain, Back Pain.    Penicillins    REACTION: rash   Verapamil Other (See Comments)   Headaches and constipation   Amlodipine Rash   And Headaches   Spironolactone    Stomach irritation Other Reaction(s): Abdominal Pain        Medication  List        Accurate as of August 22, 2023  8:42 AM. If you have any questions, ask your nurse or doctor.          aspirin EC 81 MG tablet Take by mouth.   clobetasol ointment 0.05 % Commonly known as: TEMOVATE Apply 1 Application topically at bedtime. X 2-3 weeks.   Nebivolol HCl 20 MG Tabs TAKE 1 TABLET BY MOUTH TWICE A DAY   rosuvastatin 5 MG tablet Commonly known as: CRESTOR Take 1 tablet (5 mg total) by mouth at bedtime.   zolpidem 5 MG  tablet Commonly known as: AMBIEN Take 0.5-1 tablets (2.5-5 mg total) by mouth at bedtime as needed for sleep.        History (reviewed): Past Medical History:  Diagnosis Date   Allergy    Cataract    DDD (degenerative disc disease), lumbar    L spine, spinal stenosis   Gastritis    H Pylori   GERD (gastroesophageal reflux disease)    Hyperlipidemia    Hypertension    Lumbar degenerative disc disease 2002   with stenosis   Meningioma (HCC) 2010   8 mm in the R tentorium cerebelli   Menopause    since age 31   Migraine    Osteoma    2 mm of the L lid ethmoid air cells   Thyroid nodule    Past Surgical History:  Procedure Laterality Date   BREAST BIOPSY Left    BREAST EXCISIONAL BIOPSY Left    BREAST SURGERY  1969   Left   TUBAL LIGATION     Family History  Problem Relation Age of Onset   Hyperlipidemia Mother    Hypertension Mother    Diabetes Mother    Arthritis Mother    Prostate cancer Father    Hypertension Brother    Cancer Brother    Social History   Socioeconomic History   Marital status: Widowed    Spouse name: Not on file   Number of children: 3   Years of education: 17   Highest education level: Some college, no degree  Occupational History   Occupation: Retired.      Comment: Retired  Tobacco Use   Smoking status: Former    Current packs/day: 0.00    Types: Cigarettes    Quit date: 12/05/1968    Years since quitting: 54.7   Smokeless tobacco: Former    Quit date: 12/05/1962   Tobacco comments:    light smoker  Vaping Use   Vaping status: Never Used  Substance and Sexual Activity   Alcohol use: Not Currently   Drug use: No   Sexual activity: Not Currently  Other Topics Concern   Not on file  Social History Narrative   Lives alone. She had three children, two living. Her daughters live in [place]. She enjoys decorating in her free time. She does stretching exercises for ten mins everyday. Enjoys going to church.   Social Determinants  of Health   Financial Resource Strain: Medium Risk (08/18/2023)   Overall Financial Resource Strain (CARDIA)    Difficulty of Paying Living Expenses: Somewhat hard  Food Insecurity: Food Insecurity Present (08/18/2023)   Hunger Vital Sign    Worried About Running Out of Food in the Last Year: Sometimes true    Ran Out of Food in the Last Year: Never true  Transportation Needs: No Transportation Needs (08/18/2023)   PRAPARE - Administrator, Civil Service (  Medical): No    Lack of Transportation (Non-Medical): No  Physical Activity: Insufficiently Active (08/18/2023)   Exercise Vital Sign    Days of Exercise per Week: 1 day    Minutes of Exercise per Session: 30 min  Stress: Stress Concern Present (08/18/2023)   [employer] of Occupational Health - Occupational Stress Questionnaire    Feeling of Stress : To some extent  Social Connections: Moderately Integrated (08/22/2023)   Social Connection and Isolation Panel [NHANES]    Frequency of Communication with Friends and Family: More than three times a week    Frequency of Social Gatherings with Friends and Family: Once a week    Attends Religious Services: More than 4 times per year    Active Member of [employer] or Organizations: Yes    Attends Banker Meetings: 1 to 4 times per year    Marital Status: Widowed    Activities of Daily Living    08/18/2023   10:46 AM  In your present state of health, do you have any difficulty performing the following activities:  Hearing? 0  Vision? 1  Difficulty concentrating or making decisions? 1  Walking or climbing stairs? 0  Dressing or bathing? 0  Doing errands, shopping? 1  Preparing Food and eating ? N  Using the Toilet? N  In the past six months, have you accidently leaked urine? Y  Do you have problems with loss of bowel control? N  Managing your Medications? N  Managing your Finances? N  Housekeeping or managing your Housekeeping? N    Patient Education/  Literacy How often do you need to have someone help you when you read instructions, pamphlets, or other written materials from your doctor or pharmacy?: 1 - Never What is the last grade level you completed in school?: one year of college  Exercise    Diet Patient reports consuming 3 meals a day and 2 snack(s) a day Patient reports that her primary diet is: Regular Patient reports that she does have regular access to food.   Depression Screen    08/22/2023    8:33 AM 03/23/2023    8:22 AM 11/23/2022   10:18 AM 08/19/2022    9:04 AM 07/19/2022    2:37 PM 02/16/2022   10:40 AM 01/24/2022    9:00 AM  PHQ 2/9 Scores  PHQ - 2 Score 0 1 0 2 2 0 0  PHQ- 9 Score    9        Fall Risk    08/22/2023    8:33 AM 08/18/2023   10:46 AM 03/23/2023    8:22 AM 12/26/2022   10:47 AM 10/06/2022   11:41 AM  Fall Risk   Falls in the past year? 0 0 0 0 0  Number falls in past yr: 0  0 0 0  Injury with Fall? 0  0 0 0  Risk for fall due to : No Fall Risks  No Fall Risks No Fall Risks Impaired balance/gait;Impaired mobility  Follow up Falls evaluation completed  Falls evaluation completed Falls evaluation completed Falls evaluation completed;Education provided     Objective:  Tina Medina seemed alert and oriented and she participated appropriately during our telephone visit.  Blood Pressure Weight BMI  BP Readings from Last 3 Encounters:  08/01/23 (!) 184/66  07/18/23 (!) 186/64  03/23/23 (!) 184/66   Wt Readings from Last 3 Encounters:  08/01/23 150 lb (68 kg)  07/18/23 150 lb (68 kg)  03/23/23 152  lb (68.9 kg)   BMI Readings from Last 1 Encounters:  08/01/23 25.75 kg/m    *Unable to obtain current vital signs, weight, and BMI due to telephone visit type  Hearing/Vision  Evamae did not seem to have difficulty with hearing/understanding during the telephone conversation Reports that she has had a formal eye exam by an eye care professional within the past year Reports that she has not  had a formal hearing evaluation within the past year *Unable to fully assess hearing and vision during telephone visit type  Cognitive Function:    08/22/2023    8:38 AM 08/19/2022    9:14 AM 08/13/2021   10:30 AM 07/01/2019    3:12 PM  6CIT Screen  What Year? 0 points 0 points 0 points 0 points  What month? 0 points 0 points 0 points 0 points  What time? 0 points 0 points 0 points 0 points  Count back from 20 0 points 0 points 0 points 0 points  Months in reverse 0 points 0 points 0 points 0 points  Repeat phrase 2 points 0 points 0 points 4 points  Total Score 2 points 0 points 0 points 4 points   (Normal:0-7, Significant for Dysfunction: >8)  Normal Cognitive Function Screening: Yes   Immunization & Health Maintenance Record Immunization History  Administered Date(s) Administered   Fluad Quad(high Dose 65+) 08/07/2019, 09/10/2020, 08/04/2021, 10/12/2022   Influenza Split 08/20/2012   Influenza Whole 10/16/2009, 08/12/2011   Influenza, High Dose Seasonal PF 08/30/2018   Influenza,inj,Quad PF,6+ Mos 09/30/2013, 07/28/2014   Moderna Sars-Covid-2 Vaccination 02/06/2020, 03/05/2020, 09/28/2020   Pneumococcal Conjugate-13 05/12/2015   Pneumococcal Polysaccharide-23 02/26/2008   Td 12/05/2004   Zoster, Live 11/05/2013    Health Maintenance  Topic Date Due   DTaP/Tdap/Td (2 - Tdap) 12/05/2014   COVID-19 Vaccine (4 - 2023-24 season) 09/07/2023 (Originally 08/06/2023)   INFLUENZA VACCINE  03/04/2024 (Originally 07/06/2023)   Zoster Vaccines- Shingrix (1 of 2) 06/21/2024 (Originally 01/10/1989)   Medicare Annual Wellness (AWV)  08/21/2024   Pneumonia Vaccine 59+ Years old  Completed   DEXA SCAN  Completed   HPV VACCINES  Aged Out       Assessment  This is a routine wellness examination for [employer].  Health Maintenance: Due or Overdue Health Maintenance Due  Topic Date Due   DTaP/Tdap/Td (2 - Tdap) 12/05/2014    Tina Medina needs a referral for Community  Assistance: Care Management:   Patient needs  referral but patient declined at this time. Social Work:    no Prescription Assistance:  no Nutrition/Diabetes Education:  no   Plan:  Personalized Goals  Goals Addressed               This Visit's Progress     Patient Stated (pt-stated)        Patient stated that she would like to stay healthy.       Personalized Health Maintenance & Screening Recommendations  Influenza vaccine Td vaccine Shingles vaccine  Patient stated that she does not want any of the vaccines.  Lung Cancer Screening Recommended: no (Low Dose CT Chest recommended if Age 21-80 years, 20 pack-year currently smoking OR have quit w/in past 15 years) Hepatitis C Screening recommended: no HIV Screening recommended: no  Advanced Directives: Written information was not prepared per patient's request.  Referrals & Orders No orders of the defined types were placed in this encounter.   Follow-up Plan Follow-up with Agapito Games, MD as  planned Medicare wellness visit in one year.  Patient will access AVS on my chart.   I have personally reviewed and noted the following in the patient's chart:   Medical and social history Use of alcohol, tobacco or illicit drugs  Current medications and supplements Functional ability and status Nutritional status Physical activity Advanced directives List of other physicians Hospitalizations, surgeries, and ER visits in previous 12 months Vitals Screenings to include cognitive, depression, and falls Referrals and appointments  In addition, I have reviewed and discussed with Tina Medina certain preventive protocols, quality metrics, and best practice recommendations. A written personalized care plan for preventive services as well as general preventive health recommendations is available and can be mailed to the patient at her request.      Modesto Charon, RN BSN  08/22/2023

## 2023-08-22 NOTE — Patient Instructions (Addendum)
MEDICARE ANNUAL WELLNESS VISIT Health Maintenance Summary and Written Plan of Care  Tina Medina ,  Thank you for allowing me to perform your Medicare Annual Wellness Visit and for your ongoing commitment to your health.   Health Maintenance & Immunization History Health Maintenance  Topic Date Due   DTaP/Tdap/Td (2 - Tdap) 12/05/2014   COVID-19 Vaccine (4 - 2023-24 season) 09/07/2023 (Originally 08/06/2023)   INFLUENZA VACCINE  03/04/2024 (Originally 07/06/2023)   Zoster Vaccines- Shingrix (1 of 2) 06/21/2024 (Originally 01/10/1989)   Medicare Annual Wellness (AWV)  08/21/2024   Pneumonia Vaccine 65+ Years old  Completed   DEXA SCAN  Completed   HPV VACCINES  Aged Out   Immunization History  Administered Date(s) Administered   Fluad Quad(high Dose 65+) 08/07/2019, 09/10/2020, 08/04/2021, 10/12/2022   Influenza Split 08/20/2012   Influenza Whole 10/16/2009, 08/12/2011   Influenza, High Dose Seasonal PF 08/30/2018   Influenza,inj,Quad PF,6+ Mos 09/30/2013, 07/28/2014   Moderna Sars-Covid-2 Vaccination 02/06/2020, 03/05/2020, 09/28/2020   Pneumococcal Conjugate-13 05/12/2015   Pneumococcal Polysaccharide-23 02/26/2008   Td 12/05/2004   Zoster, Live 11/05/2013    These are the patient goals that we discussed:  Goals Addressed               This Visit's Progress     Patient Stated (pt-stated)        Patient stated that she would like to stay healthy.         This is a list of Health Maintenance Items that are overdue or due now: Health Maintenance Due  Topic Date Due   DTaP/Tdap/Td (2 - Tdap) 12/05/2014   Influenza vaccine Td vaccine Shingles vaccine  Patient stated that she does not want any of the vaccines.  Orders/Referrals Placed Today: No orders of the defined types were placed in this encounter.  (Contact our referral department at [phone] if you have not spoken with someone about your referral appointment within the next 5 days)    Follow-up  Plan Follow-up with Agapito Games, MD as planned Medicare wellness visit in one year.  Patient will access AVS on my chart.      Health Maintenance, Female Adopting a healthy lifestyle and getting preventive care are important in promoting health and wellness. Ask your health care provider about: The right schedule for you to have regular tests and exams. Things you can do on your own to prevent diseases and keep yourself healthy. What should I know about diet, weight, and exercise? Eat a healthy diet  Eat a diet that includes plenty of vegetables, fruits, low-fat dairy products, and lean protein. Do not eat a lot of foods that are high in solid fats, added sugars, or sodium. Maintain a healthy weight Body mass index (BMI) is used to identify weight problems. It estimates body fat based on height and weight. Your health care provider can help determine your BMI and help you achieve or maintain a healthy weight. Get regular exercise Get regular exercise. This is one of the most important things you can do for your health. Most adults should: Exercise for at least 150 minutes each week. The exercise should increase your heart rate and make you sweat (moderate-intensity exercise). Do strengthening exercises at least twice a week. This is in addition to the moderate-intensity exercise. Spend less time sitting. Even light physical activity can be beneficial. Watch cholesterol and blood lipids Have your blood tested for lipids and cholesterol at 84 years of age, then have this test every 5  years. Have your cholesterol levels checked more often if: Your lipid or cholesterol levels are high. You are older than 84 years of age. You are at high risk for heart disease. What should I know about cancer screening? Depending on your health history and family history, you may need to have cancer screening at various ages. This may include screening for: Breast cancer. Cervical  cancer. Colorectal cancer. Skin cancer. Lung cancer. What should I know about heart disease, diabetes, and high blood pressure? Blood pressure and heart disease High blood pressure causes heart disease and increases the risk of stroke. This is more likely to develop in people who have high blood pressure readings or are overweight. Have your blood pressure checked: Every 3-5 years if you are 23-36 years of age. Every year if you are 59 years old or older. Diabetes Have regular diabetes screenings. This checks your fasting blood sugar level. Have the screening done: Once every three years after age 64 if you are at a normal weight and have a low risk for diabetes. More often and at a younger age if you are overweight or have a high risk for diabetes. What should I know about preventing infection? Hepatitis B If you have a higher risk for hepatitis B, you should be screened for this virus. Talk with your health care provider to find out if you are at risk for hepatitis B infection. Hepatitis C Testing is recommended for: Everyone born from [birth year] through 1967. Anyone with known risk factors for hepatitis C. Sexually transmitted infections (STIs) Get screened for STIs, including gonorrhea and chlamydia, if: You are sexually active and are younger than 84 years of age. You are older than 84 years of age and your health care provider tells you that you are at risk for this type of infection. Your sexual activity has changed since you were last screened, and you are at increased risk for chlamydia or gonorrhea. Ask your health care provider if you are at risk. Ask your health care provider about whether you are at high risk for HIV. Your health care provider may recommend a prescription medicine to help prevent HIV infection. If you choose to take medicine to prevent HIV, you should first get tested for HIV. You should then be tested every 3 months for as long as you are taking the  medicine. Pregnancy If you are about to stop having your period (premenopausal) and you may become pregnant, seek counseling before you get pregnant. Take 400 to 800 micrograms (mcg) of folic acid every day if you become pregnant. Ask for birth control (contraception) if you want to prevent pregnancy. Osteoporosis and menopause Osteoporosis is a disease in which the bones lose minerals and strength with aging. This can result in bone fractures. If you are 57 years old or older, or if you are at risk for osteoporosis and fractures, ask your health care provider if you should: Be screened for bone loss. Take a calcium or vitamin D supplement to lower your risk of fractures. Be given hormone replacement therapy (HRT) to treat symptoms of menopause. Follow these instructions at home: Alcohol use Do not drink alcohol if: Your health care provider tells you not to drink. You are pregnant, may be pregnant, or are planning to become pregnant. If you drink alcohol: Limit how much you have to: 0-1 drink a day. Know how much alcohol is in your drink. In the [place] one drink equals one 12 oz bottle of beer (355 mL), one  5 oz glass of wine (148 mL), or one 1 oz glass of hard liquor (44 mL). Lifestyle Do not use any products that contain nicotine or tobacco. These products include cigarettes, chewing tobacco, and vaping devices, such as e-cigarettes. If you need help quitting, ask your health care provider. Do not use street drugs. Do not share needles. Ask your health care provider for help if you need support or information about quitting drugs. General instructions Schedule regular health, dental, and eye exams. Stay current with your vaccines. Tell your health care provider if: You often feel depressed. You have ever been abused or do not feel safe at home. Summary Adopting a healthy lifestyle and getting preventive care are important in promoting health and wellness. Follow your health care  provider's instructions about healthy diet, exercising, and getting tested or screened for diseases. Follow your health care provider's instructions on monitoring your cholesterol and blood pressure. This information is not intended to replace advice given to you by your health care provider. Make sure you discuss any questions you have with your health care provider. Document Revised: 04/12/2021 Document Reviewed: 04/12/2021 Elsevier Patient Education  2024 [employer].

## 2023-08-29 ENCOUNTER — Telehealth: Payer: Self-pay

## 2023-08-29 NOTE — Telephone Encounter (Signed)
Patient came into office to drop off her Disability Parking Placard form to be completed by PCP, forms placed in Dr. Ashley Royalty box, thanks.

## 2023-09-01 NOTE — Telephone Encounter (Signed)
Signed and placed in tony B box.

## 2023-09-01 NOTE — Telephone Encounter (Signed)
Form placed in Dr. Shelah Lewandowsky box awaiting her signature. Will call pt once this is done.

## 2023-09-01 NOTE — Telephone Encounter (Signed)
Tina Medina. Made copy of this and gave to front office for them to contact patient to pick up form

## 2023-11-01 ENCOUNTER — Encounter: Payer: Self-pay | Admitting: Family Medicine

## 2023-11-07 ENCOUNTER — Ambulatory Visit (INDEPENDENT_AMBULATORY_CARE_PROVIDER_SITE_OTHER): Payer: Medicare HMO | Admitting: Family Medicine

## 2023-11-07 ENCOUNTER — Encounter: Payer: Self-pay | Admitting: Family Medicine

## 2023-11-07 VITALS — BP 162/68 | HR 60 | Ht 62.0 in | Wt 152.0 lb

## 2023-11-07 DIAGNOSIS — E785 Hyperlipidemia, unspecified: Secondary | ICD-10-CM

## 2023-11-07 DIAGNOSIS — F5101 Primary insomnia: Secondary | ICD-10-CM

## 2023-11-07 DIAGNOSIS — I10 Essential (primary) hypertension: Secondary | ICD-10-CM

## 2023-11-07 DIAGNOSIS — M8580 Other specified disorders of bone density and structure, unspecified site: Secondary | ICD-10-CM | POA: Diagnosis not present

## 2023-11-07 DIAGNOSIS — J3489 Other specified disorders of nose and nasal sinuses: Secondary | ICD-10-CM

## 2023-11-07 DIAGNOSIS — E7800 Pure hypercholesterolemia, unspecified: Secondary | ICD-10-CM

## 2023-11-07 DIAGNOSIS — N1831 Chronic kidney disease, stage 3a: Secondary | ICD-10-CM | POA: Diagnosis not present

## 2023-11-07 DIAGNOSIS — N644 Mastodynia: Secondary | ICD-10-CM

## 2023-11-07 MED ORDER — ROSUVASTATIN CALCIUM 5 MG PO TABS
5.0000 mg | ORAL_TABLET | Freq: Every day | ORAL | 3 refills | Status: AC
Start: 2023-11-07 — End: ?

## 2023-11-07 MED ORDER — MUPIROCIN 2 % EX OINT
TOPICAL_OINTMENT | CUTANEOUS | 0 refills | Status: DC
Start: 2023-11-07 — End: 2024-05-13

## 2023-11-07 MED ORDER — NEBIVOLOL HCL 20 MG PO TABS
1.0000 | ORAL_TABLET | Freq: Two times a day (BID) | ORAL | 1 refills | Status: DC
Start: 2023-11-07 — End: 2024-05-13

## 2023-11-07 NOTE — Assessment & Plan Note (Signed)
Due to recheck lipids. 

## 2023-11-07 NOTE — Assessment & Plan Note (Signed)
Need to monitor renal function every 6 months.

## 2023-11-07 NOTE — Assessment & Plan Note (Signed)
Due for repeat DEXA. 

## 2023-11-07 NOTE — Assessment & Plan Note (Signed)
She reports she is taking her nebivolol regularly she has had multiple intolerances to multiple medications and we have even referred her to the hypertension clinic.  She does not really want to try anything new today.

## 2023-11-07 NOTE — Progress Notes (Signed)
Established Patient Office Visit  Subjective   Patient ID: Tina Medina, female    DOB: 05-Jul-1941  Age: 82 y.o. MRN: [id number]  Chief Complaint  Patient presents with   Hypertension    HPI  Hypertension- Pt denies chest pain, SOB, dizziness, or heart palpitations.  Taking meds as directed w/o problems.  Denies medication side effects.    Having some breast pain.  Last mammo was 05/2022.  She says that it started when she started taking a prescriber the dermatologist had recommended.  She stopped the medication about a week ago but still having some intermittent breast pain.  The breast pain on the right was just 1 episode but it was quite sharp and it was mostly at the base of the breast going up to the tip of the nipple.  She had the flu last week and says she is feeling a lot better overall she still has a sore in her right nostril that keeps bleeding it started when she was sick.    ROS    Objective:     BP (!) 162/68   Pulse 60   Ht 5\' 2"  (1.575 m)   Wt 152 lb (68.9 kg)   SpO2 100%   BMI 27.80 kg/m    Physical Exam Vitals and nursing note reviewed. Exam conducted with a chaperone present.  Constitutional:      Appearance: Normal appearance.  HENT:     Head: Normocephalic and atraumatic.     Nose:     Comments: Have a sore in the right nostril that is actively bleeding. Eyes:     Conjunctiva/sclera: Conjunctivae normal.  Cardiovascular:     Rate and Rhythm: Normal rate and regular rhythm.  Pulmonary:     Effort: Pulmonary effort is normal.     Breath sounds: Normal breath sounds.  Chest:       Comments: Breast is normal with no mass lesions or swelling no nipple discharge. There is a palpable 8 mm oval-shaped lesion at the 10 o'clock position about a centimeter from the edge of the areola.  There is also some fullness of the tissue behind the nipple and going inferiorly. Skin:    General: Skin is warm and dry.  Neurological:     Mental Status: She is  alert.  Psychiatric:        Mood and Affect: Mood normal.      No results found for any visits on 11/07/23.    The ASCVD Risk score (Arnett DK, et al., 2019) failed to calculate for the following reasons:   The 2019 ASCVD risk score is only valid for ages 32 to 50    Assessment & Plan:   Problem List Items Addressed This Visit       Cardiovascular and Mediastinum   Primary hypertension - Primary    She reports she is taking her nebivolol regularly she has had multiple intolerances to multiple medications and we have even referred her to the hypertension clinic.  She does not really want to try anything new today.      Relevant Medications   rosuvastatin (CRESTOR) 5 MG tablet   Nebivolol HCl 20 MG TABS   Other Relevant Orders   CMP14+EGFR   Lipid panel   CBC     Musculoskeletal and Integument   Osteopenia    Due for repeat DEXA      Relevant Orders   DG Bone Density     Genitourinary   CKD (chronic  kidney disease) stage 3, GFR 30-59 ml/min (HCC)    Need to monitor renal function every 6 months.        Other   Insomnia    Is off the Ambien completely.  Going to remove from medication list.      Hyperlipidemia    Due to recheck lipids.      Relevant Medications   rosuvastatin (CRESTOR) 5 MG tablet   Nebivolol HCl 20 MG TABS   Other Relevant Orders   CMP14+EGFR   Lipid panel   CBC   Other Visit Diagnoses     Breast pain       Relevant Orders   MM 3D DIAGNOSTIC MAMMOGRAM BILATERAL BREAST   US BREAST COMPLETE UNI RIGHT INC AXILLA   US BREAST COMPLETE UNI LEFT INC AXILLA   Essential hypertension, benign       Relevant Medications   rosuvastatin (CRESTOR) 5 MG tablet   Nebivolol HCl 20 MG TABS   Nasal sore       Relevant Medications   mupirocin ointment (BACTROBAN) 2 %       Nasal sore-keep it moisturized with mupirocin ointment.  Run coolmist humidifier to keep the mucosa moist.  Breast Pain-we will go ahead and order diagnostic mammogram  and imaging with ultrasound for further workup.  Return in about 6 months (around 05/07/2024) for Hypertension.    Nani Gasser, MD

## 2023-11-07 NOTE — Assessment & Plan Note (Signed)
Is off the Ambien completely.  Going to remove from medication list.

## 2023-11-08 LAB — CMP14+EGFR
ALT: 9 [IU]/L (ref 0–32)
AST: 20 [IU]/L (ref 0–40)
Albumin: 4.2 g/dL (ref 3.7–4.7)
Alkaline Phosphatase: 88 [IU]/L (ref 44–121)
BUN/Creatinine Ratio: 10 — ABNORMAL LOW (ref 12–28)
BUN: 11 mg/dL (ref 8–27)
Bilirubin Total: 0.4 mg/dL (ref 0.0–1.2)
CO2: 25 mmol/L (ref 20–29)
Calcium: 9.8 mg/dL (ref 8.7–10.3)
Chloride: 108 mmol/L — ABNORMAL HIGH (ref 96–106)
Creatinine, Ser: 1.08 mg/dL — ABNORMAL HIGH (ref 0.57–1.00)
Globulin, Total: 2.6 g/dL (ref 1.5–4.5)
Glucose: 71 mg/dL (ref 70–99)
Potassium: 4.6 mmol/L (ref 3.5–5.2)
Sodium: 146 mmol/L — ABNORMAL HIGH (ref 134–144)
Total Protein: 6.8 g/dL (ref 6.0–8.5)
eGFR: 51 mL/min/{1.73_m2} — ABNORMAL LOW (ref >59)

## 2023-11-08 LAB — CBC
Hematocrit: 39.9 % (ref 34.0–46.6)
Hemoglobin: 13.1 g/dL (ref 11.1–15.9)
MCH: 31.9 pg (ref 26.6–33.0)
MCHC: 32.8 g/dL (ref 31.5–35.7)
MCV: 97 fL (ref 79–97)
Platelets: 258 10*3/uL (ref 150–450)
RBC: 4.11 x10E6/uL (ref 3.77–5.28)
RDW: 12.3 % (ref 11.7–15.4)
WBC: 8.2 10*3/uL (ref 3.4–10.8)

## 2023-11-08 LAB — LIPID PANEL
Chol/HDL Ratio: 3.8 {ratio} (ref 0.0–4.4)
Cholesterol, Total: 183 mg/dL (ref 100–199)
HDL: 48 mg/dL (ref >39)
LDL Chol Calc (NIH): 114 mg/dL — ABNORMAL HIGH (ref 0–99)
Triglycerides: 114 mg/dL (ref 0–149)
VLDL Cholesterol Cal: 21 mg/dL (ref 5–40)

## 2023-11-08 NOTE — Progress Notes (Signed)
Your lab work is within acceptable range and there are no concerning findings.   ?

## 2023-11-10 ENCOUNTER — Telehealth (HOSPITAL_BASED_OUTPATIENT_CLINIC_OR_DEPARTMENT_OTHER): Payer: Self-pay | Admitting: Family Medicine

## 2023-11-13 DIAGNOSIS — L811 Chloasma: Secondary | ICD-10-CM | POA: Diagnosis not present

## 2023-11-13 DIAGNOSIS — L280 Lichen simplex chronicus: Secondary | ICD-10-CM | POA: Diagnosis not present

## 2023-11-20 ENCOUNTER — Ambulatory Visit
Admission: RE | Admit: 2023-11-20 | Discharge: 2023-11-20 | Disposition: A | Discharge status: Home / Self Care | Payer: Medicare HMO | Source: Ambulatory Visit | Attending: Family Medicine | Admitting: Family Medicine

## 2023-11-20 ENCOUNTER — Ambulatory Visit: Payer: Medicare HMO

## 2023-11-20 DIAGNOSIS — N644 Mastodynia: Secondary | ICD-10-CM

## 2023-11-20 DIAGNOSIS — R6889 Other general symptoms and signs: Secondary | ICD-10-CM | POA: Diagnosis not present

## 2023-11-21 DIAGNOSIS — H02413 Mechanical ptosis of bilateral eyelids: Secondary | ICD-10-CM | POA: Diagnosis not present

## 2023-11-21 DIAGNOSIS — H52223 Regular astigmatism, bilateral: Secondary | ICD-10-CM | POA: Diagnosis not present

## 2023-11-21 DIAGNOSIS — Z961 Presence of intraocular lens: Secondary | ICD-10-CM | POA: Diagnosis not present

## 2023-11-21 DIAGNOSIS — H1045 Other chronic allergic conjunctivitis: Secondary | ICD-10-CM | POA: Diagnosis not present

## 2023-11-21 DIAGNOSIS — H04123 Dry eye syndrome of bilateral lacrimal glands: Secondary | ICD-10-CM | POA: Diagnosis not present

## 2023-11-22 ENCOUNTER — Ambulatory Visit: Payer: Medicare HMO

## 2023-11-22 DIAGNOSIS — M8580 Other specified disorders of bone density and structure, unspecified site: Secondary | ICD-10-CM

## 2023-11-22 DIAGNOSIS — M85832 Other specified disorders of bone density and structure, left forearm: Secondary | ICD-10-CM | POA: Diagnosis not present

## 2023-11-22 NOTE — Progress Notes (Signed)
Hi Tina Medina,  your bone density test shows score of -1.6 which is consistent with osteopenia, in the mildly thin range.   The current recommendation for osteopenia (mildly thin bones) treatment includes:   #1 calcium-total of 1200 mg of calcium daily.  If you eat a very calcium rich diet you may be able to obtain that without a supplement.  If not, then I recommend calcium 500 mg twice a day.  There are several products over-the-counter such as Caltrate D and Viactiv chews which are great options that contain calcium and vitamin D. #2 vitamin D-recommend 800 international units daily. #3 exercise-recommend 30 minutes of weightbearing exercise 3 days a week.  Resistance training ,such as doing bands and light weights, can be particularly helpful.

## 2023-12-19 ENCOUNTER — Ambulatory Visit: Payer: Self-pay | Admitting: Family Medicine

## 2023-12-19 NOTE — Telephone Encounter (Signed)
 Copied from CRM [id number]. Topic: Clinical - Red Word Triage >> Dec 19, 2023  9:20 AM Joesph PARAS wrote: Red Word that prompted transfer to Nurse Triage: Patient calling to schedule an appointment with PCP for problems with her heart starting recently. States intermittent chest pains and fatigue.   Chief Complaint: intermittent chest pain Symptoms: shortness of breath when chest pain occurs  Frequency: ongoing for 3 weeks becoming for severe Pertinent Negatives: Patient denies pain radiating  Disposition: [x] ED /[] Urgent Care (no appt availability in office) / [] Appointment(In office/virtual)/ []  [facility]/ [] Home Care/ [] Refused Recommended Disposition /[] [facility]/ []  Follow-up with PCP Additional Notes: The patient reported that for the past 3 weeks she has experienced intermittent chest pain on the left side of her chest under her breast.  Yesterday the pain was excruciating 10/10.  She said, it came and went like lightning.  She said when she bends over it's like her breath is taken away during these episodes. She stated that she thought it may be due to a past surgery.  She was referred for a mammogram that was clear so she did not have an ultrasound.  Based on reported symptoms, she was advised to go to the ED for further assessment.  She declined stating that she will go if she feels like she needs to but not now.  Routed to pcp for awareness.   Reason for Disposition  [1] Chest pain (or angina) comes and goes AND [2] is happening more often (increasing in frequency) or getting worse (increasing in severity)  (Exception: Chest pains that last only a few seconds.)  Answer Assessment - Initial Assessment Questions 1. LOCATION: Where does it hurt?       Under left breast; surgery under that breast; mammogram was clear Feels like pressure in that area; bend down, getting up like breath is taken away - short of breath  2. RADIATION: Does the pain go anywhere else?  (e.g., into neck, jaw, arms, back)     No 3. ONSET: When did the chest pain begin? (Minutes, hours or days)      3 weeks  4. PATTERN: Does the pain come and go, or has it been constant since it started?  Does it get worse with exertion?      Intermittently  5. DURATION: How long does it last (e.g., seconds, minutes, hours)     Came like lightening and went away Feels like something is pressing on chest - lasts a couple of days - last week sometime 6. SEVERITY: How bad is the pain?  (e.g., Scale 1-10; mild, moderate, or severe)    - MILD (1-3): doesn't interfere with normal activities     - MODERATE (4-7): interferes with normal activities or awakens from sleep    - SEVERE (8-10): excruciating pain, unable to do any normal activities       10/10 yesterday - came and went quickly  7. CARDIAC RISK FACTORS: Do you have any history of heart problems or risk factors for heart disease? (e.g., angina, prior heart attack; diabetes, high blood pressure, high cholesterol, smoker, or strong family history of heart disease)     Hypertension High cholesterol  Former smoker  8. PULMONARY RISK FACTORS: Do you have any history of lung disease?  (e.g., blood clots in lung, asthma, emphysema, birth control pills)     No  9. CAUSE: What do you think is causing the chest pain?     Due to previous  surgery 1969 10. OTHER SYMPTOMS: Do you have any other symptoms? (e.g., dizziness, nausea, vomiting, sweating, fever, difficulty breathing, cough)       Shortness of breath History of vertigo  Protocols used: Chest Pain-A-AH

## 2024-05-07 ENCOUNTER — Ambulatory Visit: Payer: Medicare HMO | Admitting: Family Medicine

## 2024-05-13 ENCOUNTER — Encounter: Payer: Self-pay | Admitting: Family Medicine

## 2024-05-13 ENCOUNTER — Ambulatory Visit (INDEPENDENT_AMBULATORY_CARE_PROVIDER_SITE_OTHER): Payer: Medicare (Managed Care) | Admitting: Family Medicine

## 2024-05-13 VITALS — BP 162/64 | HR 54 | Ht 62.0 in | Wt 147.0 lb

## 2024-05-13 DIAGNOSIS — I270 Primary pulmonary hypertension: Secondary | ICD-10-CM

## 2024-05-13 DIAGNOSIS — D320 Benign neoplasm of cerebral meninges: Secondary | ICD-10-CM

## 2024-05-13 DIAGNOSIS — I209 Angina pectoris, unspecified: Secondary | ICD-10-CM

## 2024-05-13 DIAGNOSIS — R413 Other amnesia: Secondary | ICD-10-CM | POA: Diagnosis not present

## 2024-05-13 DIAGNOSIS — N1831 Chronic kidney disease, stage 3a: Secondary | ICD-10-CM | POA: Diagnosis not present

## 2024-05-13 DIAGNOSIS — I10 Essential (primary) hypertension: Secondary | ICD-10-CM

## 2024-05-13 MED ORDER — NEBIVOLOL HCL 20 MG PO TABS: 1.0000 | ORAL_TABLET | Freq: Two times a day (BID) | ORAL | 1 refills | Status: AC

## 2024-05-13 NOTE — Assessment & Plan Note (Signed)
 Not currently following with pulm or Cardiology.  Will get UTD echo

## 2024-05-13 NOTE — Assessment & Plan Note (Signed)
 Will get EKG today.  Consider Echo, etc.    Neg stress test in 2017, hx of LVH.

## 2024-05-13 NOTE — Assessment & Plan Note (Addendum)
 05/2024: MOCA score 24/30 - we discussed options of further workup and possible Neurology referral.  She has chronic HTN to could be contributing to some chronic vascular changes.

## 2024-05-13 NOTE — Progress Notes (Signed)
 Established Patient Office Visit  Subjective  Patient ID: Tina Medina, female    DOB: March 27, 1941  Age: 83 y.o. MRN: [id number]  Chief Complaint  Patient presents with   Hypertension    HPI  Hypertension- Pt denies chest pain, SOB, dizziness, or heart palpitations.  Taking meds as directed w/o problems.  Denies medication side effects.  She is currently taking Nebivolol  twice a day.  She we have tried multiple medications and have sent her to cardiology a couple different times to try to get better control of her blood pressure and this is the only medication that she is felt well on.  She does report that she is been having some intermittent midsternal chest pain she says it just usually last for few minutes sometimes it can feel like a heavy "object" or sometimes just the discomfort.  Again usually just lasting for couple minutes and then it goes away not necessarily triggered by activity, eating, or not eating.  She denies any recent GERD or heartburn symptoms recently she denies any palpitations.  She does have some chronic dizziness. The only think that helped was drinking water/.    F/U CKD 3 -no recent changes.  She does have uncontrolled hypertension.  Concerned about memory changes - says she has noticed takes her longer to remember words, etc.  She currently lives alone but planning o moving back to [place]  to be closer to family.    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  Cervical DDD and arhtritis.      ROS    Objective:     BP (!) 162/64   Pulse (!) 54   Ht 5\' 2"  (1.575 m)   Wt 147 lb 0.3 oz (66.7 kg)   SpO2 99%   BMI 26.89 kg/m    Physical Exam Vitals and nursing note reviewed.  Constitutional:      Appearance: Normal appearance.  HENT:     Head: Normocephalic and atraumatic.  Eyes:     Conjunctiva/sclera: Conjunctivae normal.  Cardiovascular:     Rate and Rhythm: Normal rate and  regular rhythm.  Pulmonary:     Effort: Pulmonary effort is normal.     Breath sounds: Normal breath sounds.  Skin:    General: Skin is warm and dry.  Neurological:     Mental Status: She is alert and oriented to person, place, and time.  Psychiatric:        Mood and Affect: Mood normal.        Behavior: Behavior normal.      No results found for any visits on 05/13/24.    The ASCVD Risk score (Arnett DK, et al., 2019) failed to calculate for the following reasons:   The 2019 ASCVD risk score is only valid for ages 100 to 64    Assessment & Plan:   Problem List Items Addressed This Visit       Cardiovascular and Mediastinum   Pulmonary hypertension, primary [facility])   Not currently following with pulm or Cardiology.  Will get UTD echo      Relevant Medications   Nebivolol  HCl 20 MG TABS   Primary hypertension - Primary   Pressure uncontrolled we will recheck after she has been sitting for a few minutes here in the office.  She says she is taking her Nebivolol  twice a day.  Encouraged her to check blood pressure twice a week and bring those in  with her in the next couple of weeks.      Relevant Medications   Nebivolol  HCl 20 MG TABS   Other Relevant Orders   CMP14+EGFR   Urine Microalbumin w/creat. ratio   Hemoglobin A1c   CBC with Differential/Platelet   ECHOCARDIOGRAM COMPLETE   Angina pectoris (HCC)   Will get EKG today.  Consider Echo, etc.    Neg stress test in 2017, hx of LVH.       Relevant Medications   Nebivolol  HCl 20 MG TABS   Other Relevant Orders   CMP14+EGFR   Urine Microalbumin w/creat. 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        Genitourinary   CKD (chronic kidney disease) stage 3, GFR 30-59 ml/min (HCC)   Due to recheck renal function as  well as urine microalbumin.      Relevant Orders   CMP14+EGFR   Urine Microalbumin w/creat. ratio   Hemoglobin A1c   CBC with Differential/Platelet   ECHOCARDIOGRAM COMPLETE     Other   Memory changes   05/2024: MOCA score 24/30 - we discussed options of further workup and possible Neurology referral.  She has chronic HTN to could be contributing to some chronic vascular changes.         Other Visit Diagnoses       Essential hypertension, benign       Relevant Medications   Nebivolol  HCl 20 MG TABS       EKG today shows rate of 54 bpm, NSR, no acute changes.  No change from piror EKG from 2021    Return in about 3 weeks (around 06/03/2024) for Nurse BP Check.    Duaine German, MD

## 2024-05-13 NOTE — Assessment & Plan Note (Addendum)
 Pressure uncontrolled we will recheck after she has been sitting for a few minutes here in the office.  She says she is taking her Nebivolol  twice a day.  Encouraged her to check blood pressure twice a week and bring those in with her in the next couple of weeks.

## 2024-05-13 NOTE — Assessment & Plan Note (Signed)
 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

## 2024-05-13 NOTE — Patient Instructions (Signed)
 Please check blood pressure at home twice a week for the next 2 to 3 weeks and bring that in with you for nurse visit.

## 2024-05-13 NOTE — Assessment & Plan Note (Signed)
 Due to recheck renal function as well as urine microalbumin.

## 2024-05-14 ENCOUNTER — Encounter: Payer: Self-pay | Admitting: Family Medicine

## 2024-05-14 ENCOUNTER — Ambulatory Visit: Payer: Self-pay | Admitting: Family Medicine

## 2024-05-14 LAB — CMP14+EGFR
ALT: 10 IU/L (ref 0–32)
AST: 27 IU/L (ref 0–40)
Albumin: 4.4 g/dL (ref 3.7–4.7)
Alkaline Phosphatase: 83 IU/L (ref 44–121)
BUN/Creatinine Ratio: 10 — ABNORMAL LOW (ref 12–28)
BUN: 11 mg/dL (ref 8–27)
Bilirubin Total: 0.4 mg/dL (ref 0.0–1.2)
CO2: 22 mmol/L (ref 20–29)
Calcium: 10.2 mg/dL (ref 8.7–10.3)
Chloride: 104 mmol/L (ref 96–106)
Creatinine, Ser: 1.12 mg/dL — ABNORMAL HIGH (ref 0.57–1.00)
Globulin, Total: 2.5 g/dL (ref 1.5–4.5)
Glucose: 82 mg/dL (ref 70–99)
Potassium: 5.4 mmol/L — ABNORMAL HIGH (ref 3.5–5.2)
Sodium: 141 mmol/L (ref 134–144)
Total Protein: 6.9 g/dL (ref 6.0–8.5)
eGFR: 48 mL/min/{1.73_m2} — ABNORMAL LOW (ref >59)

## 2024-05-14 LAB — CBC WITH DIFFERENTIAL/PLATELET
Basophils Absolute: 0 10*3/uL (ref 0.0–0.2)
Basos: 0 %
EOS (ABSOLUTE): 0.1 10*3/uL (ref 0.0–0.4)
Eos: 1 %
Hematocrit: 40.1 % (ref 34.0–46.6)
Hemoglobin: 13.5 g/dL (ref 11.1–15.9)
Immature Grans (Abs): 0 10*3/uL (ref 0.0–0.1)
Immature Granulocytes: 0 %
Lymphocytes Absolute: 1.6 10*3/uL (ref 0.7–3.1)
Lymphs: 23 %
MCH: 32.5 pg (ref 26.6–33.0)
MCHC: 33.7 g/dL (ref 31.5–35.7)
MCV: 96 fL (ref 79–97)
Monocytes Absolute: 0.6 10*3/uL (ref 0.1–0.9)
Monocytes: 8 %
Neutrophils Absolute: 4.8 10*3/uL (ref 1.4–7.0)
Neutrophils: 68 %
Platelets: 246 10*3/uL (ref 150–450)
RBC: 4.16 x10E6/uL (ref 3.77–5.28)
RDW: 12.3 % (ref 11.7–15.4)
WBC: 7.1 10*3/uL (ref 3.4–10.8)

## 2024-05-14 LAB — MICROALBUMIN / CREATININE URINE RATIO
Creatinine, Urine: 38.8 mg/dL
Microalb/Creat Ratio: 9 mg/g{creat} (ref 0–29)
Microalbumin, Urine: 3.6 ug/mL

## 2024-05-14 LAB — HEMOGLOBIN A1C
Est. average glucose Bld gHb Est-mCnc: 105 mg/dL
Hgb A1c MFr Bld: 5.3 % (ref 4.8–5.6)

## 2024-05-14 NOTE — Progress Notes (Signed)
 Art Bigness, kidney function is stable at 1.1.  No excess protein in the urine.  A1c looks good at 5.3.  Blood counts normal no sign of anemia or infection.  Have you had a chance to check your blood pressure at home?

## 2024-05-24 NOTE — Addendum Note (Signed)
 Addended by: Doretha Ganja on: 05/24/2024 11:19 AM   Modules accepted: Orders

## 2024-05-30 ENCOUNTER — Other Ambulatory Visit: Payer: Self-pay | Admitting: Family Medicine

## 2024-05-30 DIAGNOSIS — D320 Benign neoplasm of cerebral meninges: Secondary | ICD-10-CM

## 2024-05-30 DIAGNOSIS — N1831 Chronic kidney disease, stage 3a: Secondary | ICD-10-CM

## 2024-05-30 DIAGNOSIS — I10 Essential (primary) hypertension: Secondary | ICD-10-CM

## 2024-05-30 DIAGNOSIS — I209 Angina pectoris, unspecified: Secondary | ICD-10-CM

## 2024-05-30 DIAGNOSIS — I270 Primary pulmonary hypertension: Secondary | ICD-10-CM

## 2024-05-30 DIAGNOSIS — R413 Other amnesia: Secondary | ICD-10-CM

## 2024-06-03 ENCOUNTER — Ambulatory Visit (INDEPENDENT_AMBULATORY_CARE_PROVIDER_SITE_OTHER): Payer: Medicare (Managed Care)

## 2024-06-03 VITALS — BP 230/75 | HR 59 | Ht 62.0 in

## 2024-06-03 DIAGNOSIS — I10 Essential (primary) hypertension: Secondary | ICD-10-CM | POA: Diagnosis not present

## 2024-06-03 NOTE — Progress Notes (Signed)
   Established Patient Office Visit  Subjective   Patient ID: KENTRELL GUETTLER, female    DOB: 11/10/1941  Age: 83 y.o. MRN: [id number]  Chief Complaint  Patient presents with   Hypertension    BP check - nurse visit.     HPI  Hypertension- BP check nurse visit. Patient states she has had some chest discomfort like pressure previously ( she is unsure of date this happened) , had had vision changes of gold/black floaters in left eye ( she will call ophthalmology and make an appoitmetn. She denies shortness of breath, palpitations, headaches, dizziness or medication problems. She states she has had increase stress and in process of moving. Home BP readings = 143/87, 158/81, 154/79, 168/77, 164/73, 165/85, 138/76, 161/75, 142/70, 178/81.   ROS    Objective:     BP (!) 230/75   Pulse (!) 59   Ht 5' 2 (1.575 m)   SpO2 100%   BMI 26.89 kg/m    Physical Exam   No results found for any visits on 06/03/24.    The ASCVD Risk score (Arnett DK, et al., 2019) failed to calculate for the following reasons:   The 2019 ASCVD risk score is only valid for ages 50 to 83    Assessment & Plan:  BP check. Initial reading 230/75 ( with BP machine) second reading 203/71( with BP machine )  Third reading 222/72 ( manual reading but had just returned from walking to bathroom) final reading = 218/82.  Per Dr. Alvan  home readings showing some better results.  Keep a BP log for the next 2 weeks and come in for nurse visit BP check.  Problem List Items Addressed This Visit       Cardiovascular and Mediastinum   Primary hypertension - Primary    Return in about 2 weeks (around 06/17/2024) for nurse visit BP check .    Suzen SHAUNNA Plenty, LPN

## 2024-06-03 NOTE — Patient Instructions (Signed)
 Keep BP log and return in 2 weeks for BP check as nurse visit.

## 2024-06-12 DIAGNOSIS — H4312 Vitreous hemorrhage, left eye: Secondary | ICD-10-CM | POA: Diagnosis not present

## 2024-06-13 DIAGNOSIS — H3562 Retinal hemorrhage, left eye: Secondary | ICD-10-CM | POA: Diagnosis not present

## 2024-06-13 DIAGNOSIS — H35722 Serous detachment of retinal pigment epithelium, left eye: Secondary | ICD-10-CM | POA: Diagnosis not present

## 2024-06-13 DIAGNOSIS — H34832 Tributary (branch) retinal vein occlusion, left eye, with macular edema: Secondary | ICD-10-CM | POA: Diagnosis not present

## 2024-06-13 DIAGNOSIS — Z7689 Persons encountering health services in other specified circumstances: Secondary | ICD-10-CM | POA: Diagnosis not present

## 2024-06-13 DIAGNOSIS — H4312 Vitreous hemorrhage, left eye: Secondary | ICD-10-CM | POA: Diagnosis not present

## 2024-06-13 DIAGNOSIS — H35033 Hypertensive retinopathy, bilateral: Secondary | ICD-10-CM | POA: Diagnosis not present

## 2024-06-17 ENCOUNTER — Ambulatory Visit: Payer: Medicare (Managed Care)

## 2024-06-19 DIAGNOSIS — H34832 Tributary (branch) retinal vein occlusion, left eye, with macular edema: Secondary | ICD-10-CM | POA: Diagnosis not present

## 2024-06-19 DIAGNOSIS — Z7689 Persons encountering health services in other specified circumstances: Secondary | ICD-10-CM | POA: Diagnosis not present

## 2024-06-20 ENCOUNTER — Ambulatory Visit (HOSPITAL_BASED_OUTPATIENT_CLINIC_OR_DEPARTMENT_OTHER): Payer: Medicare (Managed Care)

## 2024-08-06 ENCOUNTER — Encounter: Payer: Self-pay | Admitting: Sports Medicine

## 2024-08-07 ENCOUNTER — Telehealth: Payer: Self-pay | Admitting: Family Medicine

## 2024-08-07 NOTE — Telephone Encounter (Signed)
 Copied from CRM [id number]. Topic: General - Other >> Aug 07, 2024 10:33 AM Susanna ORN wrote: Reason for CRM: Patient called in stating that she forgot to let the clinic know that she no longer resides in [place] . States she moved to [place]  on August 6th.

## 2024-10-03 DIAGNOSIS — I10 Essential (primary) hypertension: Secondary | ICD-10-CM | POA: Diagnosis not present

## 2024-10-03 DIAGNOSIS — N1832 Chronic kidney disease, stage 3b: Secondary | ICD-10-CM | POA: Diagnosis not present

## 2024-10-03 DIAGNOSIS — L309 Dermatitis, unspecified: Secondary | ICD-10-CM | POA: Diagnosis not present

## 2024-10-03 DIAGNOSIS — M488X6 Other specified spondylopathies, lumbar region: Secondary | ICD-10-CM | POA: Diagnosis not present

## 2024-10-03 DIAGNOSIS — R001 Bradycardia, unspecified: Secondary | ICD-10-CM | POA: Diagnosis not present

## 2024-10-03 DIAGNOSIS — Z0001 Encounter for general adult medical examination with abnormal findings: Secondary | ICD-10-CM | POA: Diagnosis not present

## 2024-10-03 DIAGNOSIS — I1310 Hypertensive heart and chronic kidney disease without heart failure, with stage 1 through stage 4 chronic kidney disease, or unspecified chronic kidney disease: Secondary | ICD-10-CM | POA: Diagnosis not present

## 2024-10-03 DIAGNOSIS — H34812 Central retinal vein occlusion, left eye, with macular edema: Secondary | ICD-10-CM | POA: Diagnosis not present

## 2024-10-03 DIAGNOSIS — R0989 Other specified symptoms and signs involving the circulatory and respiratory systems: Secondary | ICD-10-CM | POA: Diagnosis not present

## 2024-10-03 DIAGNOSIS — L280 Lichen simplex chronicus: Secondary | ICD-10-CM | POA: Diagnosis not present

## 2024-10-03 DIAGNOSIS — E7849 Other hyperlipidemia: Secondary | ICD-10-CM | POA: Diagnosis not present

## 2024-10-03 DIAGNOSIS — Z1331 Encounter for screening for depression: Secondary | ICD-10-CM | POA: Diagnosis not present

## 2024-10-07 DIAGNOSIS — R0989 Other specified symptoms and signs involving the circulatory and respiratory systems: Secondary | ICD-10-CM | POA: Diagnosis not present

## 2024-10-07 DIAGNOSIS — I739 Peripheral vascular disease, unspecified: Secondary | ICD-10-CM | POA: Diagnosis not present

## 2024-10-07 DIAGNOSIS — R001 Bradycardia, unspecified: Secondary | ICD-10-CM | POA: Diagnosis not present

## 2024-10-08 ENCOUNTER — Telehealth: Payer: Self-pay

## 2024-10-08 ENCOUNTER — Encounter: Payer: Self-pay | Admitting: Family Medicine

## 2024-10-08 NOTE — Telephone Encounter (Signed)
 Patient has advised, per previous MyChart message 10/08/2024, she is no longer here in [place] she has moved to [place] .

## 2024-10-18 DIAGNOSIS — L308 Other specified dermatitis: Secondary | ICD-10-CM | POA: Diagnosis not present

## 2024-10-18 DIAGNOSIS — L821 Other seborrheic keratosis: Secondary | ICD-10-CM | POA: Diagnosis not present

## 2024-10-18 DIAGNOSIS — Z79899 Other long term (current) drug therapy: Secondary | ICD-10-CM | POA: Diagnosis not present

## 2024-11-11 DIAGNOSIS — M4316 Spondylolisthesis, lumbar region: Secondary | ICD-10-CM | POA: Diagnosis not present

## 2024-11-11 DIAGNOSIS — M1612 Unilateral primary osteoarthritis, left hip: Secondary | ICD-10-CM | POA: Diagnosis not present

## 2024-11-11 DIAGNOSIS — M47816 Spondylosis without myelopathy or radiculopathy, lumbar region: Secondary | ICD-10-CM | POA: Diagnosis not present

## 2024-12-31 ENCOUNTER — Encounter: Payer: Self-pay | Admitting: Family Medicine
